# Patient Record
Sex: Male | Born: 1950 | Race: White | Hispanic: No | Marital: Married | Smoking: Never smoker
Health system: Southern US, Community
[De-identification: ages and names within clinical notes are randomized; demographics above are authoritative.]

## PROBLEM LIST (undated history)

## (undated) DIAGNOSIS — R7303 Prediabetes: Secondary | ICD-10-CM

## (undated) DIAGNOSIS — Z7901 Long term (current) use of anticoagulants: Secondary | ICD-10-CM

## (undated) DIAGNOSIS — I059 Rheumatic mitral valve disease, unspecified: Secondary | ICD-10-CM

## (undated) DIAGNOSIS — C801 Malignant (primary) neoplasm, unspecified: Secondary | ICD-10-CM

## (undated) DIAGNOSIS — J309 Allergic rhinitis, unspecified: Secondary | ICD-10-CM

## (undated) DIAGNOSIS — K219 Gastro-esophageal reflux disease without esophagitis: Secondary | ICD-10-CM

## (undated) DIAGNOSIS — M1990 Unspecified osteoarthritis, unspecified site: Secondary | ICD-10-CM

## (undated) DIAGNOSIS — I4949 Other premature depolarization: Secondary | ICD-10-CM

## (undated) DIAGNOSIS — Z952 Presence of prosthetic heart valve: Secondary | ICD-10-CM

## (undated) DIAGNOSIS — E785 Hyperlipidemia, unspecified: Secondary | ICD-10-CM

## (undated) DIAGNOSIS — I10 Essential (primary) hypertension: Secondary | ICD-10-CM

## (undated) DIAGNOSIS — E119 Type 2 diabetes mellitus without complications: Secondary | ICD-10-CM

## (undated) HISTORY — DX: Long term (current) use of anticoagulants: Z79.01

## (undated) HISTORY — DX: Hyperlipidemia, unspecified: E78.5

## (undated) HISTORY — DX: Rheumatic mitral valve disease, unspecified: I05.9

## (undated) HISTORY — PX: APPENDECTOMY: SHX54

## (undated) HISTORY — DX: Allergic rhinitis, unspecified: J30.9

## (undated) HISTORY — PX: MITRAL VALVE REPLACEMENT: SHX147

## (undated) HISTORY — PX: SHOULDER SURGERY: SHX246

## (undated) HISTORY — DX: Other premature depolarization: I49.49

---
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
## 1999-04-07 HISTORY — PX: MITRAL VALVE REPLACEMENT: SHX147

## 2005-11-15 ENCOUNTER — Ambulatory Visit: Payer: Self-pay | Admitting: Cardiovascular Disease

## 2005-11-16 ENCOUNTER — Ambulatory Visit: Payer: Self-pay | Admitting: Cardiology

## 2005-11-26 ENCOUNTER — Ambulatory Visit: Payer: Self-pay | Admitting: Cardiology

## 2005-12-17 ENCOUNTER — Ambulatory Visit: Payer: Self-pay | Admitting: Cardiology

## 2006-01-06 ENCOUNTER — Ambulatory Visit: Payer: Self-pay | Admitting: Cardiology

## 2006-01-27 ENCOUNTER — Ambulatory Visit: Payer: Self-pay | Admitting: Cardiology

## 2006-02-04 ENCOUNTER — Ambulatory Visit: Payer: Self-pay | Admitting: Cardiovascular Disease

## 2006-02-04 LAB — CONVERTED CEMR LAB
Albumin: 4.1 g/dL (ref 3.5–5.2)
Alkaline Phosphatase: 64 units/L (ref 39–117)
Chol/HDL Ratio, serum: 3.7
LDL Cholesterol: 72 mg/dL (ref 0–99)
Total Bilirubin: 0.8 mg/dL (ref 0.3–1.2)
Triglyceride fasting, serum: 148 mg/dL (ref 0–149)
VLDL: 30 mg/dL (ref 0–40)

## 2006-02-28 ENCOUNTER — Ambulatory Visit: Payer: Self-pay | Admitting: Cardiovascular Disease

## 2006-03-21 ENCOUNTER — Encounter (INDEPENDENT_AMBULATORY_CARE_PROVIDER_SITE_OTHER): Payer: Self-pay | Admitting: Family Medicine

## 2006-03-21 ENCOUNTER — Emergency Department (HOSPITAL_COMMUNITY): Admission: EM | Admit: 2006-03-21 | Discharge: 2006-03-21 | Payer: Self-pay | Admitting: Family Medicine

## 2006-03-23 ENCOUNTER — Encounter (INDEPENDENT_AMBULATORY_CARE_PROVIDER_SITE_OTHER): Payer: Self-pay | Admitting: Family Medicine

## 2006-03-24 ENCOUNTER — Ambulatory Visit: Payer: Self-pay | Admitting: Family Medicine

## 2006-03-28 ENCOUNTER — Encounter: Payer: Self-pay | Admitting: Family Medicine

## 2006-03-28 DIAGNOSIS — I10 Essential (primary) hypertension: Secondary | ICD-10-CM | POA: Insufficient documentation

## 2006-03-28 DIAGNOSIS — J309 Allergic rhinitis, unspecified: Secondary | ICD-10-CM | POA: Insufficient documentation

## 2006-03-28 DIAGNOSIS — I059 Rheumatic mitral valve disease, unspecified: Secondary | ICD-10-CM | POA: Insufficient documentation

## 2006-03-29 ENCOUNTER — Ambulatory Visit: Payer: Self-pay | Admitting: Cardiology

## 2006-03-31 ENCOUNTER — Ambulatory Visit: Payer: Self-pay | Admitting: Family Medicine

## 2006-04-08 ENCOUNTER — Ambulatory Visit: Payer: Self-pay | Admitting: Cardiology

## 2006-05-13 ENCOUNTER — Ambulatory Visit: Payer: Self-pay | Admitting: Cardiology

## 2006-06-03 ENCOUNTER — Ambulatory Visit: Payer: Self-pay | Admitting: Internal Medicine

## 2006-07-04 ENCOUNTER — Ambulatory Visit: Payer: Self-pay | Admitting: Cardiology

## 2006-08-05 ENCOUNTER — Ambulatory Visit: Payer: Self-pay | Admitting: Internal Medicine

## 2006-08-16 ENCOUNTER — Encounter (INDEPENDENT_AMBULATORY_CARE_PROVIDER_SITE_OTHER): Payer: Self-pay | Admitting: Family Medicine

## 2006-08-16 ENCOUNTER — Telehealth (INDEPENDENT_AMBULATORY_CARE_PROVIDER_SITE_OTHER): Payer: Self-pay | Admitting: Family Medicine

## 2006-08-19 ENCOUNTER — Ambulatory Visit: Payer: Self-pay | Admitting: Family Medicine

## 2006-08-19 ENCOUNTER — Telehealth (INDEPENDENT_AMBULATORY_CARE_PROVIDER_SITE_OTHER): Payer: Self-pay | Admitting: Family Medicine

## 2006-08-25 ENCOUNTER — Ambulatory Visit: Payer: Self-pay | Admitting: Cardiology

## 2006-09-05 ENCOUNTER — Ambulatory Visit: Payer: Self-pay | Admitting: Family Medicine

## 2006-09-05 DIAGNOSIS — E785 Hyperlipidemia, unspecified: Secondary | ICD-10-CM

## 2006-09-05 LAB — CONVERTED CEMR LAB
Cholesterol, target level: 200 mg/dL
HDL goal, serum: 40 mg/dL
LDL Goal: 130 mg/dL

## 2006-09-07 ENCOUNTER — Encounter (INDEPENDENT_AMBULATORY_CARE_PROVIDER_SITE_OTHER): Payer: Self-pay | Admitting: Family Medicine

## 2006-09-13 LAB — CONVERTED CEMR LAB
ALT: 20 units/L (ref 0–53)
AST: 21 units/L (ref 0–37)
Albumin: 4.3 g/dL (ref 3.5–5.2)
BUN: 22 mg/dL (ref 6–23)
Basophils Relative: 1 % (ref 0–1)
CO2: 27 meq/L (ref 19–32)
Calcium: 9.3 mg/dL (ref 8.4–10.5)
Chloride: 105 meq/L (ref 96–112)
HDL: 49 mg/dL (ref >39)
Lymphocytes Relative: 30 % (ref 12–46)
MCHC: 33 g/dL (ref 30.0–36.0)
Monocytes Relative: 8 % (ref 3–11)
Neutro Abs: 3.6 10*3/uL (ref 1.7–7.7)
Neutrophils Relative %: 57 % (ref 43–77)
PSA: 3.09 ng/mL (ref 0.10–4.00)
Potassium: 4.7 meq/L (ref 3.5–5.3)
RBC: 5.01 M/uL (ref 4.22–5.81)
TSH: 2.07 microintl units/mL (ref 0.350–5.50)
WBC: 6.3 10*3/uL (ref 4.0–10.5)

## 2006-09-26 ENCOUNTER — Encounter (INDEPENDENT_AMBULATORY_CARE_PROVIDER_SITE_OTHER): Payer: Self-pay | Admitting: Family Medicine

## 2006-09-28 ENCOUNTER — Ambulatory Visit: Payer: Self-pay | Admitting: Cardiology

## 2006-09-29 ENCOUNTER — Encounter (INDEPENDENT_AMBULATORY_CARE_PROVIDER_SITE_OTHER): Payer: Self-pay | Admitting: Family Medicine

## 2006-10-20 ENCOUNTER — Encounter (INDEPENDENT_AMBULATORY_CARE_PROVIDER_SITE_OTHER): Payer: Self-pay | Admitting: Family Medicine

## 2006-10-24 ENCOUNTER — Encounter (INDEPENDENT_AMBULATORY_CARE_PROVIDER_SITE_OTHER): Payer: Self-pay | Admitting: Family Medicine

## 2006-10-31 ENCOUNTER — Encounter (INDEPENDENT_AMBULATORY_CARE_PROVIDER_SITE_OTHER): Payer: Self-pay | Admitting: Family Medicine

## 2006-11-04 ENCOUNTER — Ambulatory Visit: Payer: Self-pay | Admitting: Cardiology

## 2006-11-17 ENCOUNTER — Ambulatory Visit: Payer: Self-pay | Admitting: Cardiovascular Disease

## 2006-11-21 ENCOUNTER — Encounter: Payer: Self-pay | Admitting: Cardiovascular Disease

## 2006-11-21 ENCOUNTER — Ambulatory Visit: Payer: Self-pay

## 2007-01-24 ENCOUNTER — Ambulatory Visit: Payer: Self-pay | Admitting: Cardiovascular Disease

## 2007-01-27 ENCOUNTER — Ambulatory Visit: Payer: Self-pay | Admitting: Internal Medicine

## 2007-02-06 ENCOUNTER — Telehealth (INDEPENDENT_AMBULATORY_CARE_PROVIDER_SITE_OTHER): Payer: Self-pay | Admitting: Family Medicine

## 2007-02-07 ENCOUNTER — Ambulatory Visit: Payer: Self-pay | Admitting: Family Medicine

## 2007-02-08 ENCOUNTER — Ambulatory Visit: Payer: Self-pay | Admitting: Cardiology

## 2007-02-22 ENCOUNTER — Ambulatory Visit: Payer: Self-pay | Admitting: Cardiology

## 2007-03-24 ENCOUNTER — Ambulatory Visit: Payer: Self-pay | Admitting: Cardiology

## 2007-04-17 ENCOUNTER — Ambulatory Visit: Payer: Self-pay | Admitting: Cardiology

## 2007-05-24 ENCOUNTER — Ambulatory Visit: Payer: Self-pay | Admitting: Cardiovascular Disease

## 2007-06-13 ENCOUNTER — Ambulatory Visit: Payer: Self-pay | Admitting: Cardiology

## 2007-07-04 ENCOUNTER — Ambulatory Visit: Payer: Self-pay | Admitting: Cardiology

## 2007-07-06 ENCOUNTER — Ambulatory Visit: Payer: Self-pay | Admitting: Family Medicine

## 2007-08-22 ENCOUNTER — Telehealth (INDEPENDENT_AMBULATORY_CARE_PROVIDER_SITE_OTHER): Payer: Self-pay | Admitting: *Deleted

## 2007-08-24 ENCOUNTER — Ambulatory Visit: Payer: Self-pay | Admitting: Cardiology

## 2007-09-22 ENCOUNTER — Encounter (INDEPENDENT_AMBULATORY_CARE_PROVIDER_SITE_OTHER): Payer: Self-pay | Admitting: Family Medicine

## 2007-09-27 LAB — CONVERTED CEMR LAB
BUN: 24 mg/dL — ABNORMAL HIGH (ref 6–23)
CO2: 24 meq/L (ref 19–32)
Calcium: 9.9 mg/dL (ref 8.4–10.5)
Chloride: 107 meq/L (ref 96–112)
Cholesterol: 135 mg/dL (ref 0–200)
Creatinine, Ser: 1.11 mg/dL (ref 0.40–1.50)
Glucose, Bld: 95 mg/dL (ref 70–99)
HDL: 47 mg/dL (ref >39)
Hemoglobin: 15.3 g/dL (ref 13.0–17.0)
Lymphocytes Relative: 17 % (ref 12–46)
Monocytes Absolute: 0.7 10*3/uL (ref 0.1–1.0)
Monocytes Relative: 8 % (ref 3–12)
Neutro Abs: 6.8 10*3/uL (ref 1.7–7.7)
PSA: 2.81 ng/mL (ref 0.10–4.00)
RBC: 5.22 M/uL (ref 4.22–5.81)
Total CHOL/HDL Ratio: 2.9

## 2007-10-10 ENCOUNTER — Ambulatory Visit: Payer: Self-pay | Admitting: Cardiology

## 2007-10-17 ENCOUNTER — Ambulatory Visit: Payer: Self-pay | Admitting: Cardiovascular Disease

## 2007-10-25 ENCOUNTER — Ambulatory Visit: Payer: Self-pay | Admitting: Family Medicine

## 2007-10-25 ENCOUNTER — Ambulatory Visit: Payer: Self-pay | Admitting: Cardiology

## 2007-10-25 LAB — CONVERTED CEMR LAB
Bilirubin Urine: NEGATIVE
Ketones, urine, test strip: NEGATIVE
WBC Urine, dipstick: NEGATIVE

## 2007-11-10 ENCOUNTER — Ambulatory Visit: Payer: Self-pay | Admitting: Cardiology

## 2007-11-27 ENCOUNTER — Emergency Department (HOSPITAL_COMMUNITY): Admission: EM | Admit: 2007-11-27 | Discharge: 2007-11-27 | Payer: Self-pay | Admitting: Emergency Medicine

## 2007-11-28 ENCOUNTER — Telehealth: Payer: Self-pay | Admitting: Family Medicine

## 2008-01-25 ENCOUNTER — Ambulatory Visit: Payer: Self-pay | Admitting: Cardiology

## 2008-02-08 ENCOUNTER — Ambulatory Visit: Payer: Self-pay | Admitting: Cardiovascular Disease

## 2008-03-07 ENCOUNTER — Ambulatory Visit: Payer: Self-pay | Admitting: Cardiology

## 2008-03-13 ENCOUNTER — Ambulatory Visit: Payer: Self-pay | Admitting: Cardiology

## 2008-04-04 ENCOUNTER — Ambulatory Visit: Payer: Self-pay | Admitting: Cardiology

## 2008-04-24 ENCOUNTER — Ambulatory Visit: Payer: Self-pay | Admitting: Family Medicine

## 2008-04-25 ENCOUNTER — Ambulatory Visit: Payer: Self-pay | Admitting: Cardiology

## 2008-05-02 ENCOUNTER — Encounter (INDEPENDENT_AMBULATORY_CARE_PROVIDER_SITE_OTHER): Payer: Self-pay | Admitting: Family Medicine

## 2008-05-16 ENCOUNTER — Ambulatory Visit: Payer: Self-pay | Admitting: Cardiology

## 2008-05-16 ENCOUNTER — Encounter (INDEPENDENT_AMBULATORY_CARE_PROVIDER_SITE_OTHER): Payer: Self-pay | Admitting: Family Medicine

## 2008-05-21 ENCOUNTER — Encounter (INDEPENDENT_AMBULATORY_CARE_PROVIDER_SITE_OTHER): Payer: Self-pay | Admitting: *Deleted

## 2008-05-21 LAB — CONVERTED CEMR LAB
BUN: 25 mg/dL — ABNORMAL HIGH (ref 6–23)
Basophils Relative: 0 % (ref 0–1)
CO2: 25 meq/L (ref 19–32)
Calcium: 9.3 mg/dL (ref 8.4–10.5)
Chloride: 105 meq/L (ref 96–112)
Cholesterol: 140 mg/dL (ref 0–200)
Creatinine, Ser: 0.96 mg/dL (ref 0.40–1.50)
Glucose, Bld: 113 mg/dL — ABNORMAL HIGH (ref 70–99)
HDL: 49 mg/dL (ref >39)
Hemoglobin: 15.7 g/dL (ref 13.0–17.0)
Lymphocytes Relative: 28 % (ref 12–46)
Lymphs Abs: 1.9 10*3/uL (ref 0.7–4.0)
MCHC: 32 g/dL (ref 30.0–36.0)
Monocytes Absolute: 0.5 10*3/uL (ref 0.1–1.0)
Monocytes Relative: 7 % (ref 3–12)
Neutro Abs: 4 10*3/uL (ref 1.7–7.7)
RBC: 5.44 M/uL (ref 4.22–5.81)
Total CHOL/HDL Ratio: 2.9
Triglycerides: 137 mg/dL (ref <150)

## 2008-06-20 ENCOUNTER — Ambulatory Visit: Payer: Self-pay | Admitting: Cardiology

## 2008-07-18 ENCOUNTER — Ambulatory Visit: Payer: Self-pay | Admitting: Cardiology

## 2008-08-07 DIAGNOSIS — I4949 Other premature depolarization: Secondary | ICD-10-CM

## 2008-08-22 ENCOUNTER — Ambulatory Visit: Payer: Self-pay | Admitting: Cardiology

## 2008-09-23 ENCOUNTER — Ambulatory Visit: Payer: Self-pay | Admitting: Cardiovascular Disease

## 2008-09-23 DIAGNOSIS — Z954 Presence of other heart-valve replacement: Secondary | ICD-10-CM | POA: Insufficient documentation

## 2008-11-18 ENCOUNTER — Encounter: Payer: Self-pay | Admitting: Cardiology

## 2008-11-20 ENCOUNTER — Ambulatory Visit: Payer: Self-pay

## 2008-11-20 ENCOUNTER — Encounter: Payer: Self-pay | Admitting: Cardiology

## 2008-12-02 ENCOUNTER — Encounter: Payer: Self-pay | Admitting: *Deleted

## 2008-12-19 ENCOUNTER — Ambulatory Visit: Payer: Self-pay | Admitting: Cardiology

## 2009-01-07 ENCOUNTER — Telehealth (INDEPENDENT_AMBULATORY_CARE_PROVIDER_SITE_OTHER): Payer: Self-pay | Admitting: *Deleted

## 2009-01-08 ENCOUNTER — Encounter (INDEPENDENT_AMBULATORY_CARE_PROVIDER_SITE_OTHER): Payer: Self-pay | Admitting: Family Medicine

## 2009-01-17 ENCOUNTER — Encounter (INDEPENDENT_AMBULATORY_CARE_PROVIDER_SITE_OTHER): Payer: Self-pay | Admitting: Cardiology

## 2009-02-07 ENCOUNTER — Ambulatory Visit: Payer: Self-pay | Admitting: Cardiology

## 2009-03-06 ENCOUNTER — Ambulatory Visit: Payer: Self-pay | Admitting: Cardiology

## 2009-05-07 ENCOUNTER — Encounter (INDEPENDENT_AMBULATORY_CARE_PROVIDER_SITE_OTHER): Payer: Self-pay | Admitting: Cardiology

## 2009-05-21 ENCOUNTER — Encounter (INDEPENDENT_AMBULATORY_CARE_PROVIDER_SITE_OTHER): Payer: Self-pay | Admitting: Cardiology

## 2009-05-21 ENCOUNTER — Telehealth: Payer: Self-pay | Admitting: Cardiology

## 2009-05-29 ENCOUNTER — Ambulatory Visit: Payer: Self-pay | Admitting: Cardiology

## 2009-05-29 LAB — CONVERTED CEMR LAB: POC INR: 2.8

## 2009-07-03 ENCOUNTER — Ambulatory Visit: Payer: Self-pay | Admitting: Cardiology

## 2009-07-03 LAB — CONVERTED CEMR LAB: POC INR: 2.3

## 2009-07-31 ENCOUNTER — Encounter (INDEPENDENT_AMBULATORY_CARE_PROVIDER_SITE_OTHER): Payer: Self-pay | Admitting: *Deleted

## 2009-08-04 ENCOUNTER — Encounter (INDEPENDENT_AMBULATORY_CARE_PROVIDER_SITE_OTHER): Payer: Self-pay | Admitting: *Deleted

## 2009-08-13 ENCOUNTER — Encounter (INDEPENDENT_AMBULATORY_CARE_PROVIDER_SITE_OTHER): Payer: Self-pay | Admitting: Pharmacist

## 2009-09-08 ENCOUNTER — Ambulatory Visit: Payer: Self-pay | Admitting: Cardiology

## 2009-09-08 LAB — CONVERTED CEMR LAB: POC INR: 2.5

## 2009-09-30 ENCOUNTER — Ambulatory Visit: Payer: Self-pay | Admitting: Cardiovascular Disease

## 2009-10-06 ENCOUNTER — Ambulatory Visit: Payer: Self-pay | Admitting: Cardiovascular Disease

## 2009-11-05 ENCOUNTER — Telehealth (INDEPENDENT_AMBULATORY_CARE_PROVIDER_SITE_OTHER): Payer: Self-pay

## 2009-11-05 ENCOUNTER — Ambulatory Visit: Payer: Self-pay | Admitting: Cardiology

## 2009-11-05 LAB — CONVERTED CEMR LAB: POC INR: 3

## 2009-12-10 ENCOUNTER — Ambulatory Visit: Payer: Self-pay | Admitting: Cardiology

## 2009-12-10 LAB — CONVERTED CEMR LAB: POC INR: 2.7

## 2010-01-07 ENCOUNTER — Ambulatory Visit: Payer: Self-pay | Admitting: Cardiology

## 2010-02-04 ENCOUNTER — Ambulatory Visit: Payer: Self-pay | Admitting: Cardiology

## 2010-03-16 ENCOUNTER — Ambulatory Visit: Payer: Self-pay | Admitting: Cardiology

## 2010-04-22 ENCOUNTER — Ambulatory Visit: Admit: 2010-04-22 | Payer: Self-pay

## 2010-04-23 ENCOUNTER — Encounter (INDEPENDENT_AMBULATORY_CARE_PROVIDER_SITE_OTHER): Payer: Self-pay | Admitting: *Deleted

## 2010-05-01 ENCOUNTER — Ambulatory Visit
Admission: RE | Admit: 2010-05-01 | Discharge: 2010-05-01 | Payer: Self-pay | Source: Home / Self Care | Attending: Cardiology | Admitting: Cardiology

## 2010-05-19 NOTE — Letter (Signed)
Summary: Custom - Delinquent Coumadin 1  [facility]  [address] [place] [address]   Phone: [phone]  Fax: [phone]     August 13, 2009 MRN: [id number]   Luis Haynes [address] [place], [place]  [address]   Dear [name],  This letter is being sent to you as a reminder that it is necessary for you to get your INR/PT checked regularly so that we can optimize your care.  Our records indicate that you were scheduled to have a test done recently.  As of today, we have not received the results of this test.  It is very important that you have your INR checked.  Please call our office at the number listed above to schedule an appointment at your earliest convenience.    If you have recently had your protime checked or have discontinued this medication, please contact our office at the above phone number to clarify this issue.  Thank you for this prompt attention to this important health care matter.  Sincerely, Vashti Hey, RN  [facility] Team

## 2010-05-19 NOTE — Medication Information (Signed)
Summary: ccr-lr  Anticoagulant Therapy  Managed by: Vashti Hey, RN PCP: Franchot Heidelberg, MD Supervising MD: Diona Browner MD, Remi Deter Indication 1: Mitral Valve Replacement (ICD-V43.3) Indication 2: Atrial Fibrillation (ICD-427.31) Lab Used: [facility] [place] Site: [place] INR POC 3.1  Dietary changes: no    Health status changes: no    Bleeding/hemorrhagic complications: no    Recent/future hospitalizations: no    Any changes in medication regimen? no    Recent/future dental: no  Any missed doses?: no       Is patient compliant with meds? yes       Allergies: 1)  ! Sulfa  Anticoagulation Management History:      The patient is taking warfarin and comes in today for a routine follow up visit.  Negative risk factors for bleeding include an age less than 56 years old and no history of CVA/TIA.  The bleeding index is 'low risk'.  Positive CHADS2 values include History of HTN.  Negative CHADS2 values include Age > 14 years old, History of Diabetes, and Prior Stroke/CVA/TIA.  The start date was 04/19/1998.  Anticoagulation responsible provider: Diona Browner MD, Remi Deter.  INR POC: 3.1.  Cuvette Lot#: [id number].  Exp: 11/2010.    Anticoagulation Management Assessment/Plan:      The patient's current anticoagulation dose is Coumadin 5 mg tabs: As directed.  The target INR is 2.5 - 3.5.  The next INR is due 03/09/2010.  Anticoagulation instructions were given to patient.  Results were reviewed/authorized by Vashti Hey, RN.  He was notified by Vashti Hey RN.         Prior Anticoagulation Instructions: INR 2.7 Continue coumadin 5mg  once daily except 7.5mg  on Mondays, Wednesdays and Fridays  Current Anticoagulation Instructions: INR 3.1 Continue coumadin 5mg  once daily except 7.5mg  on Mondays, Wednesdays and Fridays

## 2010-05-19 NOTE — Progress Notes (Signed)
Summary: called re. past due coumadin appt  Phone Note Outgoing Call   Call placed by: Vashti Hey RN Call placed to: Patient Summary of Call: Called pt re. past due INR appts.  No answer.  Left message on machine for pt to call the office to let me know if he is still taking coumadin and who is managing it if he is. Initial call taken by: Vashti Hey RN,  May 21, 2009 1:07 PM

## 2010-05-19 NOTE — Letter (Signed)
Summary: Custom - Delinquent Coumadin 1  [facility]  [address] [place] [address]   Phone: [phone]  Fax: [phone]     May 07, 2009 MRN: [id number]   Luis Haynes [address] [place], [place]  [address]   Dear Mr. VOSLER,  This letter is being sent to you as a reminder that it is necessary for you to get your INR/PT checked regularly so that we can optimize your care.  Our records indicate that you were scheduled to have a test done recently.  As of today, we have not received the results of this test.  It is very important that you have your INR checked.  Please call our office at the number listed above to schedule an appointment at your earliest convenience.    If you have recently had your protime checked or have discontinued this medication, please contact our office at the above phone number to clarify this issue.  Thank you for this prompt attention to this important health care matter.  Sincerely, Vashti Hey RN  [facility] Team   Please let our office know if you are no longer taking coumadin or if it is being managed by another physican so we can update our records.

## 2010-05-19 NOTE — Letter (Signed)
Summary: Appointment - Missed  [facility]  [address] [place] [address]   Phone: [phone]  Fax: [phone]     August 04, 2009 MRN: [id number]   Luis Haynes [address] [place], [place]  [address]   Dear [name],  Our records indicate you missed your appointment on  08/04/09    [facility]     It is very important that we reach you to reschedule this appointment. We look forward to participating in your health care needs. Please contact us at the number listed above at your earliest convenience to reschedule this appointment.     Sincerely,    Glass blower/designer

## 2010-05-19 NOTE — Medication Information (Signed)
Summary: ccr-lr  Anticoagulant Therapy  Managed by: Vashti Hey, RN PCP: Franchot Heidelberg, MD Supervising MD: Diona Browner MD, Remi Deter Indication 1: Mitral Valve Replacement (ICD-V43.3) Indication 2: Atrial Fibrillation (ICD-427.31) Lab Used: [facility] [place] Site: [place] INR POC 2.7  Dietary changes: no    Health status changes: no    Bleeding/hemorrhagic complications: no    Recent/future hospitalizations: no    Any changes in medication regimen? no    Recent/future dental: no  Any missed doses?: no       Is patient compliant with meds? yes       Allergies: 1)  ! Sulfa  Anticoagulation Management History:      The patient is taking warfarin and comes in today for a routine follow up visit.  Negative risk factors for bleeding include an age less than 56 years old and no history of CVA/TIA.  The bleeding index is 'low risk'.  Positive CHADS2 values include History of HTN.  Negative CHADS2 values include Age > 1 years old, History of Diabetes, and Prior Stroke/CVA/TIA.  The start date was 04/19/1998.  Anticoagulation responsible Jakobe Blau: Diona Browner MD, Remi Deter.  INR POC: 2.7.  Cuvette Lot#: [id number].  Exp: 11/2010.    Anticoagulation Management Assessment/Plan:      The patient's current anticoagulation dose is Coumadin 5 mg tabs: As directed.  The target INR is 2.5 - 3.5.  The next INR is due 02/04/2010.  Anticoagulation instructions were given to patient.  Results were reviewed/authorized by Vashti Hey, RN.  He was notified by Vashti Hey RN.         Prior Anticoagulation Instructions: INR 2.7 Continue coumadin 5mg  once daily except 7.5mg  on Mondays, Wednesdays and Fridays  Current Anticoagulation Instructions: Same as Prior Instructions. Prescriptions: COUMADIN 5 MG TABS (WARFARIN SODIUM) As directed  #45 x 3   Entered by:   Vashti Hey RN   Authorized by:   Colon Branch, MD, [facility]   Signed by:   Vashti Hey RN on 01/07/2010   Method  used:   Electronically to        [facility] (retail)       [address]       [place], [place]  [address]       Ph: [phone]       Fax: [phone]   RxID:   [id number]

## 2010-05-19 NOTE — Medication Information (Signed)
Summary: CCR  Anticoagulant Therapy  Managed by: Vashti Hey, RN PCP: Franchot Heidelberg, MD Supervising MD: Diona Browner MD, Remi Deter Indication 1: Mitral Valve Replacement (ICD-V43.3) Indication 2: Atrial Fibrillation (ICD-427.31) Lab Used: [facility] [place] Site: [place] INR POC 2.8  Dietary changes: no    Health status changes: no    Bleeding/hemorrhagic complications: no    Recent/future hospitalizations: no    Any changes in medication regimen? no    Recent/future dental: no  Any missed doses?: no       Is patient compliant with meds? yes       Allergies: No Known Drug Allergies  Anticoagulation Management History:      The patient is taking warfarin and comes in today for a routine follow up visit.  Negative risk factors for bleeding include an age less than 81 years old and no history of CVA/TIA.  The bleeding index is 'low risk'.  Positive CHADS2 values include History of HTN.  Negative CHADS2 values include Age > 72 years old, History of Diabetes, and Prior Stroke/CVA/TIA.  The start date was 04/19/1998.  Anticoagulation responsible provider: Diona Browner MD, Remi Deter.  INR POC: 2.8.  Cuvette Lot#: [id number].  Exp: 10/11.    Anticoagulation Management Assessment/Plan:      The patient's current anticoagulation dose is Coumadin 5 mg tabs: As directed by [facility] 7.5  two days a week and 5mg  5 days.  The target INR is 2.5 - 3.5.  The next INR is due 07/03/2009.  Anticoagulation instructions were given to patient.  Results were reviewed/authorized by Vashti Hey, RN.  He was notified by Vashti Hey RN.         Prior Anticoagulation Instructions: INR 2.4 Take coumadin 1 1/2 tablets today then resume 1 tablet once daily except 1 1/2 tablets on Mondays and Fridays  Current Anticoagulation Instructions: INR 2.8 Continue coumadin 5mg  once daily except 7.5mg  on Mondays and Fridays

## 2010-05-19 NOTE — Letter (Signed)
Summary: Custom - Delinquent Coumadin 2  [facility]  [address] [place] [address]   Phone: [phone]  Fax: [phone]     May 21, 2009 MRN: [id number]   Luis Haynes [address] [place], [place]  [address]   Dear [name],  We have attempted to contact you by phone and letter on multiple occasions to contact our office for important blood work associated with the blood thinner, warfarin (Coumadin).  Warfarin is a very important drug that can cause life threatening side effects including, bleeding, and thus requires close laboratory monitoring.  We are unable to accept responsibility for blood thinner-related health problems you may develop because you have not followed our recommendations for appropriate monitoring.  These may include abnormal bleeding occurrences and/or development of blood clots (stroke, heart attack, blood clots in legs or lungs, etc.).  We need for you to contact this office at the number listed above to schedule and complete this very important blood work.  Thank you for your assistance in this urgent matter.  Sincerely, Vashti Hey RN [facility] Team

## 2010-05-19 NOTE — Medication Information (Signed)
Summary: ccr-lr  Anticoagulant Therapy  Managed by: Vashti Hey, RN PCP: Franchot Heidelberg, MD Supervising MD: Dietrich Pates MD, Molly Maduro Indication 1: Mitral Valve Replacement (ICD-V43.3) Indication 2: Atrial Fibrillation (ICD-427.31) Lab Used: [facility] [place] Site: [place] INR POC 2.3  Dietary changes: no    Health status changes: no    Bleeding/hemorrhagic complications: no    Recent/future hospitalizations: no    Any changes in medication regimen? no    Recent/future dental: no  Any missed doses?: no       Is patient compliant with meds? yes       Allergies: No Known Drug Allergies  Anticoagulation Management History:      The patient is taking warfarin and comes in today for a routine follow up visit.  Negative risk factors for bleeding include an age less than 49 years old and no history of CVA/TIA.  The bleeding index is 'low risk'.  Positive CHADS2 values include History of HTN.  Negative CHADS2 values include Age > 70 years old, History of Diabetes, and Prior Stroke/CVA/TIA.  The start date was 04/19/1998.  Anticoagulation responsible provider: Dietrich Pates MD, Molly Maduro.  INR POC: 2.3.  Cuvette Lot#: [id number].  Exp: 10/11.    Anticoagulation Management Assessment/Plan:      The patient's current anticoagulation dose is Coumadin 5 mg tabs: As directed by [facility]in Clinic 7.5  two days a week and 5mg  5 days.  The target INR is 2.5 - 3.5.  The next INR is due 08/04/2009.  Anticoagulation instructions were given to patient.  Results were reviewed/authorized by Vashti Hey, RN.  He was notified by Vashti Hey RN.         Prior Anticoagulation Instructions: INR 2.8 Continue coumadin 5mg  once daily except 7.5mg  on Mondays and Fridays  Current Anticoagulation Instructions: INR 2.3 Take coumadin 2 tablets tonight and tomorrow night then resume 1 tablet once daily except 1 1/2 on Mondays and Fridays

## 2010-05-19 NOTE — Medication Information (Signed)
Summary: rov/sp   Anticoagulant Therapy  Managed by: Vashti Hey, RN PCP: Franchot Heidelberg, MD Supervising MD: Daleen Squibb MD, Maisie Fus Indication 1: Mitral Valve Replacement (ICD-V43.3) Indication 2: Atrial Fibrillation (ICD-427.31) Lab Used: [facility] [place] Site: [place] INR POC 3.0  Dietary changes: no    Health status changes: no    Bleeding/hemorrhagic complications: no    Recent/future hospitalizations: no    Any changes in medication regimen? no    Recent/future dental: no  Any missed doses?: no       Is patient compliant with meds? yes       Allergies: 1)  ! Sulfa  Anticoagulation Management History:      The patient is taking warfarin and comes in today for a routine follow up visit.  Negative risk factors for bleeding include an age less than 66 years old and no history of CVA/TIA.  The bleeding index is 'low risk'.  Positive CHADS2 values include History of HTN.  Negative CHADS2 values include Age > 11 years old, History of Diabetes, and Prior Stroke/CVA/TIA.  The start date was 04/19/1998.  Anticoagulation responsible provider: Daleen Squibb MD, Maisie Fus.  INR POC: 3.0.  Cuvette Lot#: [id number].  Exp: 11/2010.    Anticoagulation Management Assessment/Plan:      The patient's current anticoagulation dose is Coumadin 5 mg tabs: As directed.  The target INR is 2.5 - 3.5.  The next INR is due 12/10/2009.  Anticoagulation instructions were given to patient.  Results were reviewed/authorized by Vashti Hey, RN.  He was notified by Vashti Hey RN.         Prior Anticoagulation Instructions: INR 3.5  Continue same dose of 1 tablet every day except 1 1/2 tablets on Monday, Wednesday and Friday.  Eat an extra serving of greens today.   Current Anticoagulation Instructions: INR 3.0 Continue coumadin 5mg  once daily except 7.5mg  on Mondays, Wednesdays and Fridays

## 2010-05-19 NOTE — Medication Information (Signed)
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 no    Recent/future dental: no  Any missed doses?: no       Is patient compliant with meds? yes       Allergies: 1)  ! Sulfa  Anticoagulation Management History:      The patient is taking warfarin and comes in today for a routine follow up visit.  Negative risk factors for bleeding include an age less than 52 years old and no history of CVA/TIA.  The bleeding index is 'low risk'.  Positive CHADS2 values include History of HTN.  Negative CHADS2 values include Age > 31 years old, History of Diabetes, and Prior Stroke/CVA/TIA.  The start date was 04/19/1998.  Anticoagulation responsible provider: Diona Browner MD, Remi Deter.  INR POC: 2.7.  Cuvette Lot#: [id number].  Exp: 11/2010.    Anticoagulation Management Assessment/Plan:      The patient's current anticoagulation dose is Coumadin 5 mg tabs: As directed.  The target INR is 2.5 - 3.5.  The next INR is due 01/07/2010.  Anticoagulation instructions were given to patient.  Results were reviewed/authorized by Vashti Hey, RN.  He was notified by Vashti Hey RN.         Prior Anticoagulation Instructions: INR 3.0 Continue coumadin 5mg  once daily except 7.5mg  on Mondays, Wednesdays and Fridays  Current Anticoagulation Instructions: INR 2.7 Continue coumadin 5mg  once daily except 7.5mg  on Mondays, Wednesdays and Fridays

## 2010-05-19 NOTE — Medication Information (Signed)
Summary: CCR  Anticoagulant Therapy  Managed by: Vashti Hey, RN PCP: Franchot Heidelberg, MD Supervising MD: Dietrich Pates MD, Molly Maduro Indication 1: Mitral Valve Replacement (ICD-V43.3) Indication 2: Atrial Fibrillation (ICD-427.31) Lab Used: [facility] [place] Site: [place] INR POC 2.5  Dietary changes: no    Health status changes: no    Bleeding/hemorrhagic complications: no    Recent/future hospitalizations: no    Any changes in medication regimen? no    Recent/future dental: no  Any missed doses?: no       Is patient compliant with meds? yes       Allergies: No Known Drug Allergies  Anticoagulation Management History:      Negative risk factors for bleeding include an age less than 55 years old and no history of CVA/TIA.  The bleeding index is 'low risk'.  Positive CHADS2 values include History of HTN.  Negative CHADS2 values include Age > 61 years old, History of Diabetes, and Prior Stroke/CVA/TIA.  The start date was 04/19/1998.  Anticoagulation responsible provider: Dietrich Pates MD, Molly Maduro.  INR POC: 2.5.  Exp: 10/11.    Anticoagulation Management Assessment/Plan:      The patient's current anticoagulation dose is Coumadin 5 mg tabs: As directed by Coumadin Clinic 7.5  two days a week and 5mg  5 days.  The target INR is 2.5 - 3.5.  The next INR is due 10/06/2009.  Anticoagulation instructions were given to patient.  Results were reviewed/authorized by Vashti Hey, RN.  He was notified by Vashti Hey RN.         Prior Anticoagulation Instructions: INR 2.3 Take coumadin 2 tablets tonight and tomorrow night then resume 1 tablet once daily except 1 1/2 on Mondays and Fridays  Current Anticoagulation Instructions: INR 2.5 Increase coumadin to 5mg  once daily except 7.5mg  on Mondays, Wednesdays and Fridays

## 2010-05-19 NOTE — Medication Information (Signed)
Summary: ccr-lr  Anticoagulant Therapy  Managed by: Vashti Hey, RN PCP: Franchot Heidelberg, MD Supervising MD: Diona Browner MD, Remi Deter Indication 1: Mitral Valve Replacement (ICD-V43.3) Indication 2: Atrial Fibrillation (ICD-427.31) Lab Used: [facility] [place] Site: [place] INR POC 3.4  Dietary changes: no    Health status changes: no    Bleeding/hemorrhagic complications: no    Recent/future hospitalizations: no    Any changes in medication regimen? no    Recent/future dental: no  Any missed doses?: no       Is patient compliant with meds? yes       Allergies: 1)  ! Sulfa  Anticoagulation Management History:      The patient is taking warfarin and comes in today for a routine follow up visit.  Negative risk factors for bleeding include an age less than 42 years old and no history of CVA/TIA.  The bleeding index is 'low risk'.  Positive CHADS2 values include History of HTN.  Negative CHADS2 values include Age > 59 years old, History of Diabetes, and Prior Stroke/CVA/TIA.  The start date was 04/19/1998.  Anticoagulation responsible provider: Diona Browner MD, Remi Deter.  INR POC: 3.4.  Cuvette Lot#: [id number].  Exp: 11/2010.    Anticoagulation Management Assessment/Plan:      The patient's current anticoagulation dose is Coumadin 5 mg tabs: As directed.  The target INR is 2.5 - 3.5.  The next INR is due 04/22/2010.  Anticoagulation instructions were given to patient.  Results were reviewed/authorized by Vashti Hey, RN.  He was notified by Vashti Hey RN.         Prior Anticoagulation Instructions: INR 3.1 Continue coumadin 5mg  once daily except 7.5mg  on Mondays, Wednesdays and Fridays   Current Anticoagulation Instructions: INR 3.4 Continue coumadin 5mg  once daily except 7.5mg  on Mondays, Wednesdays and Fridays

## 2010-05-19 NOTE — Medication Information (Signed)
Summary: ccr-lr  Anticoagulant Therapy  Managed by: Weston Brass, PharmD PCP: Franchot Heidelberg, MD Supervising MD: Eden Emms MD, Theron Arista Indication 1: Mitral Valve Replacement (ICD-V43.3) Indication 2: Atrial Fibrillation (ICD-427.31) Lab Used: [facility] [place] Site: [place] INR POC 3.5  Dietary changes: no    Health status changes: no    Bleeding/hemorrhagic complications: no    Recent/future hospitalizations: no    Any changes in medication regimen? no    Recent/future dental: no  Any missed doses?: no       Is patient compliant with meds? yes       Allergies: 1)  ! Sulfa  Anticoagulation Management History:      The patient is taking warfarin and comes in today for a routine follow up visit.  Negative risk factors for bleeding include an age less than 38 years old and no history of CVA/TIA.  The bleeding index is 'low risk'.  Positive CHADS2 values include History of HTN.  Negative CHADS2 values include Age > 68 years old, History of Diabetes, and Prior Stroke/CVA/TIA.  The start date was 04/19/1998.  Anticoagulation responsible provider: Eden Emms MD, Theron Arista.  INR POC: 3.5.  Cuvette Lot#: [id number].  Exp: 11/2010.    Anticoagulation Management Assessment/Plan:      The patient's current anticoagulation dose is Coumadin 5 mg tabs: As directed.  The target INR is 2.5 - 3.5.  The next INR is due 11/05/2009.  Anticoagulation instructions were given to patient.  Results were reviewed/authorized by Weston Brass, PharmD.  He was notified by Weston Brass PharmD.         Prior Anticoagulation Instructions: INR 2.5 Increase coumadin to 5mg  once daily except 7.5mg  on Mondays, Wednesdays and Fridays  Current Anticoagulation Instructions: INR 3.5  Continue same dose of 1 tablet every day except 1 1/2 tablets on Monday, Wednesday and Friday.  Eat an extra serving of greens today.

## 2010-05-19 NOTE — Letter (Signed)
Summary: Appointment - Reminder 2  [facility], Main Office  [address]   [place], [place] [address]   Phone: [phone]  Fax: [phone]     July 31, 2009 MRN: [id number]   SUHAYB ANZALONE [address] [place], [place]  [address]   Dear Mr. ROSTEN,  Our records indicate that it is time to schedule a follow-up appointment with Dr. Eden Emms. It is very important that we reach you to schedule this appointment. We look forward to participating in your health care needs. Please contact us at the number listed above at your earliest convenience to schedule your appointment.  If you are unable to make an appointment at this time, give [place] a call so we can update our records.     Sincerely,   Migdalia Dk [facility] Scheduling Team

## 2010-05-19 NOTE — Progress Notes (Signed)
**Note De-Identified Gala Padovano Obfuscation** Summary: RX REFILL   Phone Note Call from Patient Call back at Home Phone [phone]   Caller: PT Reason for Call: Refill Medication Summary of Call: PT NEED WARFRIN 5 MG CALL IN TO [facility] IN [place] IS ALMOST OUT NEEDS ASASP Initial call taken by: Faythe Ghee,  November 05, 2009 3:49 PM    Prescriptions: COUMADIN 5 MG TABS (WARFARIN SODIUM) As directed  #45 x 2   Entered by:   Larita Fife Adriella Essex LPN   Authorized by:   Colon Branch, MD, [facility]   Signed by:   Larita Fife Demian Maisel LPN on 87/56/4332   Method used:   Electronically to        [facility] (retail)       [address]       [place], [place]  [address]       Ph: [phone]       Fax: [phone]   RxID:   [id number]

## 2010-05-19 NOTE — Assessment & Plan Note (Signed)
Summary: 1 yr f/u  Medications Added COUMADIN 5 MG TABS (WARFARIN SODIUM) As directed      Allergies Added: ! SULFA  Visit Type:  Follow-up Primary Provider:  Franchot Heidelberg, MD   History of Present Illness: Luis Haynes is seen today in followup for his hypertension and aortic valve replacement.  His last echocardiogram in 2008 showed no significant LV cavity dilatation his EF was normal there was no periprostatic leaking he had a mean gradient of only 4 mm mercury across the valve.  He is due to have his Coumadin checked today.  He has been therapeutic in general.  There's been no bleeding diathesis.  He's been following his SBE prophylaxis.  He's been compliant with his blood pressure medication.  He wants to lose about 10 pounds.  When over low carbohydrate diet with him.  However he does like to eat pasta.  Otherwise he's not had any significant chest pain PND or orthopnea.  Has been no TIA or CVA.  Has been no signs of heart failure no lower extremity edema. I discussed missing some coumadin appt and changing to Pradaxa but he indicated that his schedule at [facility] just got too hectic and he doesn't think following his INR will be an issue in the future  Current Problems (verified): 1)  Aortic Valve Replacement, Hx of  (ICD-V43.3) 2)  Premature Ventricular Contractions  (ICD-427.69) 3)  Hypertension  (ICD-401.9) 4)  Hyperlipidemia  (ICD-272.4) 5)  Well Adult  (ICD-V70.0) 6)  Anticoagulation Therapy  (ICD-V58.61) 7)  Mitral Valve Prolapse  (ICD-424.0) 8)  Allergic Rhinitis  (ICD-477.9)  Current Medications (verified): 1)  Coumadin 5 Mg Tabs (Warfarin Sodium) .... As Directed 2)  Vytorin 10-20 Mg Tabs (Ezetimibe-Simvastatin) .... Once Daily 3)  Aspirin 81 Mg Tbec (Aspirin) .... Once Daily 4)  Lisinopril 40 Mg  Tabs (Lisinopril) .... One Daily 5)  Fish Oil   Oil (Fish Oil) .... 1000mg  Two Times A Day  Allergies (verified): 1)  ! Sulfa  Past History:  Past Medical History: Last  updated: 08/07/2008 Current Problems:  PREMATURE VENTRICULAR CONTRACTIONS (ICD-427.69) HYPERTENSION (ICD-401.9) HYPERLIPIDEMIA (ICD-272.4) WELL ADULT (ICD-V70.0) ANTICOAGULATION THERAPY (ICD-V58.61) MITRAL VALVE PROLAPSE (ICD-424.0) ALLERGIC RHINITIS (ICD-477.9)  mitral valve prolapse   Past Surgical History: Last updated: 08/07/2008 Appendectomy mitral valve replacement and be with the St. Jude valve in 2001.  orthopedic Procedure  Left shoulder surgery in 1979.  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   Vital Signs:  Patient profile:   59 year old male Height:      73 inches Weight:      203 pounds BMI:     26.88 Pulse rate:   91 / minute BP sitting:   122 / 92  (left arm)  Vitals Entered By: Laurance Flatten CMA (September 30, 2009 4:13 PM)  Physical Exam  General:  Affect appropriate Healthy:  appears stated age HEENT: normal Neck supple with no adenopathy JVP normal no bruits no thyromegaly Lungs clear with no wheezing and good diaphragmatic motion Heart:  S1/S2clik SEM no rub, gallop or click PMI normal Abdomen: benighn, BS positve,  no tenderness, no AAA no bruit.  No HSM or HJR Distal pulses intact with no bruits No edema Neuro non-focal Skin warm and dry    Impression & Recommendations:  Problem # 1:  AORTIC VALVE REPLACEMENT, HX OF (ICD-V43.3) No symptoms and normal exam with  no AR. Continue SBE prophylaxis  Problem # 2:  HYPERTENSION (ICD-401.9) Well controlled His updated medication list  for this problem includes:    Aspirin 81 Mg Tbec (Aspirin) ..... Once daily    Lisinopril 40 Mg Tabs (Lisinopril) ..... One daily  Problem # 3:  HYPERLIPIDEMIA (ICD-272.4) At goal with no side effects His updated medication list for this problem includes:    Vytorin 10-20 Mg Tabs (Ezetimibe-simvastatin) ..... Once daily  CHOL: 140 (05/16/2008)   LDL: 64 (05/16/2008)   HDL: 49 (05/16/2008)   TG: 137 (05/16/2008) CHOL (goal): 200 (09/05/2006)   LDL (goal): 130 (09/05/2006)   HDL (goal): 40 (09/05/2006)   TG (goal): 150 (09/05/2006)  Problem # 4:  ANTICOAGULATION THERAPY (ICD-V58.61) Continue F/U clinic in [place].  Consdier changng to Pradaxa when reversing agent clinically available  Other Orders: EKG w/ Interpretation (93000)  Patient Instructions: 1)  Your physician recommends that you schedule a follow-up appointment in: 6 MONTHS WITH DR Eden Emms 2)  Your physician recommends that you continue on your current medications as directed. Please refer to the Current Medication list given to you today.   EKG Report  Procedure date:  09/30/2009  Findings:      NSR 91 LAD Abnormal ECG

## 2010-05-21 NOTE — Letter (Signed)
Summary: Appointment - Missed  [facility] [place]  [address], [place] [address]   Phone: [phone]  Fax: [phone]     April 23, 2010 MRN: [id number]   Luis Haynes [address] [place], [place]  [address]   Dear [name],  Our records indicate you missed your appointment on      04/23/10 COUMADIN CLINIC            It is very important that we reach you to reschedule this appointment. We look forward to participating in your health care needs. Please contact us at the number listed above at your earliest convenience to reschedule this appointment.     Sincerely,    Glass blower/designer

## 2010-05-21 NOTE — Medication Information (Addendum)
Summary: ccr  Anticoagulant Therapy  Managed by: Vashti Hey, RN PCP: Franchot Heidelberg, MD Supervising MD: Diona Browner MD, Remi Deter Indication 1: Mitral Valve Replacement (ICD-V43.3) Indication 2: Atrial Fibrillation (ICD-427.31) Lab Used: [facility]  Site: [place] INR POC 3.5  Dietary changes: no    Health status changes: no    Bleeding/hemorrhagic complications: no    Recent/future hospitalizations: no    Any changes in medication regimen? no    Recent/future dental: no  Any missed doses?: no       Is patient compliant with meds? yes       Allergies: 1)  ! Sulfa  Anticoagulation Management History:      The patient is taking warfarin and comes in today for a routine follow up visit.  Negative risk factors for bleeding include an age less than 62 years old and no history of CVA/TIA.  The bleeding index is 'low risk'.  Positive CHADS2 values include History of HTN.  Negative CHADS2 values include Age > 41 years old, History of Diabetes, and Prior Stroke/CVA/TIA.  The start date was 04/19/1998.  Anticoagulation responsible provider: Diona Browner MD, Remi Deter.  INR POC: 3.5.  Exp: 11/2010.    Anticoagulation Management Assessment/Plan:      The patient's current anticoagulation dose is Coumadin 5 mg tabs: As directed.  The target INR is 2.5 - 3.5.  The next INR is due 05/27/2010.  Anticoagulation instructions were given to patient.  Results were reviewed/authorized by Vashti Hey, RN.  He was notified by Vashti Hey RN.         Prior Anticoagulation Instructions: INR 3.4 Continue coumadin 5mg  once daily except 7.5mg  on Mondays, Wednesdays and Fridays  Current Anticoagulation Instructions: INR 3.5 Continue coumadin 5mg  once daily except 7.5mg  on Mondays, Wednesday and Fridays

## 2010-05-27 ENCOUNTER — Encounter: Payer: Self-pay | Admitting: Cardiology

## 2010-05-27 ENCOUNTER — Encounter (INDEPENDENT_AMBULATORY_CARE_PROVIDER_SITE_OTHER): Payer: BC Managed Care – PPO

## 2010-05-27 DIAGNOSIS — Z7901 Long term (current) use of anticoagulants: Secondary | ICD-10-CM

## 2010-05-27 DIAGNOSIS — I4891 Unspecified atrial fibrillation: Secondary | ICD-10-CM

## 2010-05-27 LAB — CONVERTED CEMR LAB: POC INR: 2.9

## 2010-06-04 NOTE — Medication Information (Signed)
Summary: ccr-lr [place]  Lab Visit  Orders Today:  Anticoagulant Therapy  Managed by: Vashti Hey, RN PCP: Franchot Heidelberg, MD Supervising MD: Dietrich Pates MD, Molly Maduro Indication 1: Mitral Valve Replacement (ICD-V43.3) Indication 2: Atrial Fibrillation (ICD-427.31) Lab Used: [facility] [place] Site: [place] INR POC 2.9  Dietary changes: no    Health status changes: no    Bleeding/hemorrhagic complications: no    Recent/future hospitalizations: no    Any changes in medication regimen? no    Recent/future dental: no  Any missed doses?: no       Is patient compliant with meds? yes         Anticoagulation Management History:      The patient is taking warfarin and comes in today for a routine follow up visit.  Negative risk factors for bleeding include an age less than 66 years old and no history of CVA/TIA.  The bleeding index is 'low risk'.  Positive CHADS2 values include History of HTN.  Negative CHADS2 values include Age > 13 years old, History of Diabetes, and Prior Stroke/CVA/TIA.  The start date was 04/19/1998.  Anticoagulation responsible provider: Dietrich Pates MD, Molly Maduro.  INR POC: 2.9.  Cuvette Lot#: [id number].  Exp: 11/2010.    Anticoagulation Management Assessment/Plan:      The patient's current anticoagulation dose is Coumadin 5 mg tabs: As directed.  The target INR is 2.5 - 3.5.  The next INR is due 06/24/2010.  Anticoagulation instructions were given to patient.  Results were reviewed/authorized by Vashti Hey, RN.  He was notified by Vashti Hey RN.         Prior Anticoagulation Instructions: INR 3.5 Continue coumadin 5mg  once daily except 7.5mg  on Mondays, Wednesday and Fridays  Current Anticoagulation Instructions: INR 2.9 Continue coumadin 5mg  once daily except 7.5mg  on Mondays, Wednesdays and Fridays

## 2010-06-11 ENCOUNTER — Encounter: Payer: Self-pay | Admitting: Cardiovascular Disease

## 2010-06-11 ENCOUNTER — Ambulatory Visit (INDEPENDENT_AMBULATORY_CARE_PROVIDER_SITE_OTHER): Payer: BC Managed Care – PPO | Admitting: Cardiovascular Disease

## 2010-06-11 DIAGNOSIS — I359 Nonrheumatic aortic valve disorder, unspecified: Secondary | ICD-10-CM

## 2010-06-11 DIAGNOSIS — Z7901 Long term (current) use of anticoagulants: Secondary | ICD-10-CM

## 2010-06-16 NOTE — Assessment & Plan Note (Signed)
Summary: F6M PER PT CALL-MB/JT unable to confirm appt lmom=mj  Medications Added WARFARIN SODIUM 5 MG TABS (WARFARIN SODIUM) as directed      Allergies Added:   Primary Provider:  Franchot Heidelberg, MD   History of Present Illness: Luis Haynes is seen today in followup for his hypertension and aortic valve replacement.  His last echocardiogram in 2008 showed no significant LV cavity dilatation his EF was normal there was no periprostatic leaking he had a mean gradient of only 4 mm mercury across the valve.  He is due to have his Coumadin checked today.  He has been therapeutic in general.  There's been no bleeding diathesis.  He's been following his SBE prophylaxis.  He's been compliant with his blood pressure medication.   Otherwise he's not had any significant chest pain PND or orthopnea.  Has been no TIA or CVA.  Has been no signs of heart failure  Has ? venous insuficiency with pain in LE;s that has been markedly improved with support hose  INR;s have been Rx and following in coumadin clinic regularlry  Gets good primary care F/U with [facility] in [place]  Current Problems (verified): 1)  Aortic Valve Replacement, Hx of  (ICD-V43.3) 2)  Premature Ventricular Contractions  (ICD-427.69) 3)  Hypertension  (ICD-401.9) 4)  Hyperlipidemia  (ICD-272.4) 5)  Well Adult  (ICD-V70.0) 6)  Anticoagulation Therapy  (ICD-V58.61) 7)  Mitral Valve Prolapse  (ICD-424.0) 8)  Allergic Rhinitis  (ICD-477.9)  Current Medications (verified): 1)  Warfarin Sodium 5 Mg Tabs (Warfarin Sodium) .... As Directed 2)  Vytorin 10-20 Mg Tabs (Ezetimibe-Simvastatin) .... Once Daily 3)  Aspirin 81 Mg Tbec (Aspirin) .... Once Daily 4)  Lisinopril 40 Mg  Tabs (Lisinopril) .... One Daily 5)  Fish Oil   Oil (Fish Oil) .... 1000mg  Two Times A Day  Allergies (verified): 1)  ! Sulfa  Past History:  Past Medical History: Last updated: 08/07/2008 Current Problems:  PREMATURE VENTRICULAR CONTRACTIONS  (ICD-427.69) HYPERTENSION (ICD-401.9) HYPERLIPIDEMIA (ICD-272.4) WELL ADULT (ICD-V70.0) ANTICOAGULATION THERAPY (ICD-V58.61) MITRAL VALVE PROLAPSE (ICD-424.0) ALLERGIC RHINITIS (ICD-477.9)  mitral valve prolapse   Past Surgical History: Last updated: 08/07/2008 Appendectomy mitral valve replacement and be with the St. Jude valve in 2001.  orthopedic Procedure  Left shoulder surgery in 1979.  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   Vital Signs:  Patient profile:   60 year old male Height:      73 inches Weight:      202 pounds BMI:     26.75 Pulse rate:   80 / minute Resp:     14 per minute BP sitting:   130 / 80  (left arm)  Vitals Entered By: Kem Parkinson (June 11, 2010 4:01 PM)  Physical Exam  General:  Affect appropriate Healthy:  appears stated age HEENT: normal Neck supple with no adenopathy JVP normal no bruits no thyromegaly Lungs clear with no wheezing and good diaphragmatic motion Heart:  S1/S2 click  sytoloci  murmur,rub, gallop or click PMI normal Abdomen: benighn, BS positve, no tenderness, no AAA no bruit.  No HSM or HJR Distal  pulses intact with no bruits No edema Neuro non-focal Skin warm and dry    Impression & Recommendations:  Problem # 1:  AORTIC VALVE REPLACEMENT, HX OF (ICD-V43.3) Normal exam  Sees dentist twice a year with SBE  No need for echo.  Compliant with Rx coumadin  Problem # 2:  HYPERTENSION (ICD-401.9) Well controlled His updated medication list for  this problem includes:    Aspirin 81 Mg Tbec (Aspirin) ..... Once daily    Lisinopril 40 Mg Tabs (Lisinopril) ..... One daily  Problem # 3:  HYPERLIPIDEMIA (ICD-272.4) At goal continue statin His updated medication list for this problem includes:    Vytorin 10-20 Mg Tabs (Ezetimibe-simvastatin) ..... Once daily  CHOL: 140 (05/16/2008)   LDL: 64 (05/16/2008)   HDL: 49 (05/16/2008)   TG: 137 (05/16/2008) CHOL (goal): 200 (09/05/2006)   LDL (goal): 130 (09/05/2006)   HDL (goal): 40 (09/05/2006)   TG (goal): 150 (09/05/2006) Prescriptions: WARFARIN SODIUM 5 MG TABS (WARFARIN SODIUM) as directed  #60 x 12   Entered by:   Kem Parkinson   Authorized by:   Colon Branch, MD, [facility]   Signed by:   Kem Parkinson on 06/11/2010   Method used:   Electronically to        [facility] (retail)       [address]       [place], [place]  [address]       Ph: [phone]       Fax: [phone]   RxID:   [id number]

## 2010-06-24 ENCOUNTER — Encounter: Payer: Self-pay | Admitting: Cardiovascular Disease

## 2010-06-24 ENCOUNTER — Encounter (INDEPENDENT_AMBULATORY_CARE_PROVIDER_SITE_OTHER): Payer: BC Managed Care – PPO

## 2010-06-24 DIAGNOSIS — I4891 Unspecified atrial fibrillation: Secondary | ICD-10-CM

## 2010-06-24 DIAGNOSIS — Z7901 Long term (current) use of anticoagulants: Secondary | ICD-10-CM

## 2010-06-24 LAB — CONVERTED CEMR LAB: POC INR: 3.5

## 2010-06-30 NOTE — Medication Information (Signed)
Summary: ccr-lr  Anticoagulant Therapy  Managed by: Vashti Hey, RN PCP: Franchot Heidelberg, MD Supervising MD: Eden Emms MD, Theron Arista Indication 1: Mitral Valve Replacement (ICD-V43.3) Indication 2: Atrial Fibrillation (ICD-427.31) Lab Used: [facility] [place] Site: [place] INR POC 3.5  Dietary changes: no    Health status changes: no    Bleeding/hemorrhagic complications: no    Recent/future hospitalizations: no    Any changes in medication regimen? no    Recent/future dental: no  Any missed doses?: no       Is patient compliant with meds? yes       Allergies: 1)  ! Sulfa  Anticoagulation Management History:      The patient is taking warfarin and comes in today for a routine follow up visit.  Negative risk factors for bleeding include an age less than 28 years old and no history of CVA/TIA.  The bleeding index is 'low risk'.  Positive CHADS2 values include History of HTN.  Negative CHADS2 values include Age > 2 years old, History of Diabetes, and Prior Stroke/CVA/TIA.  The start date was 04/19/1998.  Anticoagulation responsible provider: Eden Emms MD, Theron Arista.  INR POC: 3.5.  Cuvette Lot#: [id number].  Exp: 11/2010.    Anticoagulation Management Assessment/Plan:      The patient's current anticoagulation dose is Warfarin sodium 5 mg tabs: as directed.  The target INR is 2.5 - 3.5.  The next INR is due 07/22/2010.  Anticoagulation instructions were given to patient.  Results were reviewed/authorized by Vashti Hey, RN.  He was notified by Vashti Hey RN.         Prior Anticoagulation Instructions: INR 2.9 Continue coumadin 5mg  once daily except 7.5mg  on Mondays, Wednesdays and Fridays  Current Anticoagulation Instructions: INR 3.5 Continue coumadin 5mg  once daily except 7.5mg  on Mondays, Wednesdays and Fridays

## 2010-07-17 ENCOUNTER — Encounter: Payer: Self-pay | Admitting: Cardiovascular Disease

## 2010-07-17 DIAGNOSIS — Z7901 Long term (current) use of anticoagulants: Secondary | ICD-10-CM | POA: Insufficient documentation

## 2010-07-17 DIAGNOSIS — Z952 Presence of prosthetic heart valve: Secondary | ICD-10-CM

## 2010-07-17 DIAGNOSIS — I059 Rheumatic mitral valve disease, unspecified: Secondary | ICD-10-CM

## 2010-07-22 ENCOUNTER — Ambulatory Visit (INDEPENDENT_AMBULATORY_CARE_PROVIDER_SITE_OTHER): Payer: BC Managed Care – PPO | Admitting: *Deleted

## 2010-07-22 DIAGNOSIS — Z7901 Long term (current) use of anticoagulants: Secondary | ICD-10-CM

## 2010-07-22 DIAGNOSIS — I059 Rheumatic mitral valve disease, unspecified: Secondary | ICD-10-CM

## 2010-07-22 DIAGNOSIS — Z952 Presence of prosthetic heart valve: Secondary | ICD-10-CM

## 2010-08-19 ENCOUNTER — Encounter: Payer: BC Managed Care – PPO | Admitting: *Deleted

## 2010-09-01 NOTE — Assessment & Plan Note (Signed)
[facility]                            CARDIOLOGY OFFICE NOTE   Luis Haynes, Luis Haynes                    MRN:          [id number]  DATE:11/17/2006                            DOB:          01-Jun-1950    Luis Haynes is seen today in followup.  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  He has a mechanical prosthetic aortic valve.  He  has been doing well.  He also has hypertension, hyperlipidemia.   The patient has been doing well.  He seems to be working a lot.  He has  just started to take more time for himself, including doing some  golfing.  His wife is a news anchor at [employer].  They have no  children, so they tend to do most of their activities with each other.   In talking to the patient, his Coumadin levels have been okay.  There  has been an occasional level above 4, but in general, there have been no  significant problems with his Coumadin and he has not had any  significant TIA, CVAs or bleeding diathesis.  There has been no blood in  his stool.   His blood pressure has been under good control.  He does take it  occasionally at home.  He tries to adhere to a low-salt diet.  I do not  have recent lipids on the patient, but he has been taking his Vytorin.   There is no previously documented coronary disease, and he is taking a  baby aspirin a day.  I did note that he had a PVC on his EKG today.  In  talking to him, he has not noted any palpitations, syncope, or rapid  heart rhythms.   His review of systems, otherwise, negative.   His medications include:  1. Coumadin as directed.  2. Lisinopril 10 a day.  3. Vytorin 10/20.  4. An aspirin a day.   PHYSICAL EXAMINATION:  Remarkable for a healthy-appearing, middle-aged  white male in no distress.  Affect is appropriate.  Weight is 198.  Blood pressure is 130/80, pulse is 83 with an occasional  PVC.  He is afebrile.  Respiratory rate is 14.  HEENT:  Normal.  Carotids normal  without bruit.  There is no  lymphadenopathy, no thyromegaly, no JVP elevation.  LUNGS:  Clear with good diaphragmatic motion.  No wheezing.  There is an S1 with an S2 click.  There is no diastolic murmur of aortic  insufficiency.  PMI is normal.  ABDOMEN:  Benign.  Bowel sounds positive, no tenderness, no  hepatosplenomegaly, no hepatojugular reflux, no triple A, no tenderness,  no bruits.  Distal pulses are intact with no edema.  Femorals are +3, PTs are +3.  There is no lymphadenopathy.  NEURO:  Nonfocal.  There is no muscular weakness.   His EKG shows sinus rhythm with a left axis deviation, occasional PVC.  There are no acute changes.   IMPRESSION:  1. Aortic valve replacement, functionally sounds well, however, will      do a 2D echocardiogram this year.  He has not had  1 in over 2      years.  He will continue his Coumadin anticoagulation.  2. Hypertension, currently well controlled, continue low-salt diet and      Lisinopril.  3. Hyperlipidemia.  Follow primary care MD.  Continue Vytorin 10/20.      I told him that he needed to make sure that he had followup LFTs.      In October of 2007 his LDL was 72 and his LFTs were normal, but      these are the last labs I have.  4. Premature ventricular contractions on EKG, currently asymptomatic.      History of normal left ventricular function so long as his echo      does not show any change in his left ventricular function, we will      continue to treat his hypertension and not institute beta blockers.      I think this is a benign finding.  5. I will see the patient back in a year so long as his echo is fine.     Noralyn Pick. Eden Emms, MD, [facility]  Electronically Signed    PCN/MedQ  DD: 11/17/2006  DT: 11/17/2006  Job #: [id number]

## 2010-09-01 NOTE — Assessment & Plan Note (Signed)
[facility]                            CARDIOLOGY OFFICE NOTE   [name], [name]                    MRN:          [id number]  DATE:02/08/2008                            DOB:          1950/07/06    Luis Haynes returns today for followup.  He is an Production designer, theatre/television/film at [employer].  He  is doing well.  He had a mitral valve replacement and be with the St.  Jude valve in 2001.  He had his Coumadin levels checked in [place].  Dr. Erby Pian is his medical doctor who is very fond of.  He denies any  significant problems, had been no TIAs.  His INRs had been therapeutic.  He is having significant palpitations, despite having a mitral valve in  place for this length of time.  He has maintained in sinus rhythm.   His coronary risk factors are well modified in regards to his  hypertension, hypercholesterolemia.  His triglycerides were little high,  which were dye related.   His review of systems otherwise negative.  He did have his teeth  cleaning and a tooth pulled recently.  He takes amoxicillin prior to any  dental procedures.   ALLERGIES:  He is allergic to SULFA.   MEDICATIONS:  1. He is on Coumadin as directed.  2. Lisinopril 40 a day.  3. Baby aspirin a day.  4. Vytorin 10/20.   PHYSICAL EXAMINATION:  GENERAL:  Remarkable for healthy-appearing middle-  aged male in no distress.  VITAL SIGNS:  Blood pressure is 115/80, pulse 81 and regular,  respiratory rate 14, afebrile, weight 201.  HEENT:  Unremarkable.  Carotids are normal without bruit.  No  lymphadenopathy, thyromegaly, JVP elevation.  CARDIAC:  There is an S1 click with an S2.  There is no diastolic  rumble.  PMI normal.  ABDOMEN:  Benign.  Bowel sounds positive.  No AAA, no tenderness, no  bruit, no hepatosplenomegaly, hepatojugular reflux, or tenderness.  EXTREMITIES:  Distal pulses intact.  No edema.  NEURO:  Nonfocal.  SKIN:  Warm and dry.  No muscle weakness.   EKG shows sinus rhythm with  incomplete right bundle branch block and  slight left axis deviation.   IMPRESSION:  1. Mitral valve replacement, valve sounds normal.  He did have an      echocardiogram done in August 2008, without significant prosthetic      problems in the normal EF.  No need for followup echo at this time.  2. Anticoagulation.  I continue to follow up in the [facility].      Currently, no issues regarding his anticoagulation.  3. Hypertension currently well controlled.  Continue higher dose      lisinopril, low-salt diet.  4. Hyperlipidemia.  Continue Vytorin.  Tried to adjust his diet with      decreased fat intake.  Followup primary care needs Dr. Erby Pian.     Noralyn Pick. Eden Emms, MD, [facility]  Electronically Signed    PCN/MedQ  DD: 02/08/2008  DT: 02/09/2008  Job #: [id number]

## 2010-09-04 NOTE — Assessment & Plan Note (Signed)
[facility]                              CARDIOLOGY OFFICE NOTE   Luis Haynes, Luis Haynes                    MRN:          [id number]  DATE:11/15/2005                            DOB:          1950/10/29    REASON FOR VISIT:  Luis Haynes is a delightful 60 year old patient who is  here to be established.  He has been seen by Dr. Malen Gauze in [place] over  the last few years.  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   He has a history of mitral valve prolapse with a flail leaflet.  He had a  St. Jude mitral valve prosthesis done in 2000.  He has been on Coumadin  since.  There has been on atrial fibrillation.  There has been on evidence  of coronary disease.  His Coumadin levels in general have been good.  Apparently, he was recommended to bridge with Lovenox when having a  colonoscopy, and this has been about the only time his INRs have not been  regulated.  He has an appointment to see our Coumadin Clinic tomorrow, and I  will try to let him see them today while he is here.   He is not established with a primary care physician yet.  I suggested Dr.  Carylon Perches in [place] if this is where he is going to be living.   PAST MEDICAL HISTORY:  1.  Mitral valve surgery.  2.  Left shoulder surgery in 1979.  3.  Appendectomy in 2000.   SOCIAL HISTORY:  He does not smoke.  He is active.  He walks on a regular  basis.  He was remarried two years ago.  He has a cat and a dog but no  children.  His family lives in the area.   PROPHYLAXIS:  He has been using SP prophylaxis with amoxicillin.   ALLERGIES:  HE HAS AN ALLERGY TO SULFA DRUGS.  HE DOES NOT HAVE A SHELLFISH  ALLERGY.   MEDICATIONS:  1.  Coumadin 5 to 7.5 mg.  2.  Lisinopril 10 mg daily.  3.  Vytorin 10/20.  4.  Aspirin 81 mg.   PHYSICAL EXAMINATION:  GENERAL:  He looks well.  VITAL SIGNS:  Blood pressure is 120/70, pulse 88 and regular.  LUNGS:   Clear.  NECK:  Carotids are normal.  HEART:  There is an S1 click with an S2.  There is no MR.  ABDOMEN:  Benign.  EXTREMITIES:  Lower extremities pulses intact.  No edema.   LABORATORY DATA:  EKG shows sinus rhythm with incomplete right bundle branch  block.   IMPRESSION:  Stable 11-year-old St. Jude Curator aortic valve which sounds  normal.  He can have a followup echocardiogram in a year.  He will be  established with our Coumadin Clinic.  He will continue his current blood  pressure pills.  Overall, I think his heart is stable.  I will see him in a  year.  Luis Haynes. Eden Emms, MD, [facility]    PCN/MedQ  DD:  11/15/2005  DT:  11/15/2005  Job #:  [id number]   cc:   Bethann Berkshire, M.D.  Kingsley Callander. Ouida Sills, MD

## 2010-10-08 ENCOUNTER — Ambulatory Visit (INDEPENDENT_AMBULATORY_CARE_PROVIDER_SITE_OTHER): Payer: BC Managed Care – PPO | Admitting: *Deleted

## 2010-10-08 DIAGNOSIS — I059 Rheumatic mitral valve disease, unspecified: Secondary | ICD-10-CM

## 2010-10-08 DIAGNOSIS — Z7901 Long term (current) use of anticoagulants: Secondary | ICD-10-CM

## 2010-10-08 DIAGNOSIS — Z952 Presence of prosthetic heart valve: Secondary | ICD-10-CM

## 2010-11-05 ENCOUNTER — Ambulatory Visit (INDEPENDENT_AMBULATORY_CARE_PROVIDER_SITE_OTHER): Payer: BC Managed Care – PPO | Admitting: *Deleted

## 2010-11-05 DIAGNOSIS — Z952 Presence of prosthetic heart valve: Secondary | ICD-10-CM

## 2010-11-05 DIAGNOSIS — I059 Rheumatic mitral valve disease, unspecified: Secondary | ICD-10-CM

## 2010-11-05 DIAGNOSIS — Z7901 Long term (current) use of anticoagulants: Secondary | ICD-10-CM

## 2010-11-05 LAB — POCT INR: INR: 3.1

## 2010-12-03 ENCOUNTER — Encounter: Payer: BC Managed Care – PPO | Admitting: *Deleted

## 2010-12-16 ENCOUNTER — Ambulatory Visit (INDEPENDENT_AMBULATORY_CARE_PROVIDER_SITE_OTHER): Payer: BC Managed Care – PPO | Admitting: *Deleted

## 2010-12-16 DIAGNOSIS — Z952 Presence of prosthetic heart valve: Secondary | ICD-10-CM

## 2010-12-16 DIAGNOSIS — I059 Rheumatic mitral valve disease, unspecified: Secondary | ICD-10-CM

## 2010-12-16 DIAGNOSIS — Z954 Presence of other heart-valve replacement: Secondary | ICD-10-CM

## 2010-12-16 DIAGNOSIS — Z7901 Long term (current) use of anticoagulants: Secondary | ICD-10-CM

## 2011-01-14 ENCOUNTER — Encounter: Payer: BC Managed Care – PPO | Admitting: *Deleted

## 2011-01-15 ENCOUNTER — Encounter: Payer: Self-pay | Admitting: *Deleted

## 2011-02-25 ENCOUNTER — Ambulatory Visit (INDEPENDENT_AMBULATORY_CARE_PROVIDER_SITE_OTHER): Payer: BC Managed Care – PPO | Admitting: *Deleted

## 2011-02-25 DIAGNOSIS — Z7901 Long term (current) use of anticoagulants: Secondary | ICD-10-CM

## 2011-02-25 DIAGNOSIS — I059 Rheumatic mitral valve disease, unspecified: Secondary | ICD-10-CM

## 2011-02-25 DIAGNOSIS — Z952 Presence of prosthetic heart valve: Secondary | ICD-10-CM

## 2011-04-08 ENCOUNTER — Ambulatory Visit (INDEPENDENT_AMBULATORY_CARE_PROVIDER_SITE_OTHER): Payer: BC Managed Care – PPO | Admitting: *Deleted

## 2011-04-08 DIAGNOSIS — Z952 Presence of prosthetic heart valve: Secondary | ICD-10-CM

## 2011-04-08 DIAGNOSIS — Z7901 Long term (current) use of anticoagulants: Secondary | ICD-10-CM

## 2011-04-08 DIAGNOSIS — I059 Rheumatic mitral valve disease, unspecified: Secondary | ICD-10-CM

## 2011-04-08 LAB — POCT INR: INR: 3.3

## 2011-05-20 ENCOUNTER — Encounter: Payer: BC Managed Care – PPO | Admitting: *Deleted

## 2011-06-17 ENCOUNTER — Ambulatory Visit (INDEPENDENT_AMBULATORY_CARE_PROVIDER_SITE_OTHER): Payer: BC Managed Care – PPO | Admitting: *Deleted

## 2011-06-17 DIAGNOSIS — Z7901 Long term (current) use of anticoagulants: Secondary | ICD-10-CM

## 2011-06-17 DIAGNOSIS — I059 Rheumatic mitral valve disease, unspecified: Secondary | ICD-10-CM

## 2011-06-17 DIAGNOSIS — Z952 Presence of prosthetic heart valve: Secondary | ICD-10-CM

## 2011-06-23 ENCOUNTER — Other Ambulatory Visit: Payer: Self-pay | Admitting: Cardiovascular Disease

## 2011-07-15 ENCOUNTER — Encounter: Payer: BC Managed Care – PPO | Admitting: *Deleted

## 2011-08-09 ENCOUNTER — Telehealth: Payer: Self-pay | Admitting: Cardiovascular Disease

## 2011-08-09 NOTE — Telephone Encounter (Signed)
New Problem:     I called the patient and was unable to reach them. I left a message on their voicemail with my name, the reason I called, the name of his physician, and a number to call back to schedule his appointment. 

## 2011-08-18 ENCOUNTER — Ambulatory Visit (INDEPENDENT_AMBULATORY_CARE_PROVIDER_SITE_OTHER): Payer: BC Managed Care – PPO | Admitting: *Deleted

## 2011-08-18 DIAGNOSIS — I059 Rheumatic mitral valve disease, unspecified: Secondary | ICD-10-CM

## 2011-08-18 DIAGNOSIS — Z952 Presence of prosthetic heart valve: Secondary | ICD-10-CM

## 2011-08-18 DIAGNOSIS — Z7901 Long term (current) use of anticoagulants: Secondary | ICD-10-CM

## 2011-09-08 ENCOUNTER — Ambulatory Visit (INDEPENDENT_AMBULATORY_CARE_PROVIDER_SITE_OTHER): Payer: BC Managed Care – PPO | Admitting: *Deleted

## 2011-09-08 DIAGNOSIS — I059 Rheumatic mitral valve disease, unspecified: Secondary | ICD-10-CM

## 2011-09-08 DIAGNOSIS — Z7901 Long term (current) use of anticoagulants: Secondary | ICD-10-CM

## 2011-09-08 DIAGNOSIS — Z952 Presence of prosthetic heart valve: Secondary | ICD-10-CM

## 2011-09-08 LAB — POCT INR: INR: 3

## 2011-10-20 ENCOUNTER — Ambulatory Visit (INDEPENDENT_AMBULATORY_CARE_PROVIDER_SITE_OTHER): Payer: BC Managed Care – PPO | Admitting: *Deleted

## 2011-10-20 DIAGNOSIS — Z952 Presence of prosthetic heart valve: Secondary | ICD-10-CM

## 2011-10-20 DIAGNOSIS — Z7901 Long term (current) use of anticoagulants: Secondary | ICD-10-CM

## 2011-10-20 DIAGNOSIS — I059 Rheumatic mitral valve disease, unspecified: Secondary | ICD-10-CM

## 2011-10-20 DIAGNOSIS — Z954 Presence of other heart-valve replacement: Secondary | ICD-10-CM

## 2011-10-20 LAB — POCT INR: INR: 3.8

## 2011-12-01 ENCOUNTER — Ambulatory Visit (INDEPENDENT_AMBULATORY_CARE_PROVIDER_SITE_OTHER): Payer: BC Managed Care – PPO | Admitting: *Deleted

## 2011-12-01 DIAGNOSIS — Z952 Presence of prosthetic heart valve: Secondary | ICD-10-CM

## 2011-12-01 DIAGNOSIS — I059 Rheumatic mitral valve disease, unspecified: Secondary | ICD-10-CM

## 2011-12-01 DIAGNOSIS — Z7901 Long term (current) use of anticoagulants: Secondary | ICD-10-CM

## 2011-12-01 DIAGNOSIS — Z954 Presence of other heart-valve replacement: Secondary | ICD-10-CM

## 2011-12-01 LAB — POCT INR: INR: 4.9

## 2011-12-22 ENCOUNTER — Ambulatory Visit (INDEPENDENT_AMBULATORY_CARE_PROVIDER_SITE_OTHER): Payer: BC Managed Care – PPO | Admitting: *Deleted

## 2011-12-22 DIAGNOSIS — I059 Rheumatic mitral valve disease, unspecified: Secondary | ICD-10-CM

## 2011-12-22 DIAGNOSIS — Z7901 Long term (current) use of anticoagulants: Secondary | ICD-10-CM

## 2011-12-22 DIAGNOSIS — Z954 Presence of other heart-valve replacement: Secondary | ICD-10-CM

## 2011-12-22 DIAGNOSIS — Z952 Presence of prosthetic heart valve: Secondary | ICD-10-CM

## 2012-01-17 ENCOUNTER — Ambulatory Visit (INDEPENDENT_AMBULATORY_CARE_PROVIDER_SITE_OTHER): Payer: BC Managed Care – PPO | Admitting: *Deleted

## 2012-01-17 DIAGNOSIS — Z952 Presence of prosthetic heart valve: Secondary | ICD-10-CM

## 2012-01-17 DIAGNOSIS — I059 Rheumatic mitral valve disease, unspecified: Secondary | ICD-10-CM

## 2012-01-17 DIAGNOSIS — Z954 Presence of other heart-valve replacement: Secondary | ICD-10-CM

## 2012-01-17 DIAGNOSIS — Z7901 Long term (current) use of anticoagulants: Secondary | ICD-10-CM

## 2012-01-17 LAB — POCT INR: INR: 4.1

## 2012-02-10 ENCOUNTER — Ambulatory Visit (INDEPENDENT_AMBULATORY_CARE_PROVIDER_SITE_OTHER): Payer: BC Managed Care – PPO | Admitting: *Deleted

## 2012-02-10 DIAGNOSIS — Z7901 Long term (current) use of anticoagulants: Secondary | ICD-10-CM

## 2012-02-10 DIAGNOSIS — I059 Rheumatic mitral valve disease, unspecified: Secondary | ICD-10-CM

## 2012-02-10 DIAGNOSIS — Z952 Presence of prosthetic heart valve: Secondary | ICD-10-CM

## 2012-02-10 DIAGNOSIS — Z954 Presence of other heart-valve replacement: Secondary | ICD-10-CM

## 2012-02-18 ENCOUNTER — Emergency Department (HOSPITAL_COMMUNITY)
Admission: EM | Admit: 2012-02-18 | Discharge: 2012-02-18 | Disposition: A | Discharge status: Home / Self Care | Payer: BC Managed Care – PPO | Attending: Emergency Medicine | Admitting: Emergency Medicine

## 2012-02-18 ENCOUNTER — Emergency Department (HOSPITAL_COMMUNITY): Payer: BC Managed Care – PPO

## 2012-02-18 ENCOUNTER — Encounter (HOSPITAL_COMMUNITY): Payer: Self-pay

## 2012-02-18 DIAGNOSIS — Z79899 Other long term (current) drug therapy: Secondary | ICD-10-CM | POA: Insufficient documentation

## 2012-02-18 DIAGNOSIS — I69998 Other sequelae following unspecified cerebrovascular disease: Secondary | ICD-10-CM | POA: Insufficient documentation

## 2012-02-18 DIAGNOSIS — R110 Nausea: Secondary | ICD-10-CM | POA: Insufficient documentation

## 2012-02-18 DIAGNOSIS — Z881 Allergy status to other antibiotic agents status: Secondary | ICD-10-CM | POA: Insufficient documentation

## 2012-02-18 DIAGNOSIS — R42 Dizziness and giddiness: Secondary | ICD-10-CM

## 2012-02-18 DIAGNOSIS — I10 Essential (primary) hypertension: Secondary | ICD-10-CM | POA: Insufficient documentation

## 2012-02-18 DIAGNOSIS — Z9889 Other specified postprocedural states: Secondary | ICD-10-CM | POA: Insufficient documentation

## 2012-02-18 DIAGNOSIS — Z882 Allergy status to sulfonamides status: Secondary | ICD-10-CM | POA: Insufficient documentation

## 2012-02-18 HISTORY — DX: Essential (primary) hypertension: I10

## 2012-02-18 HISTORY — DX: Presence of prosthetic heart valve: Z95.2

## 2012-02-18 LAB — CBC WITH DIFFERENTIAL/PLATELET
Basophils Absolute: 0 10*3/uL (ref 0.0–0.1)
Eosinophils Relative: 2 % (ref 0–5)
HCT: 44.5 % (ref 39.0–52.0)
Lymphocytes Relative: 24 % (ref 12–46)
Lymphs Abs: 2.1 10*3/uL (ref 0.7–4.0)
MCV: 87.4 fL (ref 78.0–100.0)
Monocytes Absolute: 0.5 10*3/uL (ref 0.1–1.0)
Neutro Abs: 6 10*3/uL (ref 1.7–7.7)
Platelets: 249 10*3/uL (ref 150–400)
RBC: 5.09 MIL/uL (ref 4.22–5.81)
RDW: 13.7 % (ref 11.5–15.5)
WBC: 8.8 10*3/uL (ref 4.0–10.5)

## 2012-02-18 LAB — BASIC METABOLIC PANEL
CO2: 25 mEq/L (ref 19–32)
Chloride: 99 mEq/L (ref 96–112)
Glucose, Bld: 120 mg/dL — ABNORMAL HIGH (ref 70–99)
Sodium: 135 mEq/L (ref 135–145)

## 2012-02-18 LAB — TROPONIN I
Troponin I: <0.3 ng/mL (ref <0.30)
Troponin I: <0.3 ng/mL (ref <0.30)

## 2012-02-18 LAB — PROTIME-INR: Prothrombin Time: 25.6 seconds — ABNORMAL HIGH (ref 11.6–15.2)

## 2012-02-18 MED ORDER — MECLIZINE HCL 50 MG PO TABS: ORAL_TABLET | ORAL | Status: DC

## 2012-02-18 MED ORDER — ONDANSETRON 8 MG PO TBDP: 8.0000 mg | ORAL_TABLET | Freq: Three times a day (TID) | ORAL | Status: DC | PRN

## 2012-02-18 NOTE — ED Provider Notes (Cosign Needed)
History   This chart was scribed for [name], MD by Gerlean Ren. This patient was seen in room APA18/APA18 and the patient's care was started at 14:00.   CSN: [id number]  Arrival date & time 02/18/12  1329   First MD Initiated Contact with Patient 02/18/12 1352      Chief Complaint  Patient presents with  . Dizziness    (Consider location/radiation/quality/duration/timing/severity/associated sxs/prior treatment) The history is provided by the patient. No language interpreter was used.   Luis Haynes is a 61 y.o. male who presents to the Emergency Department complaining of a near syncopal episode involving room-spinning dizziness and loss of balance lasting 3-4 minutes 1.5 hours ago while standing at the [employer] annual scholarship luncheon. Witness reports pt was diaphoretic and had ashen skin during episode.  Pt reports nausea at the time that has since resolved.  Pt immediately sat down and was able to ambulate normally after 3-4 minutes.  Pt denies chest pain,palpitations, HA, abdominal pain, emesis, or dyspnea during the episode and currently.  Pt states he feels "pretty good and stable" currently.  No h/o similar episodes.    Pt had mitral valve replacement (St Judes)  in 2000 with no complications. Reports his INR last month was 3.0  Pt has h/o HTN.  Pt denies tobacco use and reports only occasional alcohol use.   PCP Dr Catalina Pizza Cardiologist is Dr. Eden Emms.  Past Medical History  Diagnosis Date  . H/O mitral valve replacement   . Hypertension     Past Surgical History  Procedure Date  . Shoulder surgery   . Mitral valve replacement     No family history on file.  History  Substance Use Topics  . Smoking status: Never Smoker   . Smokeless tobacco: Not on file  . Alcohol Use: Yes     occ  employed Lives at home    Review of Systems  Neurological: Positive for dizziness and syncope.  All other systems reviewed and are negative.    Allergies  Erythromycin  and Sulfonamide derivatives  Home Medications   Current Outpatient Rx  Name Route Sig Dispense Refill  . WARFARIN SODIUM 5 MG PO TABS  TAKE AS DIRECTED 60 tablet 12    BP 149/99  Pulse 81  Temp 98 F (36.7 C) (Oral)  Resp 20  Ht 6' (1.829 m)  Wt 200 lb (90.719 kg)  BMI 27.12 kg/m2  SpO2 99%  Vital signs normal    Physical Exam  Nursing note and vitals reviewed. Constitutional: He is oriented to person, place, and time. He appears well-developed and well-nourished.  Non-toxic appearance. He does not appear ill. No distress.  HENT:  Head: Normocephalic and atraumatic.  Right Ear: External ear normal.  Left Ear: External ear normal.  Nose: Nose normal. No mucosal edema or rhinorrhea.  Mouth/Throat: Oropharynx is clear and moist and mucous membranes are normal. No dental abscesses or uvula swelling.  Eyes: Conjunctivae normal and EOM are normal. Pupils are equal, round, and reactive to light.  Neck: Normal range of motion and full passive range of motion without pain. Neck supple.  Cardiovascular: Normal rate, regular rhythm and normal heart sounds.  Exam reveals no gallop and no friction rub.   No murmur heard.      Artificial "click" from mitral valve.  Pulmonary/Chest: Effort normal and breath sounds normal. No respiratory distress. He has no wheezes. He has no rhonchi. He has no rales. He exhibits no tenderness and no  crepitus.  Abdominal: Soft. Normal appearance and bowel sounds are normal. He exhibits no distension. There is no tenderness. There is no rebound and no guarding.  Musculoskeletal: Normal range of motion. He exhibits no edema and no tenderness.       Moves all extremities well.   Neurological: He is alert and oriented to person, place, and time. He has normal strength. No cranial nerve deficit.       No nystagymus  Skin: Skin is warm, dry and intact. No rash noted. No erythema. No pallor.  Psychiatric: He has a normal mood and affect. His speech is normal and  behavior is normal. His mood appears not anxious.    ED Course  Procedures (including critical care time) DIAGNOSTIC STUDIES: Oxygen Saturation is 99% on Quentin, normal by my interpretation.    COORDINATION OF CARE: 14:06- Patient informed of clinical course, understands medical decision-making process, and agrees with plan.  Ordered protime-INR, CBC, b-met, troponin, and chest XR.  Orthostatic vital signs were normal. Patient was able to ambulate and felt fine without any recurrence of symptoms. He's had no more dizziness while in the ED. If his second set of enzymes are negative he is going to be discharged.   Results for orders placed during the hospital encounter of 02/18/12  CBC WITH DIFFERENTIAL      Component Value Range   WBC 8.8  4.0 - 10.5 K/uL   RBC 5.09  4.22 - 5.81 MIL/uL   Hemoglobin 15.3  13.0 - 17.0 g/dL   HCT 40.9  81.1 - 91.4 %   MCV 87.4  78.0 - 100.0 fL   MCH 30.1  26.0 - 34.0 pg   MCHC 34.4  30.0 - 36.0 g/dL   RDW 78.2  95.6 - 21.3 %   Platelets 249  150 - 400 K/uL   Neutrophils Relative 68  43 - 77 %   Neutro Abs 6.0  1.7 - 7.7 K/uL   Lymphocytes Relative 24  12 - 46 %   Lymphs Abs 2.1  0.7 - 4.0 K/uL   Monocytes Relative 5  3 - 12 %   Monocytes Absolute 0.5  0.1 - 1.0 K/uL   Eosinophils Relative 2  0 - 5 %   Eosinophils Absolute 0.2  0.0 - 0.7 K/uL   Basophils Relative 1  0 - 1 %   Basophils Absolute 0.0  0.0 - 0.1 K/uL  BASIC METABOLIC PANEL      Component Value Range   Sodium 135  135 - 145 mEq/L   Potassium 3.8  3.5 - 5.1 mEq/L   Chloride 99  96 - 112 mEq/L   CO2 25  19 - 32 mEq/L   Glucose, Bld 120 (*) 70 - 99 mg/dL   BUN 18  6 - 23 mg/dL   Creatinine, Ser 0.86  0.50 - 1.35 mg/dL   Calcium 9.2  8.4 - 57.8 mg/dL   GFR calc non Af Amer 86 (*) >90 mL/min   GFR calc Af Amer >90  >90 mL/min  TROPONIN I      Component Value Range   Troponin I <0.30  <0.30 ng/mL  PROTIME-INR      Component Value Range   Prothrombin Time 25.6 (*) 11.6 - 15.2 seconds    INR 2.47 (*) 0.00 - 1.49  TROPONIN I      Component Value Range   Troponin I <0.30  <0.30 ng/mL   Laboratory interpretation all normal except therapeutic INR  Dg Chest Portable 1 View  02/18/2012  *RADIOLOGY REPORT*  Clinical Data: 61 year old male dizziness.  PORTABLE CHEST - 1 VIEW  Comparison: None.  Findings: Portable AP upright view 1417 hours.  Mild elevation of the right hemidiaphragm.  Overall the low normal lung volumes. Previous median sternotomy.  Cardiac size and mediastinal contours are within normal limits.  Visualized tracheal air column is within normal limits.  No pneumothorax, pulmonary edema, pleural effusion or confluent pulmonary opacity.  IMPRESSION: No acute cardiopulmonary abnormality.   Original Report Authenticated By: Erskine Speed, M.D.     Date: 02/18/2012  Rate: 77  Rhythm: normal sinus rhythm  QRS Axis: left  Intervals: normal  ST/T Wave abnormalities: normal  Conduction Disutrbances:nonspecific intraventricular conduction delay  Narrative Interpretation:   Old EKG Reviewed: unchanged from 09/30/2009    1. Vertigo     New Prescriptions   MECLIZINE (ANTIVERT) 50 MG TABLET    Take 1 or 2 po Q 6hrs for pain   ONDANSETRON (ZOFRAN ODT) 8 MG DISINTEGRATING TABLET    Take 1 tablet (8 mg total) by mouth every 8 (eight) hours as needed for nausea (or dizziness).    Plan discharge  Devoria Albe, MD, FACEP   MDM  I personally performed the services described in this documentation, which was scribed in my presence. The recorded information has been reviewed and considered.  Devoria Albe, MD, Armando Gang         [name], MD 02/18/12 [phone]

## 2012-02-18 NOTE — ED Notes (Signed)
Pt ambulated without difficulty, denies any dizziness, cp or SOB.

## 2012-02-18 NOTE — ED Notes (Signed)
Patient alert and oriented. Denies pain or dizziness at this time. Family at bedside.

## 2012-02-18 NOTE — ED Notes (Signed)
Pt requesting something to eat and drink.  NOtified Dr. Lynelle Doctor and was told she would review pt's tests and will notify staff when pt can eat

## 2012-02-18 NOTE — ED Notes (Signed)
Pt has had frequent PVC's on monitor, Dr. Lynelle Doctor aware, strips shown to EDP.

## 2012-02-18 NOTE — ED Notes (Signed)
Dr. Margo Aye assessed pt.

## 2012-02-18 NOTE — ED Notes (Signed)
Patient ambulated to bathroom with no assistance. Gait steady. Denies dizziness. No obvious distress noted.

## 2012-02-18 NOTE — ED Notes (Signed)
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  Says he knew he had to sit down or he might pass out.  Pt then became very clammy.  Pt says episode lasted approx 5-6 min.  Denies any pain.

## 2012-03-01 ENCOUNTER — Encounter: Payer: Self-pay | Admitting: *Deleted

## 2012-03-02 ENCOUNTER — Encounter: Payer: Self-pay | Admitting: Cardiovascular Disease

## 2012-03-02 ENCOUNTER — Ambulatory Visit (INDEPENDENT_AMBULATORY_CARE_PROVIDER_SITE_OTHER): Payer: BC Managed Care – PPO | Admitting: Cardiovascular Disease

## 2012-03-02 VITALS — BP 142/94 | HR 69 | Ht 73.0 in | Wt 201.4 lb

## 2012-03-02 DIAGNOSIS — R42 Dizziness and giddiness: Secondary | ICD-10-CM

## 2012-03-02 DIAGNOSIS — I10 Essential (primary) hypertension: Secondary | ICD-10-CM

## 2012-03-02 DIAGNOSIS — Z7901 Long term (current) use of anticoagulants: Secondary | ICD-10-CM

## 2012-03-02 DIAGNOSIS — Z954 Presence of other heart-valve replacement: Secondary | ICD-10-CM

## 2012-03-02 NOTE — Patient Instructions (Addendum)

## 2012-03-02 NOTE — Progress Notes (Signed)
Patient ID: Luis Haynes, male   DOB: December 12, 1950, 61 y.o.   MRN: [id number] 61 yo with AVR  2 weeks ago had acute onset of dizzynes and ? Vertigo at luncheion Reviewed ER notes. No arrhythmia, INR RX ECG ok CT not done.  Since then has seen Dr Reece Leader blocker added for BP.  No recurrence.  Room was spinning with diaphoresis  Had just had some punch with pinapple juice and cheerwine  No nausea or vohmiting no focal neuro signs.  Since then has felt fine.  No fevers chills Still has ringing in ears  ROS: Denies fever, malais, weight loss, blurry vision, decreased visual acuity, cough, sputum, SOB, hemoptysis, pleuritic pain, palpitaitons, heartburn, abdominal pain, melena, lower extremity edema, claudication, or rash.  All other systems reviewed and negative  General: Affect appropriate Healthy:  appears stated age HEENT: normal Neck supple with no adenopathy JVP normal no bruits no thyromegaly Lungs clear with no wheezing and good diaphragmatic motion Heart:  S1/S2 no murmur, no rub, gallop or click PMI normal Abdomen: benighn, BS positve, no tenderness, no AAA no bruit.  No HSM or HJR Distal pulses intact with no bruits No edema Neuro non-focal Skin warm and dry No muscular weakness   Current Outpatient Prescriptions  Medication Sig Dispense Refill  . aspirin EC 81 MG tablet Take 81 mg by mouth daily.      Marland Kitchen ezetimibe-simvastatin (VYTORIN) 10-40 MG per tablet Take 1 tablet by mouth at bedtime.      Marland Kitchen lisinopril (PRINIVIL,ZESTRIL) 40 MG tablet Take 40 mg by mouth daily.      . NON FORMULARY Beta blocker      . warfarin (COUMADIN) 5 MG tablet Take 5-7.5 mg by mouth daily. Patient takes 7.5mg  on Monday and Thursday and 5mg  all other days        Allergies  Erythromycin and Sulfonamide derivatives  Electrocardiogram:  02/21/12  SR rate 84  Normal ECG  Assessment and Plan

## 2012-03-02 NOTE — Assessment & Plan Note (Signed)
Recent vestibular incident appears resolved.  F/U echo and Rx BP more agressively

## 2012-03-02 NOTE — Addendum Note (Signed)
Addended by: Dossie Arbour on: 03/02/2012 12:09 PM   Modules accepted: Orders

## 2012-03-02 NOTE — Assessment & Plan Note (Signed)
Followed her INR;s Rx and was Rx at time of incident

## 2012-03-02 NOTE — Assessment & Plan Note (Signed)
Normal exam given vertigo check echo for funciton and ascending aortic size

## 2012-03-02 NOTE — Assessment & Plan Note (Signed)
F/U Dr Margo Aye continue beta blocker and titrate based on home readings

## 2012-03-10 ENCOUNTER — Ambulatory Visit (HOSPITAL_COMMUNITY): Payer: BC Managed Care – PPO | Attending: Cardiology

## 2012-03-10 DIAGNOSIS — I10 Essential (primary) hypertension: Secondary | ICD-10-CM | POA: Insufficient documentation

## 2012-03-10 DIAGNOSIS — I517 Cardiomegaly: Secondary | ICD-10-CM | POA: Insufficient documentation

## 2012-03-10 DIAGNOSIS — I059 Rheumatic mitral valve disease, unspecified: Secondary | ICD-10-CM

## 2012-03-10 DIAGNOSIS — R42 Dizziness and giddiness: Secondary | ICD-10-CM | POA: Insufficient documentation

## 2012-03-10 DIAGNOSIS — Z954 Presence of other heart-valve replacement: Secondary | ICD-10-CM

## 2012-03-10 NOTE — Progress Notes (Signed)
Echocardiogram performed.  

## 2012-03-14 ENCOUNTER — Telehealth: Payer: Self-pay | Admitting: Cardiovascular Disease

## 2012-03-14 NOTE — Telephone Encounter (Signed)
New Problem: ° ° ° °Patient returned your call.  Please call back. °

## 2012-03-14 NOTE — Telephone Encounter (Signed)
PT AWARE OF ECHO RESULTS./CY 

## 2012-05-18 ENCOUNTER — Ambulatory Visit (INDEPENDENT_AMBULATORY_CARE_PROVIDER_SITE_OTHER): Payer: BC Managed Care – PPO | Admitting: *Deleted

## 2012-05-18 ENCOUNTER — Encounter: Payer: Self-pay | Admitting: *Deleted

## 2012-05-18 DIAGNOSIS — I059 Rheumatic mitral valve disease, unspecified: Secondary | ICD-10-CM

## 2012-05-18 DIAGNOSIS — Z7901 Long term (current) use of anticoagulants: Secondary | ICD-10-CM

## 2012-05-18 DIAGNOSIS — Z952 Presence of prosthetic heart valve: Secondary | ICD-10-CM

## 2012-05-18 DIAGNOSIS — Z954 Presence of other heart-valve replacement: Secondary | ICD-10-CM

## 2012-05-18 LAB — POCT INR: INR: 2.6

## 2012-05-18 NOTE — Progress Notes (Signed)
This encounter was created in error - please disregard.

## 2012-08-10 ENCOUNTER — Ambulatory Visit (INDEPENDENT_AMBULATORY_CARE_PROVIDER_SITE_OTHER): Payer: BC Managed Care – PPO | Admitting: *Deleted

## 2012-08-10 DIAGNOSIS — Z952 Presence of prosthetic heart valve: Secondary | ICD-10-CM

## 2012-08-10 DIAGNOSIS — Z7901 Long term (current) use of anticoagulants: Secondary | ICD-10-CM

## 2012-08-10 DIAGNOSIS — I059 Rheumatic mitral valve disease, unspecified: Secondary | ICD-10-CM

## 2012-08-10 DIAGNOSIS — Z954 Presence of other heart-valve replacement: Secondary | ICD-10-CM

## 2012-08-10 LAB — POCT INR: INR: 3.5

## 2012-09-21 ENCOUNTER — Ambulatory Visit (INDEPENDENT_AMBULATORY_CARE_PROVIDER_SITE_OTHER): Payer: BC Managed Care – PPO | Admitting: *Deleted

## 2012-09-21 DIAGNOSIS — Z952 Presence of prosthetic heart valve: Secondary | ICD-10-CM

## 2012-09-21 DIAGNOSIS — I059 Rheumatic mitral valve disease, unspecified: Secondary | ICD-10-CM

## 2012-09-21 DIAGNOSIS — Z954 Presence of other heart-valve replacement: Secondary | ICD-10-CM

## 2012-09-21 DIAGNOSIS — Z7901 Long term (current) use of anticoagulants: Secondary | ICD-10-CM

## 2012-11-02 ENCOUNTER — Ambulatory Visit (INDEPENDENT_AMBULATORY_CARE_PROVIDER_SITE_OTHER): Payer: BC Managed Care – PPO | Admitting: *Deleted

## 2012-11-02 DIAGNOSIS — Z7901 Long term (current) use of anticoagulants: Secondary | ICD-10-CM

## 2012-11-02 DIAGNOSIS — Z954 Presence of other heart-valve replacement: Secondary | ICD-10-CM

## 2012-11-02 DIAGNOSIS — I059 Rheumatic mitral valve disease, unspecified: Secondary | ICD-10-CM

## 2012-11-02 DIAGNOSIS — Z952 Presence of prosthetic heart valve: Secondary | ICD-10-CM

## 2012-11-03 ENCOUNTER — Other Ambulatory Visit: Payer: Self-pay | Admitting: *Deleted

## 2012-11-03 MED ORDER — WARFARIN SODIUM 5 MG PO TABS: 5.0000 mg | ORAL_TABLET | Freq: Every day | ORAL | Status: DC

## 2012-12-21 ENCOUNTER — Ambulatory Visit (INDEPENDENT_AMBULATORY_CARE_PROVIDER_SITE_OTHER): Payer: BC Managed Care – PPO | Admitting: *Deleted

## 2012-12-21 DIAGNOSIS — Z954 Presence of other heart-valve replacement: Secondary | ICD-10-CM

## 2012-12-21 DIAGNOSIS — Z952 Presence of prosthetic heart valve: Secondary | ICD-10-CM

## 2012-12-21 DIAGNOSIS — I059 Rheumatic mitral valve disease, unspecified: Secondary | ICD-10-CM

## 2012-12-21 DIAGNOSIS — Z7901 Long term (current) use of anticoagulants: Secondary | ICD-10-CM

## 2012-12-21 LAB — POCT INR: INR: 3.5

## 2013-02-01 ENCOUNTER — Ambulatory Visit (INDEPENDENT_AMBULATORY_CARE_PROVIDER_SITE_OTHER): Payer: BC Managed Care – PPO | Admitting: *Deleted

## 2013-02-01 DIAGNOSIS — I059 Rheumatic mitral valve disease, unspecified: Secondary | ICD-10-CM

## 2013-02-01 DIAGNOSIS — Z954 Presence of other heart-valve replacement: Secondary | ICD-10-CM

## 2013-02-01 DIAGNOSIS — Z7901 Long term (current) use of anticoagulants: Secondary | ICD-10-CM

## 2013-02-01 DIAGNOSIS — Z952 Presence of prosthetic heart valve: Secondary | ICD-10-CM

## 2013-03-19 ENCOUNTER — Ambulatory Visit (INDEPENDENT_AMBULATORY_CARE_PROVIDER_SITE_OTHER): Payer: BC Managed Care – PPO | Admitting: *Deleted

## 2013-03-19 DIAGNOSIS — Z952 Presence of prosthetic heart valve: Secondary | ICD-10-CM

## 2013-03-19 DIAGNOSIS — I059 Rheumatic mitral valve disease, unspecified: Secondary | ICD-10-CM

## 2013-03-19 DIAGNOSIS — Z7901 Long term (current) use of anticoagulants: Secondary | ICD-10-CM

## 2013-03-19 DIAGNOSIS — Z954 Presence of other heart-valve replacement: Secondary | ICD-10-CM

## 2013-04-30 ENCOUNTER — Ambulatory Visit (INDEPENDENT_AMBULATORY_CARE_PROVIDER_SITE_OTHER): Payer: BC Managed Care – PPO | Admitting: *Deleted

## 2013-04-30 DIAGNOSIS — I059 Rheumatic mitral valve disease, unspecified: Secondary | ICD-10-CM

## 2013-04-30 DIAGNOSIS — Z952 Presence of prosthetic heart valve: Secondary | ICD-10-CM

## 2013-04-30 DIAGNOSIS — Z7901 Long term (current) use of anticoagulants: Secondary | ICD-10-CM

## 2013-04-30 DIAGNOSIS — Z954 Presence of other heart-valve replacement: Secondary | ICD-10-CM

## 2013-04-30 LAB — POCT INR: INR: 3.6

## 2013-06-11 ENCOUNTER — Ambulatory Visit (INDEPENDENT_AMBULATORY_CARE_PROVIDER_SITE_OTHER): Payer: BC Managed Care – PPO | Admitting: *Deleted

## 2013-06-11 DIAGNOSIS — Z952 Presence of prosthetic heart valve: Secondary | ICD-10-CM

## 2013-06-11 DIAGNOSIS — Z7901 Long term (current) use of anticoagulants: Secondary | ICD-10-CM

## 2013-06-11 DIAGNOSIS — I059 Rheumatic mitral valve disease, unspecified: Secondary | ICD-10-CM

## 2013-06-11 DIAGNOSIS — Z954 Presence of other heart-valve replacement: Secondary | ICD-10-CM

## 2013-06-11 LAB — POCT INR: INR: 3.8

## 2013-07-02 ENCOUNTER — Ambulatory Visit (INDEPENDENT_AMBULATORY_CARE_PROVIDER_SITE_OTHER): Payer: BC Managed Care – PPO | Admitting: *Deleted

## 2013-07-02 DIAGNOSIS — Z954 Presence of other heart-valve replacement: Secondary | ICD-10-CM

## 2013-07-02 DIAGNOSIS — Z952 Presence of prosthetic heart valve: Secondary | ICD-10-CM

## 2013-07-02 DIAGNOSIS — I059 Rheumatic mitral valve disease, unspecified: Secondary | ICD-10-CM

## 2013-07-02 DIAGNOSIS — Z7901 Long term (current) use of anticoagulants: Secondary | ICD-10-CM

## 2013-07-02 LAB — POCT INR: INR: 3.4

## 2013-07-16 ENCOUNTER — Encounter: Payer: Self-pay | Admitting: Cardiovascular Disease

## 2013-07-16 ENCOUNTER — Ambulatory Visit (INDEPENDENT_AMBULATORY_CARE_PROVIDER_SITE_OTHER): Payer: BC Managed Care – PPO | Admitting: Cardiovascular Disease

## 2013-07-16 VITALS — BP 122/82 | HR 73 | Wt 203.0 lb

## 2013-07-16 DIAGNOSIS — I10 Essential (primary) hypertension: Secondary | ICD-10-CM

## 2013-07-16 DIAGNOSIS — Z7901 Long term (current) use of anticoagulants: Secondary | ICD-10-CM

## 2013-07-16 DIAGNOSIS — E785 Hyperlipidemia, unspecified: Secondary | ICD-10-CM

## 2013-07-16 DIAGNOSIS — I4949 Other premature depolarization: Secondary | ICD-10-CM

## 2013-07-16 DIAGNOSIS — Z954 Presence of other heart-valve replacement: Secondary | ICD-10-CM

## 2013-07-16 DIAGNOSIS — Z952 Presence of prosthetic heart valve: Secondary | ICD-10-CM

## 2013-07-16 NOTE — Patient Instructions (Signed)
Your physician wants you to follow-up in: YEAR WITH DR NISHAN  You will receive a reminder letter in the mail two months in advance. If you don't receive a letter, please call our office to schedule the follow-up appointment.  Your physician recommends that you continue on your current medications as directed. Please refer to the Current Medication list given to you today. 

## 2013-07-16 NOTE — Assessment & Plan Note (Signed)
Rx INR with no bleeding diathesis  F/U coumadin clinic 

## 2013-07-16 NOTE — Assessment & Plan Note (Signed)
Cholesterol is at goal.  Continue current dose of statin and diet Rx.  No myalgias or side effects.  F/U  LFT's in 6 months. Lab Results  Component Value Date   LDLCALC 64 05/16/2008  Labs with Catalina PizzaZach Hall reviewed and LFTls normal LDL under 100

## 2013-07-16 NOTE — Assessment & Plan Note (Signed)
Crisp S1 valve click  Mild MR murmur Maint NSR  Asymptomatic and active Consider f/u echo in a year

## 2013-07-16 NOTE — Assessment & Plan Note (Signed)
Well controlled.  Continue current medications and low sodium Dash type diet.    

## 2013-07-16 NOTE — Assessment & Plan Note (Addendum)
No palpitations None on ECG or in office today EF normal  Continue bystolic

## 2013-07-16 NOTE — Progress Notes (Signed)
Patient ID: Luis Haynes, male   DOB: 09/06/1950, 63 y.o.   MRN: [id number] Luis MaduroRobert is seen today in followup for his hypertension and mitral  valve replacement. Last echo 2013 normal MVR function  He is due to have his Coumadin checked today. He has been therapeutic in general. There's been no bleeding diathesis. He's been following his SBE prophylaxis. He's been compliant with his blood pressure medication. Otherwise he's not had any significant chest pain PND or orthopnea. Has been no TIA or CVA. Has been no signs of heart failure Has ? venous insuficiency with pain in LE;s that has been markedly improved with support hose INR;s have been Rx and following in coumadin clinic regularlry  Echo 2013   Mitral valve: A mechanical prosthesis was present. Doppler: Transvalvular velocity was within the normal range. There was no evidence for stenosis. No regurgitation. Valve area by pressure half-time: 2.06cm^2. Indexed valve area by pressure half-time: 0.96cm^2/m^2. Valve area by continuity equation (using LVOT flow): 2.55cm^2. Indexed valve area by continuity equation (using LVOT flow): 1.19cm^2/m^2. Mean gradient: 4mm Hg (D). Peak gradient: 7mm Hg (D).  No complaints  Sees dentist twice a year and no issues INR;s Rx with checks every 4-6 weeks with Luis Haynes in [place]   ROS: Denies fever, malais, weight loss, blurry vision, decreased visual acuity, cough, sputum, SOB, hemoptysis, pleuritic pain, palpitaitons, heartburn, abdominal pain, melena, lower extremity edema, claudication, or rash.  All other systems reviewed and negative  General: Affect appropriate Healthy:  appears stated age HEENT: normal Neck supple with no adenopathy JVP normal no bruits no thyromegaly Lungs clear with no wheezing and good diaphragmatic motion Heart:  S1 click /S2 midl MR  murmur, no rub, gallop or click PMI normal Abdomen: benighn, BS positve, no tenderness, no AAA no bruit.  No HSM or HJR Distal pulses intact  with no bruits No edema Neuro non-focal Skin warm and dry No muscular weakness   Current Outpatient Prescriptions  Medication Sig Dispense Refill  . aspirin EC 81 MG tablet Take 81 mg by mouth daily.      Marland Kitchen. ezetimibe-simvastatin (VYTORIN) 10-40 MG per tablet Take 1 tablet by mouth at bedtime.      Marland Kitchen. lisinopril (PRINIVIL,ZESTRIL) 40 MG tablet Take 40 mg by mouth daily.      . nebivolol (BYSTOLIC) 5 MG tablet Take 5 mg by mouth daily.      Marland Kitchen. warfarin (COUMADIN) 5 MG tablet Take 1-1.5 tablets (5-7.5 mg total) by mouth daily. Patient takes 7.5mg  on Monday and Thursday and 5mg  all other days  45 tablet  6   No current facility-administered medications for this visit.    Allergies  Erythromycin and Sulfonamide derivatives  Electrocardiogram:  NSR rate 73 RBBB LAD LAE possible old inf/ant MI's   Assessment and Plan

## 2013-08-08 ENCOUNTER — Telehealth: Payer: Self-pay | Admitting: *Deleted

## 2013-08-08 MED ORDER — WARFARIN SODIUM 5 MG PO TABS: ORAL_TABLET | ORAL | Status: DC

## 2013-08-08 NOTE — Telephone Encounter (Signed)
WARFARIN 5MG #45 

## 2013-08-23 ENCOUNTER — Ambulatory Visit (INDEPENDENT_AMBULATORY_CARE_PROVIDER_SITE_OTHER): Payer: BC Managed Care – PPO | Admitting: *Deleted

## 2013-08-23 DIAGNOSIS — Z952 Presence of prosthetic heart valve: Secondary | ICD-10-CM

## 2013-08-23 DIAGNOSIS — Z7901 Long term (current) use of anticoagulants: Secondary | ICD-10-CM

## 2013-08-23 DIAGNOSIS — I059 Rheumatic mitral valve disease, unspecified: Secondary | ICD-10-CM

## 2013-08-23 DIAGNOSIS — Z954 Presence of other heart-valve replacement: Secondary | ICD-10-CM

## 2013-08-23 LAB — POCT INR: INR: 4

## 2013-09-24 ENCOUNTER — Telehealth: Payer: Self-pay | Admitting: Cardiovascular Disease

## 2013-09-24 NOTE — Telephone Encounter (Signed)
New message     Having a wisdom tooth extracted this thurs.  Pt can't remember what to do with his coumadin.  He has his presc for antibiotics but should he stop his coumadin?  OK to leave a vm message.

## 2013-09-24 NOTE — Telephone Encounter (Signed)
LEFT MESSAGE FOR P T  TO CALL BACK ./CY 

## 2013-09-24 NOTE — Telephone Encounter (Signed)
That's up to dentist  If he wants blood thicker should hold coumadin 3 days before wisdom teeth pulled

## 2013-09-24 NOTE — Telephone Encounter (Signed)
FORWARDED TO DR NISHAN  FOR REVIEW ./CY 

## 2013-09-25 NOTE — Telephone Encounter (Signed)
Received call from patient Dr.Nishan advised up to dentist if he wants blood thicker should hold coumadin 3 days before wisdom teeth extraction.

## 2013-10-29 ENCOUNTER — Ambulatory Visit (INDEPENDENT_AMBULATORY_CARE_PROVIDER_SITE_OTHER): Payer: BC Managed Care – PPO | Admitting: *Deleted

## 2013-10-29 DIAGNOSIS — Z954 Presence of other heart-valve replacement: Secondary | ICD-10-CM

## 2013-10-29 DIAGNOSIS — Z7901 Long term (current) use of anticoagulants: Secondary | ICD-10-CM

## 2013-10-29 DIAGNOSIS — I059 Rheumatic mitral valve disease, unspecified: Secondary | ICD-10-CM

## 2013-10-29 DIAGNOSIS — Z952 Presence of prosthetic heart valve: Secondary | ICD-10-CM

## 2013-10-29 LAB — POCT INR: INR: 3.7

## 2013-12-17 ENCOUNTER — Ambulatory Visit (INDEPENDENT_AMBULATORY_CARE_PROVIDER_SITE_OTHER): Payer: BC Managed Care – PPO | Admitting: *Deleted

## 2013-12-17 DIAGNOSIS — Z7901 Long term (current) use of anticoagulants: Secondary | ICD-10-CM

## 2013-12-17 DIAGNOSIS — Z954 Presence of other heart-valve replacement: Secondary | ICD-10-CM

## 2013-12-17 DIAGNOSIS — Z952 Presence of prosthetic heart valve: Secondary | ICD-10-CM

## 2013-12-17 DIAGNOSIS — I059 Rheumatic mitral valve disease, unspecified: Secondary | ICD-10-CM

## 2013-12-17 LAB — POCT INR: INR: 3.5

## 2014-01-07 ENCOUNTER — Ambulatory Visit (INDEPENDENT_AMBULATORY_CARE_PROVIDER_SITE_OTHER): Payer: BC Managed Care – PPO | Admitting: *Deleted

## 2014-01-07 DIAGNOSIS — Z954 Presence of other heart-valve replacement: Secondary | ICD-10-CM

## 2014-01-07 DIAGNOSIS — Z7901 Long term (current) use of anticoagulants: Secondary | ICD-10-CM

## 2014-01-07 DIAGNOSIS — Z952 Presence of prosthetic heart valve: Secondary | ICD-10-CM

## 2014-01-07 DIAGNOSIS — I059 Rheumatic mitral valve disease, unspecified: Secondary | ICD-10-CM

## 2014-01-07 LAB — POCT INR: INR: 5

## 2014-01-28 ENCOUNTER — Ambulatory Visit (INDEPENDENT_AMBULATORY_CARE_PROVIDER_SITE_OTHER): Payer: BC Managed Care – PPO | Admitting: *Deleted

## 2014-01-28 DIAGNOSIS — Z954 Presence of other heart-valve replacement: Secondary | ICD-10-CM

## 2014-01-28 DIAGNOSIS — Z7901 Long term (current) use of anticoagulants: Secondary | ICD-10-CM

## 2014-01-28 DIAGNOSIS — I059 Rheumatic mitral valve disease, unspecified: Secondary | ICD-10-CM

## 2014-01-28 DIAGNOSIS — Z952 Presence of prosthetic heart valve: Secondary | ICD-10-CM

## 2014-01-28 LAB — POCT INR: INR: 2.5

## 2014-02-25 ENCOUNTER — Ambulatory Visit (INDEPENDENT_AMBULATORY_CARE_PROVIDER_SITE_OTHER): Payer: BC Managed Care – PPO | Admitting: *Deleted

## 2014-02-25 DIAGNOSIS — I059 Rheumatic mitral valve disease, unspecified: Secondary | ICD-10-CM

## 2014-02-25 DIAGNOSIS — Z7901 Long term (current) use of anticoagulants: Secondary | ICD-10-CM

## 2014-02-25 DIAGNOSIS — Z952 Presence of prosthetic heart valve: Secondary | ICD-10-CM

## 2014-02-25 DIAGNOSIS — Z954 Presence of other heart-valve replacement: Secondary | ICD-10-CM

## 2014-02-25 LAB — POCT INR: INR: 3.5

## 2014-03-06 ENCOUNTER — Ambulatory Visit (INDEPENDENT_AMBULATORY_CARE_PROVIDER_SITE_OTHER): Payer: BC Managed Care – PPO | Admitting: Internal Medicine

## 2014-03-07 ENCOUNTER — Encounter (INDEPENDENT_AMBULATORY_CARE_PROVIDER_SITE_OTHER): Payer: Self-pay | Admitting: Internal Medicine

## 2014-03-07 ENCOUNTER — Telehealth (INDEPENDENT_AMBULATORY_CARE_PROVIDER_SITE_OTHER): Payer: Self-pay | Admitting: *Deleted

## 2014-03-07 ENCOUNTER — Ambulatory Visit (INDEPENDENT_AMBULATORY_CARE_PROVIDER_SITE_OTHER): Payer: BC Managed Care – PPO | Admitting: Internal Medicine

## 2014-03-07 ENCOUNTER — Other Ambulatory Visit (INDEPENDENT_AMBULATORY_CARE_PROVIDER_SITE_OTHER): Payer: Self-pay | Admitting: *Deleted

## 2014-03-07 ENCOUNTER — Telehealth: Payer: Self-pay | Admitting: Cardiovascular Disease

## 2014-03-07 VITALS — BP 112/70 | HR 72 | Temp 98.8°F | Ht 73.0 in | Wt 200.0 lb

## 2014-03-07 DIAGNOSIS — Z1211 Encounter for screening for malignant neoplasm of colon: Secondary | ICD-10-CM

## 2014-03-07 DIAGNOSIS — K625 Hemorrhage of anus and rectum: Secondary | ICD-10-CM | POA: Insufficient documentation

## 2014-03-07 DIAGNOSIS — K921 Melena: Secondary | ICD-10-CM

## 2014-03-07 NOTE — Telephone Encounter (Signed)
Spoke with Thurston HoleAnne in Dr. Patty Sermonsehman's office . Pt is scheduled for a colonoscopy on 03/29/14 The MD would like for pt to stop warfarin 5 days prior the procedure. Thurston Holenne states that Dr. Karilyn Cotaehman is  using [facility] so we can route clearance to him.

## 2014-03-07 NOTE — Telephone Encounter (Signed)
Follow up     addendum to message  Adding office phone number

## 2014-03-07 NOTE — Telephone Encounter (Signed)
Left Anne a message to call back 

## 2014-03-07 NOTE — Telephone Encounter (Signed)
New Msg   Anne from Dr. Patty Sermonsehman's office calling to see if its ok for patient to stop Warfarin 5 days before scheduled endoscopy on 03/29/2014

## 2014-03-07 NOTE — Telephone Encounter (Signed)
Patient needs movi prep 

## 2014-03-07 NOTE — Telephone Encounter (Signed)
Patient has a mechanical mitral valve.  Should stop coumadin 4 days before procedure but then needs lovenox bridging post procedure until INR back over 2.5  Pleas arrange through [place] office

## 2014-03-07 NOTE — Progress Notes (Addendum)
   Subjective:    Patient ID: Luis NatalRobert Ekstein, male    DOB: 01/15/1951, 63 y.o.   MRN: [id number] HPI Referred by Dr. Dwana MelenaZack Hall for GI bleed. Apparently had some lower abdominal discomfort and a loose stool. Noticed blood in toilet and on tissue.  Stool was normal in color.  Noticed the rectal bleeding last Tuesday in the afternoon.  He describes the bleeding as a lot. Occurred over a period of 2 days.  Stools were smaller.  Stools were darker in color. He has some bloating.   On Friday his stool was black. By Saturday and Sunday they were normal color and no blood. There was no  Fever associated with his symptoms.  Appetite is good. No weight loss. No acid reflux.  Has been taken Advil 3-4 days before the rectal bleeding. Taking 2-3 a day. Does not take on a regular basis. Now he is having a BM 1-2 a day. Stools are normal size. Last colonoscopy 8-9 yrs ago in [place] and normal perpatient.    No family hx of colon cancer. Hxof Mitral valve replacement and on chronic coumadin therapy.  02/27/2014 WBC 12.9, H and H 15.3 and 45.3, Platelet ct 297. Glucose 136, BUN 20, Creatinine 1.13, total bili 0.9, ALP 78, AST 24, ALT 17. Albumin 3.9. 03/11/2012 EF 55-60% Review of Systems Past Medical History  Diagnosis Date  . H/O mitral valve replacement     10  yrs ago  . Hypertension   . HYPERLIPIDEMIA   . MITRAL VALVE PROLAPSE   . PREMATURE VENTRICULAR CONTRACTIONS   . ALLERGIC RHINITIS   . Long term current use of anticoagulant     Past Surgical History  Procedure Laterality Date  . Shoulder surgery    . Mitral valve replacement      Allergies  Allergen Reactions  . Erythromycin   . Sulfonamide Derivatives     Current Outpatient Prescriptions on File Prior to Visit  Medication Sig Dispense Refill  . ezetimibe-simvastatin (VYTORIN) 10-40 MG per tablet Take 1 tablet by mouth at bedtime.    Marland Kitchen. lisinopril (PRINIVIL,ZESTRIL) 40 MG tablet Take 40 mg by mouth daily.    . nebivolol  (BYSTOLIC) 5 MG tablet Take 5 mg by mouth daily.    Marland Kitchen. warfarin (COUMADIN) 5 MG tablet Patient takes 7.5mg  on Thursdays and 5mg  all other days (Patient taking differently: Take 5-7.5 mg by mouth See admin instructions. Patient takes 7.5mg  on Thursdays and Mondays. and 5mg  all other days) 45 tablet 6   No current facility-administered medications on file prior to visit.        Objective:   Physical Exam  Filed Vitals:   03/07/14 0931  Height: 6\' 1"  (1.854 m)  Weight: 200 lb (90.719 kg)   Alert and oriented. Skin warm and dry. Oral mucosa is moist.   . Sclera anicteric, conjunctivae is pink. Thyroid not enlarged. No cervical lymphadenopathy. Lungs clear. Heart regular rate and rhythm.  Abdomen is soft. Bowel sounds are positive. No hepatomegaly. No abdominal masses felt. No tenderness.  No edema to lower extremities. Stool light brown and guaiac negative.         Assessment & Plan:  Rectal bleeding/ melena Colonic neoplasm, AVM, diverticular bleed needs to be ruled out. PUD also needs to be ruled out. Colonoscopy/EGD. The risks and benefits such as perforation, bleeding, and infection were reviewed with the patient and is agreeable.

## 2014-03-07 NOTE — Telephone Encounter (Signed)
Anne at Dr Patty Sermonsehman's office is aware of  Dr. Eden EmmsNishan recommendations. Saff message send to Vashti HeyLisa [facility] clinic nurse in charge of the pt's coumadin checks: Pt is to be off coumadin for 4 days prior the colonoscopy scheduled on 03/29/14, last coumadin dose will be 03/24/14  and  To arrange pt  Lovenox bridging post Procedure until INR is back over 2.5.

## 2014-03-07 NOTE — Patient Instructions (Signed)
EGD/Colonoscopy. The risks and benefits such as perforation, bleeding, and infection were reviewed with the patient and is agreeable. 

## 2014-03-08 MED ORDER — PEG-KCL-NACL-NASULF-NA ASC-C 100 G PO SOLR: 1.0000 | Freq: Once | ORAL | Status: DC

## 2014-03-09 NOTE — Telephone Encounter (Signed)
Dr. Ricki MillerNishans recommendations noted

## 2014-03-19 ENCOUNTER — Encounter (INDEPENDENT_AMBULATORY_CARE_PROVIDER_SITE_OTHER): Payer: Self-pay

## 2014-03-21 MED ORDER — ENOXAPARIN SODIUM 100 MG/ML ~~LOC~~ SOLN: SUBCUTANEOUS | Status: DC

## 2014-03-21 NOTE — Telephone Encounter (Signed)
12/6  Take last dose of coumadin 12/7  No coumadin 12/8  No coumadin 12/9  No coumadin  12/10  No coumadin 12/11  Colonoscopy .Marland Kitchen...coumadin 7.5mg  pm if OK with Dr Karilyn Cotaehman 12/12  Lovenox 90mg  am & pm and coumadin 7.5mg  pm 12/13  Lovenox 90mg  am & pm and coumadin 7.5mg  pm 12/14   Lovenox 90mg  am & pm and coumadin 7.5mg  pm 12/15   Lovenox 90mg  am & pm and coumadin 5mg  pm 12/16  Lovenox 90mg  am and INR appt @ 4:00pm  12/3  Rx for Lovenox 100mg  [facility] 02/27/14  SrCr 1.13   Hgb 15.3/Hct 45.3   Wt 90kg

## 2014-03-29 ENCOUNTER — Encounter (HOSPITAL_COMMUNITY)
Admission: RE | Disposition: A | Discharge status: Home / Self Care | Payer: Self-pay | Source: Ambulatory Visit | Attending: Internal Medicine

## 2014-03-29 ENCOUNTER — Encounter (HOSPITAL_COMMUNITY): Payer: Self-pay | Admitting: *Deleted

## 2014-03-29 ENCOUNTER — Ambulatory Visit (HOSPITAL_COMMUNITY)
Admission: RE | Admit: 2014-03-29 | Discharge: 2014-03-29 | Disposition: A | Discharge status: Home / Self Care | Payer: BC Managed Care – PPO | Source: Ambulatory Visit | Attending: Internal Medicine | Admitting: Internal Medicine

## 2014-03-29 DIAGNOSIS — K2900 Acute gastritis without bleeding: Secondary | ICD-10-CM

## 2014-03-29 DIAGNOSIS — I10 Essential (primary) hypertension: Secondary | ICD-10-CM | POA: Insufficient documentation

## 2014-03-29 DIAGNOSIS — K2960 Other gastritis without bleeding: Secondary | ICD-10-CM | POA: Insufficient documentation

## 2014-03-29 DIAGNOSIS — Z882 Allergy status to sulfonamides status: Secondary | ICD-10-CM | POA: Diagnosis not present

## 2014-03-29 DIAGNOSIS — Z952 Presence of prosthetic heart valve: Secondary | ICD-10-CM | POA: Diagnosis not present

## 2014-03-29 DIAGNOSIS — K449 Diaphragmatic hernia without obstruction or gangrene: Secondary | ICD-10-CM | POA: Diagnosis not present

## 2014-03-29 DIAGNOSIS — K208 Other esophagitis: Secondary | ICD-10-CM

## 2014-03-29 DIAGNOSIS — K648 Other hemorrhoids: Secondary | ICD-10-CM | POA: Diagnosis not present

## 2014-03-29 DIAGNOSIS — K5730 Diverticulosis of large intestine without perforation or abscess without bleeding: Secondary | ICD-10-CM | POA: Diagnosis not present

## 2014-03-29 DIAGNOSIS — E785 Hyperlipidemia, unspecified: Secondary | ICD-10-CM | POA: Insufficient documentation

## 2014-03-29 DIAGNOSIS — K625 Hemorrhage of anus and rectum: Secondary | ICD-10-CM

## 2014-03-29 DIAGNOSIS — Z7901 Long term (current) use of anticoagulants: Secondary | ICD-10-CM | POA: Diagnosis not present

## 2014-03-29 DIAGNOSIS — I341 Nonrheumatic mitral (valve) prolapse: Secondary | ICD-10-CM | POA: Insufficient documentation

## 2014-03-29 DIAGNOSIS — K921 Melena: Secondary | ICD-10-CM

## 2014-03-29 DIAGNOSIS — K210 Gastro-esophageal reflux disease with esophagitis: Secondary | ICD-10-CM | POA: Diagnosis not present

## 2014-03-29 DIAGNOSIS — Z881 Allergy status to other antibiotic agents status: Secondary | ICD-10-CM | POA: Diagnosis not present

## 2014-03-29 HISTORY — PX: COLONOSCOPY: SHX5424

## 2014-03-29 HISTORY — PX: ESOPHAGOGASTRODUODENOSCOPY: SHX5428

## 2014-03-29 SURGERY — COLONOSCOPY
Anesthesia: Moderate Sedation

## 2014-03-29 MED ORDER — CEFAZOLIN SODIUM-DEXTROSE 2-3 GM-% IV SOLR
2.0000 g | Freq: Once | INTRAVENOUS | Status: AC
  Administered 2014-03-29: 2 g via INTRAVENOUS

## 2014-03-29 MED ORDER — BUTAMBEN-TETRACAINE-BENZOCAINE 2-2-14 % EX AERO
INHALATION_SPRAY | CUTANEOUS | Status: DC | PRN
  Administered 2014-03-29: 2 via TOPICAL

## 2014-03-29 MED ORDER — SIMETHICONE 40 MG/0.6ML PO SUSP
ORAL | Status: AC
  Filled 2014-03-29: qty 1.2

## 2014-03-29 MED ORDER — STERILE WATER FOR IRRIGATION IR SOLN
Status: DC | PRN
  Administered 2014-03-29: 08:00:00

## 2014-03-29 MED ORDER — MEPERIDINE HCL 50 MG/ML IJ SOLN
INTRAMUSCULAR | Status: DC
Start: 2014-03-29 — End: 2014-03-29
  Filled 2014-03-29: qty 1

## 2014-03-29 MED ORDER — CEFAZOLIN SODIUM-DEXTROSE 2-3 GM-% IV SOLR
INTRAVENOUS | Status: AC
  Filled 2014-03-29: qty 50

## 2014-03-29 MED ORDER — PANTOPRAZOLE SODIUM 40 MG PO TBEC: 40.0000 mg | DELAYED_RELEASE_TABLET | Freq: Every day | ORAL | Status: DC

## 2014-03-29 MED ORDER — MIDAZOLAM HCL 5 MG/5ML IJ SOLN
INTRAMUSCULAR | Status: AC
  Filled 2014-03-29: qty 10

## 2014-03-29 MED ORDER — MEPERIDINE HCL 50 MG/ML IJ SOLN
INTRAMUSCULAR | Status: DC | PRN
  Administered 2014-03-29 (×2): 25 mg via INTRAVENOUS

## 2014-03-29 MED ORDER — SODIUM CHLORIDE 0.9 % IV SOLN
INTRAVENOUS | Status: DC
  Administered 2014-03-29: 07:00:00 via INTRAVENOUS

## 2014-03-29 MED ORDER — MIDAZOLAM HCL 5 MG/5ML IJ SOLN
INTRAMUSCULAR | Status: DC | PRN
  Administered 2014-03-29: 2 mg via INTRAVENOUS
  Administered 2014-03-29: 1 mg via INTRAVENOUS
  Administered 2014-03-29: 2 mg via INTRAVENOUS
  Administered 2014-03-29 (×2): 1 mg via INTRAVENOUS

## 2014-03-29 NOTE — Discharge Instructions (Signed)
Resume usual medications including warfarin. Start Lovenox or Enoxaparin as recommended. Pantoprazole 40 mg by mouth 30 minutes before breakfast daily High fiber diet. No driving for 24 hours. Physician will call with results of blood test(H pylori).  High-Fiber Diet Fiber is found in fruits, vegetables, and grains. A high-fiber diet encourages the addition of more whole grains, legumes, fruits, and vegetables in your diet. The recommended amount of fiber for adult males is 38 g per day. For adult females, it is 25 g per day. Pregnant and lactating women should get 28 g of fiber per day. If you have a digestive or bowel problem, ask your caregiver for advice before adding high-fiber foods to your diet. Eat a variety of high-fiber foods instead of only a select few type of foods.  PURPOSE  To increase stool bulk.  To make bowel movements more regular to prevent constipation.  To lower cholesterol.  To prevent overeating. WHEN IS THIS DIET USED?  It may be used if you have constipation and hemorrhoids.  It may be used if you have uncomplicated diverticulosis (intestine condition) and irritable bowel syndrome.  It may be used if you need help with weight management.  It may be used if you want to add it to your diet as a protective measure against atherosclerosis, diabetes, and cancer. SOURCES OF FIBER  Whole-grain breads and cereals.  Fruits, such as apples, oranges, bananas, berries, prunes, and pears.  Vegetables, such as green peas, carrots, sweet potatoes, beets, broccoli, cabbage, spinach, and artichokes.  Legumes, such split peas, soy, lentils.  Almonds. FIBER CONTENT IN FOODS Starches and Grains / Dietary Fiber (g)  Cheerios, 1 cup / 3 g  Corn Flakes cereal, 1 cup / 0.7 g  Rice crispy treat cereal, 1 cup / 0.3 g  Instant oatmeal (cooked),  cup / 2 g  Frosted wheat cereal, 1 cup / 5.1 g  Brown, long-grain rice (cooked), 1 cup / 3.5 g  Ulrick Methot, long-grain rice  (cooked), 1 cup / 0.6 g  Enriched macaroni (cooked), 1 cup / 2.5 g Legumes / Dietary Fiber (g)  Baked beans (canned, plain, or vegetarian),  cup / 5.2 g  Kidney beans (canned),  cup / 6.8 g  Pinto beans (cooked),  cup / 5.5 g Breads and Crackers / Dietary Fiber (g)  Plain or honey graham crackers, 2 squares / 0.7 g  Saltine crackers, 3 squares / 0.3 g  Plain, salted pretzels, 10 pieces / 1.8 g  Whole-wheat bread, 1 slice / 1.9 g  Ammiel Guiney bread, 1 slice / 0.7 g  Raisin bread, 1 slice / 1.2 g  Plain bagel, 3 oz / 2 g  Flour tortilla, 1 oz / 0.9 g  Corn tortilla, 1 small / 1.5 g  Hamburger or hotdog bun, 1 small / 0.9 g Fruits / Dietary Fiber (g)  Apple with skin, 1 medium / 4.4 g  Sweetened applesauce,  cup / 1.5 g  Banana,  medium / 1.5 g  Grapes, 10 grapes / 0.4 g  Orange, 1 small / 2.3 g  Raisin, 1.5 oz / 1.6 g  Melon, 1 cup / 1.4 g Vegetables / Dietary Fiber (g)  Green beans (canned),  cup / 1.3 g  Carrots (cooked),  cup / 2.3 g  Broccoli (cooked),  cup / 2.8 g  Peas (cooked),  cup / 4.4 g  Mashed potatoes,  cup / 1.6 g  Lettuce, 1 cup / 0.5 g  Corn (canned),  cup / 1.6  g  Tomato,  cup / 1.1 g Document Released: 04/05/2005 Document Revised: 10/05/2011 Document Reviewed: 07/08/2011 [facility] Patient Information 2015 [place], [place]. This information is not intended to replace advice given to you by your health care provider. Make sure you discuss any questions you have with your health care provider.  Helicobacter Pylori Antibodies Test This is a blood test which looks for a germ called Helicobacter pylori. This can also be diagnosed by a breath test or a microscopic examination of a portion (biopsy) of the small bowel. H. pylori is a germ that is found in the cells that line the stomach. It is a risk factor for stomach and small bowel ulcers, long-standing inflammation of the lining of the stomach, or even ulcers that may occur in the  esophagus (the canal that runs from the mouth to the stomach). This bacterium is also a factor in stomach cancer. The amount of the bacteria is found in about 10% of healthy persons younger than 64 years of age and the amount of the bacteria increases with age. Most persons with these bacteria have no symptoms; however, it is thought that when these bacteria cause ulcers, antibiotic medications can be used to help eliminate or reduce the problem.  PREPARATION FOR TEST No preparation or fasting is necessary. NORMAL FINDINGS Negative (H. pylori bacteria not present). Ranges for normal findings may vary among different laboratories and hospitals. You should always check with your doctor after having lab work or other tests done to discuss the meaning of your test results and whether or not your values are considered within normal limits. MEANING OF TEST  Your caregiver will go over the test results with you and discuss the importance and meaning of your results, as well as treatment options and the need for additional tests if necessary. OBTAINING THE TEST RESULTS It is your responsibility to obtain your test results. Ask the lab or department performing the test when and how you will get your results. Document Released: 04/29/2004 Document Revised: 08/20/2013 Document Reviewed: 03/16/2008 [facility] Patient Information 2015 [place], [place]. This information is not intended to replace advice given to you by your health care provider. Make sure you discuss any questions you have with your health care provider.   Colonoscopy, Care After Refer to this sheet in the next few weeks. These instructions provide you with information on caring for yourself after your procedure. Your health care provider may also give you more specific instructions. Your treatment has been planned according to current medical practices, but problems sometimes occur. Call your health care provider if you have any problems or questions  after your procedure. WHAT TO EXPECT AFTER THE PROCEDURE  After your procedure, it is typical to have the following:  A small amount of blood in your stool.  Moderate amounts of gas and mild abdominal cramping or bloating. HOME CARE INSTRUCTIONS  Do not drive, operate machinery, or sign important documents for 24 hours.  You may shower and resume your regular physical activities, but move at a slower pace for the first 24 hours.  Take frequent rest periods for the first 24 hours.  Walk around or put a warm pack on your abdomen to help reduce abdominal cramping and bloating.  Drink enough fluids to keep your urine clear or pale yellow.  You may resume your normal diet as instructed by your health care provider. Avoid heavy or fried foods that are hard to digest.  Avoid drinking alcohol for 24 hours or as instructed  by your health care provider.  Only take over-the-counter or prescription medicines as directed by your health care provider.  If a tissue sample (biopsy) was taken during your procedure:  Do not take aspirin or blood thinners for 7 days, or as instructed by your health care provider.  Do not drink alcohol for 7 days, or as instructed by your health care provider.  Eat soft foods for the first 24 hours. SEEK MEDICAL CARE IF: You have persistent spotting of blood in your stool 2-3 days after the procedure. SEEK IMMEDIATE MEDICAL CARE IF:  You have more than a small spotting of blood in your stool.  You pass large blood clots in your stool.  Your abdomen is swollen (distended).  You have nausea or vomiting.  You have a fever.  You have increasing abdominal pain that is not relieved with medicine. Document Released: 11/18/2003 Document Revised: 01/24/2013 Document Reviewed: 12/11/2012 [facility] Patient Information 2015 [place], [place]. This information is not intended to replace advice given to you by your health care provider. Make sure you discuss any questions  you have with your health care provider.

## 2014-03-29 NOTE — H&P (Signed)
Luis Haynes is an 63 y.o. male.   Chief Complaint: Patient is here for EGD and colonoscopy. HPI: Patient is 63 year old Caucasian male who had an episode of rectal bleeding followed by melena for 4 weeks ago. Rectal bleeding lasted for 2-3 days when he would notice blood with formed stools. He also noted achiness across lower abdomen. For screening started BX taken to 3 Advil twice daily for couple of days. Melena lasted for 2-3 days and then cleared. He has good appetite. He denies nausea vomiting heartburn or abdominal pain. Last EGD was 8-9 years ago and reveal small sliding hiatal hernia.  Last colonoscopy was 8-9 years ago and was normal. Family history is negative for CRC. Patient's warfarin is on hold. H&H on 02/27/2014 was 15.3 and 45.3.  Past Medical History  Diagnosis Date  . H/O mitral valve replacement     10 yrs ago  . Hypertension   . HYPERLIPIDEMIA   . MITRAL VALVE PROLAPSE   . PREMATURE VENTRICULAR CONTRACTIONS   . ALLERGIC RHINITIS   . Long term current use of anticoagulant     Past Surgical History  Procedure Laterality Date  . Shoulder surgery    . Mitral valve replacement      History reviewed. No pertinent family history. Social History:  reports that he has never smoked. He does not have any smokeless tobacco history on file. He reports that he drinks alcohol. He reports that he does not use illicit drugs.  Allergies:  Allergies  Allergen Reactions  . Amoxicillin     GI upset  . Erythromycin   . Sulfonamide Derivatives     Medications Prior to Admission  Medication Sig Dispense Refill  . ezetimibe-simvastatin (VYTORIN) 10-40 MG per tablet Take 1 tablet by mouth at bedtime.    . Fish Oil-Cholecalciferol (FISH OIL + D3 PO) Take by mouth.    Marland Kitchen lisinopril (PRINIVIL,ZESTRIL) 40 MG tablet Take 40 mg by mouth daily.    . nebivolol (BYSTOLIC) 5 MG tablet Take 5 mg by mouth daily.    . peg 3350 powder (MOVIPREP) 100 G SOLR Take 1 kit (200 g total) by  mouth once. 1 kit 0  . Probiotic Product (RESTORA PO) Take by mouth.    . warfarin (COUMADIN) 5 MG tablet Patient takes 7.$RemoveBeforeD'5mg'JbOABByjYzVCLn$  on Thursdays and $RemoveBefor'5mg'iihrWqvHCwEP$  all other days (Patient taking differently: Take 5-7.5 mg by mouth See admin instructions. Patient takes 7.$RemoveBeforeD'5mg'JqENuLXvzECdul$  on Thursdays and Mondays. and $RemoveBefo'5mg'tQqDWdBKfPB$  all other days) 45 tablet 6  . [START ON 03/30/2014] enoxaparin (LOVENOX) 100 MG/ML injection Inject $RemoveBeforeDE'90mg'ffEUNCxhkojEfbI$  sq 7am & 7pm 10 Syringe 1    No results found for this or any previous visit (from the past 48 hour(s)). No results found.  ROS  Blood pressure 127/85, pulse 73, temperature 98.2 F (36.8 C), temperature source Oral, resp. rate 18, height $RemoveBe'6\' 1"'JmRWBrQwR$  (1.854 m), weight 200 lb (90.719 kg), SpO2 100 %. Physical Exam  Constitutional: He appears well-developed and well-nourished.  HENT:  Mouth/Throat: Oropharynx is clear and moist.  Eyes: Conjunctivae are normal. No scleral icterus.  Neck: No thyromegaly present.  Cardiovascular:  No murmur heard. Regular rhythm prominent S1 normal S2 and no murmur.  Respiratory: Effort normal and breath sounds normal.  GI: He exhibits no distension and no mass. There is no tenderness.  Musculoskeletal: He exhibits no edema.  Lymphadenopathy:    He has no cervical adenopathy.  Neurological: He is alert.  Skin: Skin is warm and dry.     Assessment/Plan History of  rectal bleeding and melena. Diagnostic EGD and colonoscopy.  REHMAN,NAJEEB U 03/29/2014, 7:35 AM

## 2014-03-29 NOTE — Op Note (Signed)
EGD PROCEDURE REPORT  PATIENT:  Luis Haynes  MR#:  [id number] Birthdate:  02/07/1951, 63 y.o., male Endoscopist:  Dr. Malissa HippoNajeeb U. Rehman, MD Referred By:  Dr. Catalina PizzaZach Hall, MD  Procedure Date: 03/29/2014  Procedure:   EGD & Colonoscopy  Indications:  Patient is 63 year old Caucasian male who is chronically anticoagulated because he has a mechanical mitral valve. He experienced rectal bleeding lasting 3 days but one month ago followed by black stools. When the symptoms occurred he had taken a few doses of Advil. His H&H was normal. He denies nausea vomiting abdominal pain. Last EGD and colonoscopy were 8 or 9 years ago [place].            Informed Consent:  The risks, benefits, alternatives & imponderables which include, but are not limited to, bleeding, infection, perforation, drug reaction and potential missed lesion have been reviewed.  The potential for biopsy, lesion removal, esophageal dilation, etc. have also been discussed.  Questions have been answered.  All parties agreeable.  Please see history & physical in medical record for more information.  Medications:  Demerol 50 mg IV Versed 7 mg IV Cetacaine spray topically for oropharyngeal anesthesia  EGD  Description of procedure:  The endoscope was introduced through the mouth and advanced to the second portion of the duodenum without difficulty or limitations. The mucosal surfaces were surveyed very carefully during advancement of the scope and upon withdrawal.  Findings:  Esophagus:  Mucosa of the esophagus was normal. Erosion noted at GE junction.  GEJ:   37 cm Hiatus:  41 cm. focal erythema noted to mucosa of herniated part of the stomach along the posterior wall. Stomach:  Stomach was empty and distended very well with insufflation. Folds in the proximal stomach were normal. Long linear erosion noted at gastric body along the posterior wall. Erythema also noted to mucosa at pyloric channel. Pyloric  channel was patent. Angularis fundus and cardia were unremarkable. Duodenum:  Normal bulbar and post bulbar mucosa.  Therapeutic/Diagnostic Maneuvers Performed:  None  COLONOSCOPY Description of procedure:  After a digital rectal exam was performed, that colonoscope was advanced from the anus through the rectum and colon to the area of the cecum, ileocecal valve and appendiceal orifice. The cecum was deeply intubated. These structures were well-seen and photographed for the record. From the level of the cecum and ileocecal valve, the scope was slowly and cautiously withdrawn. The mucosal surfaces were carefully surveyed utilizing scope tip to flexion to facilitate fold flattening as needed. The scope was pulled down into the rectum where a thorough exam including retroflexion was performed.  Findings:   Prep satisfactory. Multiple diverticula noted at sigmoid colon and a few more scattered in rest of the colon. Normal rectal mucosa. Hemorrhoids above the dentate line.  Therapeutic/Diagnostic Maneuvers Performed:  None  Complications:  None  Cecal Withdrawal Time:  9 minutes  Impression:  EGD findings; Erosive reflux esophagitis. Moderate-sized sliding hiatal hernia. Erosive gastritis and pyloric channel inflammation.  Colonoscopy findings; Examination performed to cecum. Pancolonic diverticulosis. Most of the diverticula are located at sigmoid colon. Internal hemorrhoids.  Comment; He may have bled from right-sided diverticula.  Recommendations:  H. pylori serology.  Pantoprazole 40 mg by mouth every morning. Patient will resume warfarin at usual dose and begin Lovenox starting this morning and continue per cardiology recommendations. High fiber diet. Patient advised not to take OTC NSAIDs. I will be contacting patient with H. pylori results.  REHMAN,NAJEEB U  03/29/2014  8:33 AM  CC: Dr. Margo AyeHALL, [facility], MD & Dr. Bonnetta BarryNo ref. provider found

## 2014-04-01 ENCOUNTER — Encounter (HOSPITAL_COMMUNITY): Payer: Self-pay | Admitting: Internal Medicine

## 2014-04-01 LAB — H. PYLORI ANTIBODY, IGG: H PYLORI IGG: 0.58 {ISR}

## 2014-04-03 ENCOUNTER — Ambulatory Visit (INDEPENDENT_AMBULATORY_CARE_PROVIDER_SITE_OTHER): Payer: BC Managed Care – PPO | Admitting: *Deleted

## 2014-04-03 DIAGNOSIS — Z954 Presence of other heart-valve replacement: Secondary | ICD-10-CM

## 2014-04-03 DIAGNOSIS — Z952 Presence of prosthetic heart valve: Secondary | ICD-10-CM

## 2014-04-03 DIAGNOSIS — I059 Rheumatic mitral valve disease, unspecified: Secondary | ICD-10-CM

## 2014-04-03 DIAGNOSIS — Z7901 Long term (current) use of anticoagulants: Secondary | ICD-10-CM

## 2014-04-03 LAB — POCT INR: INR: 2.7

## 2014-05-06 ENCOUNTER — Other Ambulatory Visit: Payer: Self-pay | Admitting: *Deleted

## 2014-05-06 MED ORDER — WARFARIN SODIUM 5 MG PO TABS: 5.0000 mg | ORAL_TABLET | ORAL | Status: DC

## 2014-05-15 ENCOUNTER — Ambulatory Visit (INDEPENDENT_AMBULATORY_CARE_PROVIDER_SITE_OTHER): Payer: BC Managed Care – PPO | Admitting: *Deleted

## 2014-05-15 DIAGNOSIS — Z952 Presence of prosthetic heart valve: Secondary | ICD-10-CM

## 2014-05-15 DIAGNOSIS — Z954 Presence of other heart-valve replacement: Secondary | ICD-10-CM

## 2014-05-15 DIAGNOSIS — I059 Rheumatic mitral valve disease, unspecified: Secondary | ICD-10-CM

## 2014-05-15 DIAGNOSIS — Z7901 Long term (current) use of anticoagulants: Secondary | ICD-10-CM

## 2014-05-15 LAB — POCT INR: INR: 4.5

## 2014-06-12 ENCOUNTER — Ambulatory Visit (INDEPENDENT_AMBULATORY_CARE_PROVIDER_SITE_OTHER): Payer: BC Managed Care – PPO | Admitting: *Deleted

## 2014-06-12 DIAGNOSIS — Z954 Presence of other heart-valve replacement: Secondary | ICD-10-CM

## 2014-06-12 DIAGNOSIS — I059 Rheumatic mitral valve disease, unspecified: Secondary | ICD-10-CM

## 2014-06-12 DIAGNOSIS — Z952 Presence of prosthetic heart valve: Secondary | ICD-10-CM

## 2014-06-12 DIAGNOSIS — Z7901 Long term (current) use of anticoagulants: Secondary | ICD-10-CM

## 2014-06-12 LAB — POCT INR: INR: 4.4

## 2014-07-03 ENCOUNTER — Ambulatory Visit (INDEPENDENT_AMBULATORY_CARE_PROVIDER_SITE_OTHER): Payer: BC Managed Care – PPO | Admitting: *Deleted

## 2014-07-03 DIAGNOSIS — Z954 Presence of other heart-valve replacement: Secondary | ICD-10-CM

## 2014-07-03 DIAGNOSIS — Z952 Presence of prosthetic heart valve: Secondary | ICD-10-CM

## 2014-07-03 DIAGNOSIS — I059 Rheumatic mitral valve disease, unspecified: Secondary | ICD-10-CM

## 2014-07-03 DIAGNOSIS — Z7901 Long term (current) use of anticoagulants: Secondary | ICD-10-CM

## 2014-07-03 LAB — POCT INR: INR: 4.4

## 2014-07-09 ENCOUNTER — Telehealth (INDEPENDENT_AMBULATORY_CARE_PROVIDER_SITE_OTHER): Payer: Self-pay | Admitting: *Deleted

## 2014-07-09 NOTE — Telephone Encounter (Signed)
Patient's Insurance was called and a PA was done for the Pantoprazole 40 mg Tablets. This was completed with [employer]. Approved dates 06/09/14-07/09/15. Both our office and the patient will rec'v approval letter. [facility].

## 2014-07-17 ENCOUNTER — Ambulatory Visit (INDEPENDENT_AMBULATORY_CARE_PROVIDER_SITE_OTHER): Payer: BC Managed Care – PPO | Admitting: *Deleted

## 2014-07-17 DIAGNOSIS — I059 Rheumatic mitral valve disease, unspecified: Secondary | ICD-10-CM

## 2014-07-17 DIAGNOSIS — Z7901 Long term (current) use of anticoagulants: Secondary | ICD-10-CM | POA: Diagnosis not present

## 2014-07-17 DIAGNOSIS — Z954 Presence of other heart-valve replacement: Secondary | ICD-10-CM

## 2014-07-17 DIAGNOSIS — Z952 Presence of prosthetic heart valve: Secondary | ICD-10-CM

## 2014-07-17 LAB — POCT INR: INR: 4

## 2014-08-07 ENCOUNTER — Ambulatory Visit (INDEPENDENT_AMBULATORY_CARE_PROVIDER_SITE_OTHER): Payer: BC Managed Care – PPO | Admitting: *Deleted

## 2014-08-07 DIAGNOSIS — Z7901 Long term (current) use of anticoagulants: Secondary | ICD-10-CM | POA: Diagnosis not present

## 2014-08-07 DIAGNOSIS — I059 Rheumatic mitral valve disease, unspecified: Secondary | ICD-10-CM | POA: Diagnosis not present

## 2014-08-07 DIAGNOSIS — Z954 Presence of other heart-valve replacement: Secondary | ICD-10-CM

## 2014-08-07 DIAGNOSIS — Z952 Presence of prosthetic heart valve: Secondary | ICD-10-CM

## 2014-08-07 LAB — POCT INR: INR: 2.6

## 2014-09-09 ENCOUNTER — Ambulatory Visit (INDEPENDENT_AMBULATORY_CARE_PROVIDER_SITE_OTHER): Payer: BC Managed Care – PPO | Admitting: *Deleted

## 2014-09-09 DIAGNOSIS — Z7901 Long term (current) use of anticoagulants: Secondary | ICD-10-CM | POA: Diagnosis not present

## 2014-09-09 DIAGNOSIS — Z952 Presence of prosthetic heart valve: Secondary | ICD-10-CM

## 2014-09-09 DIAGNOSIS — Z954 Presence of other heart-valve replacement: Secondary | ICD-10-CM | POA: Diagnosis not present

## 2014-09-09 DIAGNOSIS — I059 Rheumatic mitral valve disease, unspecified: Secondary | ICD-10-CM

## 2014-09-09 LAB — POCT INR: INR: 2.8

## 2014-10-07 ENCOUNTER — Ambulatory Visit (INDEPENDENT_AMBULATORY_CARE_PROVIDER_SITE_OTHER): Payer: BC Managed Care – PPO | Admitting: *Deleted

## 2014-10-07 DIAGNOSIS — Z952 Presence of prosthetic heart valve: Secondary | ICD-10-CM

## 2014-10-07 DIAGNOSIS — I059 Rheumatic mitral valve disease, unspecified: Secondary | ICD-10-CM | POA: Diagnosis not present

## 2014-10-07 DIAGNOSIS — Z7901 Long term (current) use of anticoagulants: Secondary | ICD-10-CM | POA: Diagnosis not present

## 2014-10-07 DIAGNOSIS — Z954 Presence of other heart-valve replacement: Secondary | ICD-10-CM

## 2014-10-07 LAB — POCT INR: INR: 4.4

## 2014-10-09 ENCOUNTER — Other Ambulatory Visit (INDEPENDENT_AMBULATORY_CARE_PROVIDER_SITE_OTHER): Payer: Self-pay | Admitting: Internal Medicine

## 2014-10-28 ENCOUNTER — Ambulatory Visit (INDEPENDENT_AMBULATORY_CARE_PROVIDER_SITE_OTHER): Payer: BC Managed Care – PPO | Admitting: *Deleted

## 2014-10-28 DIAGNOSIS — I059 Rheumatic mitral valve disease, unspecified: Secondary | ICD-10-CM | POA: Diagnosis not present

## 2014-10-28 DIAGNOSIS — Z954 Presence of other heart-valve replacement: Secondary | ICD-10-CM | POA: Diagnosis not present

## 2014-10-28 DIAGNOSIS — Z7901 Long term (current) use of anticoagulants: Secondary | ICD-10-CM

## 2014-10-28 DIAGNOSIS — Z952 Presence of prosthetic heart valve: Secondary | ICD-10-CM

## 2014-10-28 LAB — POCT INR: INR: 2.7

## 2014-10-31 IMAGING — CR DG CHEST 1V PORT
1 series · 1 of 1 positions shown · non-contrast
Comparison: None.

CLINICAL DATA: 51-year-old male dizziness.

PORTABLE CHEST - 1 VIEW

[view not recorded]
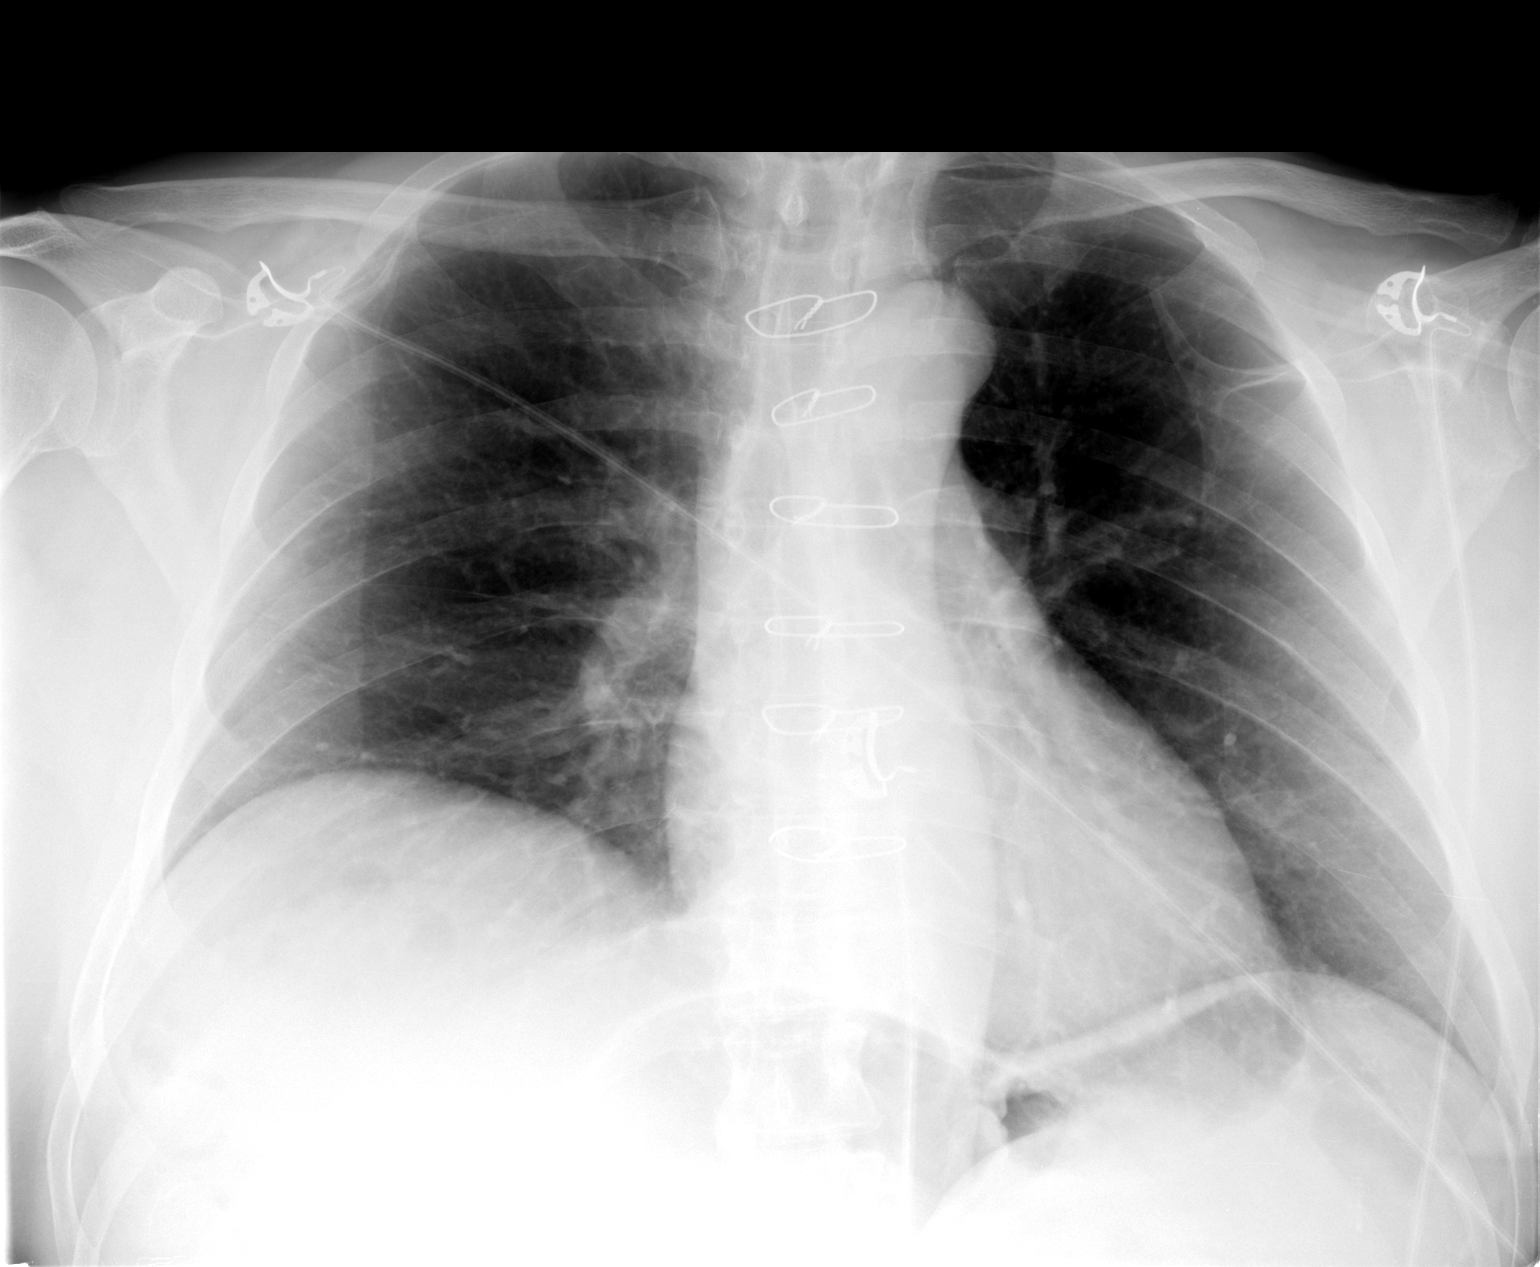

[1 of 1 positions shown; findings below may reference images not displayed]

FINDINGS: Portable AP upright view 1215 hours.  Mild elevation of
the right hemidiaphragm.  Overall the low normal lung volumes.
Previous median sternotomy.  Cardiac size and mediastinal contours
are within normal limits.  Visualized tracheal air column is within
normal limits.  No pneumothorax, pulmonary edema, pleural effusion
or confluent pulmonary opacity.
IMPRESSION: No acute cardiopulmonary abnormality.

## 2014-11-27 ENCOUNTER — Ambulatory Visit (INDEPENDENT_AMBULATORY_CARE_PROVIDER_SITE_OTHER): Payer: BC Managed Care – PPO | Admitting: *Deleted

## 2014-11-27 DIAGNOSIS — Z954 Presence of other heart-valve replacement: Secondary | ICD-10-CM | POA: Diagnosis not present

## 2014-11-27 DIAGNOSIS — I059 Rheumatic mitral valve disease, unspecified: Secondary | ICD-10-CM | POA: Diagnosis not present

## 2014-11-27 DIAGNOSIS — Z7901 Long term (current) use of anticoagulants: Secondary | ICD-10-CM

## 2014-11-27 DIAGNOSIS — Z952 Presence of prosthetic heart valve: Secondary | ICD-10-CM

## 2014-11-27 LAB — POCT INR: INR: 2.5

## 2014-12-30 ENCOUNTER — Ambulatory Visit (INDEPENDENT_AMBULATORY_CARE_PROVIDER_SITE_OTHER): Payer: BC Managed Care – PPO | Admitting: *Deleted

## 2014-12-30 DIAGNOSIS — Z954 Presence of other heart-valve replacement: Secondary | ICD-10-CM | POA: Diagnosis not present

## 2014-12-30 DIAGNOSIS — Z952 Presence of prosthetic heart valve: Secondary | ICD-10-CM

## 2014-12-30 DIAGNOSIS — Z7901 Long term (current) use of anticoagulants: Secondary | ICD-10-CM | POA: Diagnosis not present

## 2014-12-30 DIAGNOSIS — I059 Rheumatic mitral valve disease, unspecified: Secondary | ICD-10-CM

## 2014-12-30 LAB — POCT INR: INR: 2

## 2015-01-06 ENCOUNTER — Other Ambulatory Visit: Payer: Self-pay

## 2015-01-13 ENCOUNTER — Ambulatory Visit (INDEPENDENT_AMBULATORY_CARE_PROVIDER_SITE_OTHER): Payer: BC Managed Care – PPO | Admitting: *Deleted

## 2015-01-13 DIAGNOSIS — I059 Rheumatic mitral valve disease, unspecified: Secondary | ICD-10-CM

## 2015-01-13 DIAGNOSIS — Z954 Presence of other heart-valve replacement: Secondary | ICD-10-CM | POA: Diagnosis not present

## 2015-01-13 DIAGNOSIS — Z952 Presence of prosthetic heart valve: Secondary | ICD-10-CM

## 2015-01-13 DIAGNOSIS — Z7901 Long term (current) use of anticoagulants: Secondary | ICD-10-CM | POA: Diagnosis not present

## 2015-01-13 LAB — POCT INR: INR: 2.9

## 2015-02-17 ENCOUNTER — Ambulatory Visit (INDEPENDENT_AMBULATORY_CARE_PROVIDER_SITE_OTHER): Payer: BC Managed Care – PPO | Admitting: *Deleted

## 2015-02-17 DIAGNOSIS — Z7901 Long term (current) use of anticoagulants: Secondary | ICD-10-CM | POA: Diagnosis not present

## 2015-02-17 DIAGNOSIS — Z952 Presence of prosthetic heart valve: Secondary | ICD-10-CM

## 2015-02-17 DIAGNOSIS — I059 Rheumatic mitral valve disease, unspecified: Secondary | ICD-10-CM | POA: Diagnosis not present

## 2015-02-17 DIAGNOSIS — Z954 Presence of other heart-valve replacement: Secondary | ICD-10-CM

## 2015-02-17 LAB — POCT INR: INR: 3.8

## 2015-02-25 ENCOUNTER — Other Ambulatory Visit: Payer: Self-pay | Admitting: *Deleted

## 2015-02-25 MED ORDER — WARFARIN SODIUM 5 MG PO TABS: 5.0000 mg | ORAL_TABLET | ORAL | Status: DC

## 2015-02-26 ENCOUNTER — Other Ambulatory Visit: Payer: Self-pay | Admitting: *Deleted

## 2015-02-26 MED ORDER — WARFARIN SODIUM 5 MG PO TABS: ORAL_TABLET | ORAL | Status: DC

## 2015-03-04 ENCOUNTER — Telehealth: Payer: Self-pay

## 2015-03-04 NOTE — Telephone Encounter (Signed)
He was calling to confirm his appt. With Dr. Eden EmmsNishan.

## 2015-03-17 ENCOUNTER — Ambulatory Visit (INDEPENDENT_AMBULATORY_CARE_PROVIDER_SITE_OTHER): Payer: BC Managed Care – PPO | Admitting: *Deleted

## 2015-03-17 DIAGNOSIS — I059 Rheumatic mitral valve disease, unspecified: Secondary | ICD-10-CM

## 2015-03-17 DIAGNOSIS — Z7901 Long term (current) use of anticoagulants: Secondary | ICD-10-CM | POA: Diagnosis not present

## 2015-03-17 DIAGNOSIS — Z954 Presence of other heart-valve replacement: Secondary | ICD-10-CM

## 2015-03-17 DIAGNOSIS — Z952 Presence of prosthetic heart valve: Secondary | ICD-10-CM

## 2015-03-17 LAB — POCT INR: INR: 2.4

## 2015-04-09 ENCOUNTER — Ambulatory Visit (INDEPENDENT_AMBULATORY_CARE_PROVIDER_SITE_OTHER): Payer: BC Managed Care – PPO | Admitting: Pharmacist

## 2015-04-09 DIAGNOSIS — I059 Rheumatic mitral valve disease, unspecified: Secondary | ICD-10-CM | POA: Diagnosis not present

## 2015-04-09 DIAGNOSIS — Z7901 Long term (current) use of anticoagulants: Secondary | ICD-10-CM

## 2015-04-09 DIAGNOSIS — Z952 Presence of prosthetic heart valve: Secondary | ICD-10-CM

## 2015-04-09 DIAGNOSIS — Z954 Presence of other heart-valve replacement: Secondary | ICD-10-CM | POA: Diagnosis not present

## 2015-04-09 LAB — POCT INR: INR: 2.4

## 2015-04-13 ENCOUNTER — Other Ambulatory Visit (INDEPENDENT_AMBULATORY_CARE_PROVIDER_SITE_OTHER): Payer: Self-pay | Admitting: Internal Medicine

## 2015-04-23 NOTE — Progress Notes (Signed)
Patient ID: Luis Haynes Study, PhD, male   DOB: 06/15/1950, 65 y.o.   MRN: [id number]   Luis Haynes is seen today in followup for his hypertension and mitral  valve replacement. Last echo 2013 normal MVR function  He is due to have his Coumadin checked today. He has been therapeutic in general. There's been no bleeding diathesis. He's been following his SBE prophylaxis. He's been compliant with his blood pressure medication. Otherwise he's not had any significant chest pain PND or orthopnea. Has been no TIA or CVA. Has been no signs of heart failure Has ? venous insuficiency with pain in LE;s that has been markedly improved with support hose INR;s have been Rx and following in coumadin clinic regularlry  Echo 2013   Mitral valve: A mechanical prosthesis was present. Doppler: Transvalvular velocity was within the normal range. There was no evidence for stenosis. No regurgitation. Valve area by pressure half-time: 2.06cm^2. Indexed valve area by pressure half-time: 0.96cm^2/m^2. Valve area by continuity equation (using LVOT flow): 2.55cm^2. Indexed valve area by continuity equation (using LVOT flow): 1.19cm^2/m^2. Mean gradient: 4mm Hg (D). Peak gradient: 7mm Hg (D).  No complaints  Sees dentist twice a year and no issues INR;s Rx with checks every 4-6 weeks [place]  Had colonoscopy with lovenox bridge with Dr Karilyn Cotaehman Erosive esophagitiis and pan colonic diverticulosis  ROS: Denies fever, malais, weight loss, blurry vision, decreased visual acuity, cough, sputum, SOB, hemoptysis, pleuritic pain, palpitaitons, heartburn, abdominal pain, melena, lower extremity edema, claudication, or rash.  All other systems reviewed and negative  General: Affect appropriate Healthy:  appears stated age HEENT: normal Neck supple with no adenopathy JVP normal no bruits no thyromegaly Lungs clear with no wheezing and good diaphragmatic motion Heart:  S1 click /S2 midl MR  murmur, no rub, gallop or  click PMI normal Abdomen: benighn, BS positve, no tenderness, no AAA no bruit.  No HSM or HJR Distal pulses intact with no bruits No edema Neuro non-focal Skin warm and dry No muscular weakness   Current Outpatient Prescriptions  Medication Sig Dispense Refill  . Fish Oil-Cholecalciferol (FISH OIL + D3 PO) Take by mouth.    Marland Kitchen. lisinopril (PRINIVIL,ZESTRIL) 40 MG tablet Take 40 mg by mouth daily.    . nebivolol (BYSTOLIC) 5 MG tablet Take 5 mg by mouth daily.    . pantoprazole (PROTONIX) 40 MG tablet take 1 tablet by mouth every morning 30 tablet 5  . Probiotic Product (RESTORA PO) Take by mouth.    . warfarin (COUMADIN) 5 MG tablet Take 5mg  daily except 2.5mg  on Wednesdays 45 tablet 3   No current facility-administered medications for this visit.    Allergies  Amoxicillin; Erythromycin; and Sulfonamide derivatives  Electrocardiogram:  NSR rate 73 RBBB LAD LAE possible old inf/ant MI's   Assessment and Plan  RBBB: Chronic no high grade heart block yearly ECG GERD low carb diet protonix f/u GI previous EGD with some inflamation Anticoagulation Rx has some greens weekly but stable number  No bleeding issues MVR  F/u echo been over 3 years normal valve sounds SBE Sees dentist q 6 months  F/u with me in a year  [employer]Peter Essie Haynes

## 2015-04-24 ENCOUNTER — Ambulatory Visit (INDEPENDENT_AMBULATORY_CARE_PROVIDER_SITE_OTHER): Payer: BC Managed Care – PPO | Admitting: *Deleted

## 2015-04-24 ENCOUNTER — Encounter: Payer: Self-pay | Admitting: Cardiovascular Disease

## 2015-04-24 ENCOUNTER — Ambulatory Visit (INDEPENDENT_AMBULATORY_CARE_PROVIDER_SITE_OTHER): Payer: BC Managed Care – PPO | Admitting: Cardiovascular Disease

## 2015-04-24 VITALS — BP 142/88 | HR 70 | Ht 73.0 in | Wt 203.0 lb

## 2015-04-24 DIAGNOSIS — Z952 Presence of prosthetic heart valve: Secondary | ICD-10-CM

## 2015-04-24 DIAGNOSIS — Z7901 Long term (current) use of anticoagulants: Secondary | ICD-10-CM | POA: Diagnosis not present

## 2015-04-24 DIAGNOSIS — Z954 Presence of other heart-valve replacement: Secondary | ICD-10-CM

## 2015-04-24 DIAGNOSIS — I059 Rheumatic mitral valve disease, unspecified: Secondary | ICD-10-CM

## 2015-04-24 LAB — POCT INR: INR: 3.8

## 2015-04-24 NOTE — Patient Instructions (Signed)
Your physician wants you to follow-up in: 1 Year with Dr. Eden EmmsNishan,  You will receive a reminder letter in the mail two months in advance. If you don't receive a letter, please call our office to schedule the follow-up appointment.  Your physician has requested that you have an echocardiogram. Echocardiography is a painless test that uses sound waves to create images of your heart. It provides your doctor with information about the size and shape of your heart and how well your heart's chambers and valves are working. This procedure takes approximately one hour. There are no restrictions for this procedure.   Your physician recommends that you continue on your current medications as directed. Please refer to the Current Medication list given to you today.  If you need a refill on your cardiac medications before your next appointment, please call your pharmacy.  Thank you for choosing [facility]!

## 2015-05-02 ENCOUNTER — Ambulatory Visit (HOSPITAL_COMMUNITY)
Admission: RE | Admit: 2015-05-02 | Discharge: 2015-05-02 | Disposition: A | Discharge status: Home / Self Care | Payer: BC Managed Care – PPO | Source: Ambulatory Visit | Attending: Cardiovascular Disease | Admitting: Cardiovascular Disease

## 2015-05-02 DIAGNOSIS — I10 Essential (primary) hypertension: Secondary | ICD-10-CM | POA: Diagnosis not present

## 2015-05-02 DIAGNOSIS — E785 Hyperlipidemia, unspecified: Secondary | ICD-10-CM | POA: Diagnosis not present

## 2015-05-02 DIAGNOSIS — Z954 Presence of other heart-valve replacement: Secondary | ICD-10-CM

## 2015-05-02 DIAGNOSIS — I059 Rheumatic mitral valve disease, unspecified: Secondary | ICD-10-CM | POA: Insufficient documentation

## 2015-05-02 DIAGNOSIS — Z952 Presence of prosthetic heart valve: Secondary | ICD-10-CM

## 2015-05-05 ENCOUNTER — Encounter (INDEPENDENT_AMBULATORY_CARE_PROVIDER_SITE_OTHER): Payer: Self-pay | Admitting: Internal Medicine

## 2015-06-02 ENCOUNTER — Ambulatory Visit (INDEPENDENT_AMBULATORY_CARE_PROVIDER_SITE_OTHER): Payer: BC Managed Care – PPO | Admitting: *Deleted

## 2015-06-02 DIAGNOSIS — Z954 Presence of other heart-valve replacement: Secondary | ICD-10-CM

## 2015-06-02 DIAGNOSIS — I059 Rheumatic mitral valve disease, unspecified: Secondary | ICD-10-CM | POA: Diagnosis not present

## 2015-06-02 DIAGNOSIS — Z7901 Long term (current) use of anticoagulants: Secondary | ICD-10-CM

## 2015-06-02 DIAGNOSIS — Z952 Presence of prosthetic heart valve: Secondary | ICD-10-CM

## 2015-06-02 LAB — POCT INR: INR: 4.3

## 2015-06-04 ENCOUNTER — Ambulatory Visit (INDEPENDENT_AMBULATORY_CARE_PROVIDER_SITE_OTHER): Payer: BC Managed Care – PPO | Admitting: Internal Medicine

## 2015-06-14 ENCOUNTER — Other Ambulatory Visit (HOSPITAL_COMMUNITY): Payer: Self-pay | Admitting: Internal Medicine

## 2015-06-14 ENCOUNTER — Ambulatory Visit (HOSPITAL_COMMUNITY)
Admission: RE | Admit: 2015-06-14 | Discharge: 2015-06-14 | Disposition: A | Discharge status: Home / Self Care | Payer: BC Managed Care – PPO | Source: Ambulatory Visit | Attending: Internal Medicine | Admitting: Internal Medicine

## 2015-06-14 DIAGNOSIS — R0602 Shortness of breath: Secondary | ICD-10-CM | POA: Diagnosis not present

## 2015-06-14 DIAGNOSIS — I517 Cardiomegaly: Secondary | ICD-10-CM | POA: Diagnosis not present

## 2015-06-30 ENCOUNTER — Ambulatory Visit (INDEPENDENT_AMBULATORY_CARE_PROVIDER_SITE_OTHER): Payer: BC Managed Care – PPO | Admitting: *Deleted

## 2015-06-30 ENCOUNTER — Other Ambulatory Visit: Payer: Self-pay | Admitting: Cardiovascular Disease

## 2015-06-30 DIAGNOSIS — I059 Rheumatic mitral valve disease, unspecified: Secondary | ICD-10-CM | POA: Diagnosis not present

## 2015-06-30 DIAGNOSIS — Z7901 Long term (current) use of anticoagulants: Secondary | ICD-10-CM

## 2015-06-30 DIAGNOSIS — Z954 Presence of other heart-valve replacement: Secondary | ICD-10-CM

## 2015-06-30 DIAGNOSIS — Z952 Presence of prosthetic heart valve: Secondary | ICD-10-CM

## 2015-06-30 LAB — POCT INR: INR: 4.9

## 2015-07-04 ENCOUNTER — Ambulatory Visit (INDEPENDENT_AMBULATORY_CARE_PROVIDER_SITE_OTHER): Payer: BC Managed Care – PPO | Admitting: Internal Medicine

## 2015-07-07 ENCOUNTER — Ambulatory Visit (INDEPENDENT_AMBULATORY_CARE_PROVIDER_SITE_OTHER): Payer: BC Managed Care – PPO | Admitting: Internal Medicine

## 2015-07-09 ENCOUNTER — Ambulatory Visit (INDEPENDENT_AMBULATORY_CARE_PROVIDER_SITE_OTHER): Payer: BC Managed Care – PPO | Admitting: *Deleted

## 2015-07-09 DIAGNOSIS — Z7901 Long term (current) use of anticoagulants: Secondary | ICD-10-CM | POA: Diagnosis not present

## 2015-07-09 DIAGNOSIS — Z954 Presence of other heart-valve replacement: Secondary | ICD-10-CM

## 2015-07-09 DIAGNOSIS — I059 Rheumatic mitral valve disease, unspecified: Secondary | ICD-10-CM | POA: Diagnosis not present

## 2015-07-09 DIAGNOSIS — Z952 Presence of prosthetic heart valve: Secondary | ICD-10-CM

## 2015-07-09 LAB — POCT INR: INR: 2.1

## 2015-07-14 ENCOUNTER — Encounter (INDEPENDENT_AMBULATORY_CARE_PROVIDER_SITE_OTHER): Payer: Self-pay | Admitting: Internal Medicine

## 2015-07-14 ENCOUNTER — Ambulatory Visit (INDEPENDENT_AMBULATORY_CARE_PROVIDER_SITE_OTHER): Payer: BC Managed Care – PPO | Admitting: Internal Medicine

## 2015-07-14 VITALS — BP 122/84 | HR 64 | Temp 98.0°F | Ht 73.0 in | Wt 194.3 lb

## 2015-07-14 DIAGNOSIS — K219 Gastro-esophageal reflux disease without esophagitis: Secondary | ICD-10-CM

## 2015-07-14 NOTE — Patient Instructions (Signed)
OV in 1 year.  

## 2015-07-14 NOTE — Progress Notes (Addendum)
Subjective:    Patient ID: Luis Natalobert Tarbell, PhD, male    DOB: 07/07/1950, 65 y.o.   MRN: [id number]  HPI Here today for f/u of his GERD. He was last seen in November,  2015. Weight then 200.  He tells me his GERD is controlled with Protonix. He says it has been a good year. He has lost about 5 pounds which was intentional.  Eats a lot of vegetables.  He does eat spicy foods which has not been a problem with him. Appetite is good.  Usually has a BM at least once a day. No melena or BRRB. No NSAIDs.  05/02/2015 Echo: EF 55-60 No family hx of colon cancer. Hx of Mitral valve replacement and on chronic Coumadin therapy.   03/29/2014   EGD & Colonoscopy  Indications:  Patient is 65 year old Caucasian male who is chronically anticoagulated because he has a mechanical mitral valve. He experienced rectal bleeding lasting 3 days but one month ago followed by black stools. When the symptoms occurred he had taken a few doses of Advil. His H&H was normal. He denies nausea vomiting abdominal pain. Last EGD and colonoscopy were 8 or 9 years ago while he was living in [place].           Impression:   EGD findings; Erosive reflux esophagitis. Moderate-sized sliding hiatal hernia. Erosive gastritis and pyloric channel inflammation.  Colonoscopy findings; Examination performed to cecum. Pancolonic diverticulosis. Most of the diverticula are located at sigmoid colon. Internal hemorrhoids.  Comment; He may have bled from right-sided diverticula.                                                                                                                   Review of Systems   Past Medical History  Diagnosis Date  . H/O mitral valve replacement     10 yrs ago  . Hypertension   . HYPERLIPIDEMIA   . MITRAL VALVE PROLAPSE   . PREMATURE VENTRICULAR CONTRACTIONS   . ALLERGIC RHINITIS   . Long term current use of anticoagulant     Past Surgical History  Procedure Laterality Date  .  Shoulder surgery    . Mitral valve replacement    . Colonoscopy N/A 03/29/2014    Procedure: COLONOSCOPY;  Surgeon: Malissa HippoNajeeb U Rehman, MD;  Location: [facility];  Service: Endoscopy;  Laterality: N/A;  855  . Esophagogastroduodenoscopy N/A 03/29/2014    Procedure: ESOPHAGOGASTRODUODENOSCOPY (EGD);  Surgeon: Malissa HippoNajeeb U Rehman, MD;  Location: [facility];  Service: Endoscopy;  Laterality: N/A;    Allergies  Allergen Reactions  . Amoxicillin     GI upset  . Erythromycin   . Sulfonamide Derivatives     Current Outpatient Prescriptions on File Prior to Visit  Medication Sig Dispense Refill  . Fish Oil-Cholecalciferol (FISH OIL + D3 PO) Take 1,000 mg by mouth.     Marland Kitchen. lisinopril (PRINIVIL,ZESTRIL) 40 MG tablet Take 40 mg by mouth daily. Reported on 07/14/2015    . nebivolol (BYSTOLIC) 5  MG tablet Take 5 mg by mouth daily.    . pantoprazole (PROTONIX) 40 MG tablet take 1 tablet by mouth every morning 30 tablet 5  . warfarin (COUMADIN) 5 MG tablet Take 1 tablet (5 mg total) by mouth daily. 30 tablet 3   No current facility-administered medications on file prior to visit.        Objective:   Physical Exam Blood pressure 122/84, pulse 64, temperature 98 F (36.7 C), height  (1.854 m), weight 194 lb 4.8 oz (88.134 kg). Alert and oriented. Skin warm and dry. Oral mucosa is moist.   . Sclera anicteric, conjunctivae is pink. Thyroid not enlarged. No cervical lymphadenopathy. Lungs clear. Heart regular rate and rhythm.  Abdomen is soft. Bowel sounds are positive. No hepatomegaly. No abdominal masses felt. No tenderness.  No edema to lower extremities.         Assessment & Plan:  GERD. Controlled  At this time.  OV in 1 year.

## 2015-07-30 ENCOUNTER — Ambulatory Visit (INDEPENDENT_AMBULATORY_CARE_PROVIDER_SITE_OTHER): Payer: BC Managed Care – PPO | Admitting: *Deleted

## 2015-07-30 DIAGNOSIS — I059 Rheumatic mitral valve disease, unspecified: Secondary | ICD-10-CM | POA: Diagnosis not present

## 2015-07-30 DIAGNOSIS — Z954 Presence of other heart-valve replacement: Secondary | ICD-10-CM | POA: Diagnosis not present

## 2015-07-30 DIAGNOSIS — Z952 Presence of prosthetic heart valve: Secondary | ICD-10-CM

## 2015-07-30 DIAGNOSIS — Z7901 Long term (current) use of anticoagulants: Secondary | ICD-10-CM

## 2015-08-18 ENCOUNTER — Ambulatory Visit (INDEPENDENT_AMBULATORY_CARE_PROVIDER_SITE_OTHER): Payer: BC Managed Care – PPO | Admitting: *Deleted

## 2015-08-18 DIAGNOSIS — Z7901 Long term (current) use of anticoagulants: Secondary | ICD-10-CM | POA: Diagnosis not present

## 2015-08-18 DIAGNOSIS — Z954 Presence of other heart-valve replacement: Secondary | ICD-10-CM | POA: Diagnosis not present

## 2015-08-18 DIAGNOSIS — I059 Rheumatic mitral valve disease, unspecified: Secondary | ICD-10-CM

## 2015-08-18 DIAGNOSIS — Z952 Presence of prosthetic heart valve: Secondary | ICD-10-CM

## 2015-08-18 LAB — POCT INR: INR: 2.8

## 2015-09-22 ENCOUNTER — Ambulatory Visit (INDEPENDENT_AMBULATORY_CARE_PROVIDER_SITE_OTHER): Payer: BC Managed Care – PPO | Admitting: *Deleted

## 2015-09-22 DIAGNOSIS — Z952 Presence of prosthetic heart valve: Secondary | ICD-10-CM

## 2015-09-22 DIAGNOSIS — Z7901 Long term (current) use of anticoagulants: Secondary | ICD-10-CM

## 2015-09-22 DIAGNOSIS — I059 Rheumatic mitral valve disease, unspecified: Secondary | ICD-10-CM | POA: Diagnosis not present

## 2015-09-22 DIAGNOSIS — Z954 Presence of other heart-valve replacement: Secondary | ICD-10-CM

## 2015-09-22 LAB — POCT INR: INR: 3.1

## 2015-10-09 ENCOUNTER — Other Ambulatory Visit (INDEPENDENT_AMBULATORY_CARE_PROVIDER_SITE_OTHER): Payer: Self-pay | Admitting: Internal Medicine

## 2015-10-20 ENCOUNTER — Ambulatory Visit (INDEPENDENT_AMBULATORY_CARE_PROVIDER_SITE_OTHER): Payer: BC Managed Care – PPO | Admitting: *Deleted

## 2015-10-20 DIAGNOSIS — Z954 Presence of other heart-valve replacement: Secondary | ICD-10-CM | POA: Diagnosis not present

## 2015-10-20 DIAGNOSIS — Z7901 Long term (current) use of anticoagulants: Secondary | ICD-10-CM | POA: Diagnosis not present

## 2015-10-20 DIAGNOSIS — I059 Rheumatic mitral valve disease, unspecified: Secondary | ICD-10-CM | POA: Diagnosis not present

## 2015-10-20 DIAGNOSIS — Z952 Presence of prosthetic heart valve: Secondary | ICD-10-CM

## 2015-10-20 LAB — POCT INR: INR: 2.9

## 2015-12-01 ENCOUNTER — Telehealth: Payer: Self-pay | Admitting: *Deleted

## 2015-12-01 NOTE — Telephone Encounter (Signed)
[facility] requesting to speak with Vashti HeyLisa Reid in regards to hs coumdin appointment for today. States that he was called into work early and that he would call back to reschedule.

## 2015-12-01 NOTE — Telephone Encounter (Signed)
Appt rescheduled for 12/08/15

## 2015-12-08 ENCOUNTER — Ambulatory Visit (INDEPENDENT_AMBULATORY_CARE_PROVIDER_SITE_OTHER): Payer: BC Managed Care – PPO | Admitting: *Deleted

## 2015-12-08 DIAGNOSIS — I059 Rheumatic mitral valve disease, unspecified: Secondary | ICD-10-CM | POA: Diagnosis not present

## 2015-12-08 DIAGNOSIS — Z7901 Long term (current) use of anticoagulants: Secondary | ICD-10-CM

## 2015-12-08 DIAGNOSIS — Z954 Presence of other heart-valve replacement: Secondary | ICD-10-CM

## 2015-12-08 DIAGNOSIS — Z952 Presence of prosthetic heart valve: Secondary | ICD-10-CM

## 2015-12-08 LAB — POCT INR: INR: 2.4

## 2015-12-24 ENCOUNTER — Other Ambulatory Visit: Payer: Self-pay | Admitting: Cardiovascular Disease

## 2015-12-24 ENCOUNTER — Encounter (INDEPENDENT_AMBULATORY_CARE_PROVIDER_SITE_OTHER): Payer: Self-pay | Admitting: Internal Medicine

## 2015-12-24 ENCOUNTER — Ambulatory Visit (INDEPENDENT_AMBULATORY_CARE_PROVIDER_SITE_OTHER): Payer: BC Managed Care – PPO | Admitting: Internal Medicine

## 2015-12-24 VITALS — BP 122/80 | HR 72 | Temp 98.3°F | Ht 73.0 in | Wt 204.3 lb

## 2015-12-24 DIAGNOSIS — K625 Hemorrhage of anus and rectum: Secondary | ICD-10-CM | POA: Diagnosis not present

## 2015-12-24 LAB — CBC WITH DIFFERENTIAL/PLATELET
BASOS PCT: 1 %
Basophils Absolute: 75 cells/uL (ref 0–200)
EOS ABS: 225 {cells}/uL (ref 15–500)
Eosinophils Relative: 3 %
HEMATOCRIT: 45.7 % (ref 38.5–50.0)
Hemoglobin: 15.1 g/dL (ref 13.2–17.1)
LYMPHS PCT: 33 %
Lymphs Abs: 2475 cells/uL (ref 850–3900)
MCH: 29.2 pg (ref 27.0–33.0)
MCHC: 33 g/dL (ref 32.0–36.0)
MCV: 88.2 fL (ref 80.0–100.0)
MONO ABS: 825 {cells}/uL (ref 200–950)
MPV: 10.3 fL (ref 7.5–12.5)
Monocytes Relative: 11 %
NEUTROS PCT: 52 %
Neutro Abs: 3900 cells/uL (ref 1500–7800)
Platelets: 267 10*3/uL (ref 140–400)
RBC: 5.18 MIL/uL (ref 4.20–5.80)
RDW: 15 % (ref 11.0–15.0)
WBC: 7.5 10*3/uL (ref 3.8–10.8)

## 2015-12-24 NOTE — Patient Instructions (Signed)
CBC, Stool cards Stool softener.

## 2015-12-24 NOTE — Progress Notes (Signed)
Subjective:    Patient ID: Luis Natalobert Holway, PhD, male    DOB: 07/28/1950, 65 y.o.   MRN: [id number]  HPIPresents today with c/o that he noticed a small amt of light blood on the tissue paper.  It occurred x 2 one day. Has not seen any blood since.   His stools were firm that day he saw blood. No change in his stools. Last colonoscopy/EGD in 2015 for rectal bleeding and melena. No melena.  No NSAIDs.  CBC     03/29/2014 EGD & Colonoscopy  Indications: Patient is 65 year old Caucasian male who is chronically anticoagulated because he has a mechanical mitral valve. He experienced rectal bleeding lasting 3 days but one month ago followed by black stools. When the symptoms occurred he had taken a few doses of Advil. His H&H was normal. He denies nausea vomiting abdominal pain. Last EGD and colonoscopy were 8 or 9 years ago while he was living in [place].  Impression:  EGD findings; Erosive reflux esophagitis. Moderate-sized sliding hiatal hernia. Erosive gastritis and pyloric channel inflammation.   Colonoscopy findings; Examination performed to cecum. Pancolonic diverticulosis. Most of the diverticula are located at sigmoid colon. Internal hemorrhoids.   Review of Systems Past Medical History:  Diagnosis Date  . ALLERGIC RHINITIS   . H/O mitral valve replacement    10 yrs ago  . HYPERLIPIDEMIA   . Hypertension   . Long term current use of anticoagulant   . MITRAL VALVE PROLAPSE   . PREMATURE VENTRICULAR CONTRACTIONS     Past Surgical History:  Procedure Laterality Date  . COLONOSCOPY N/A 03/29/2014   Procedure: COLONOSCOPY;  Surgeon: Malissa HippoNajeeb U Rehman, MD;  [facility]: [facility];  Service: Endoscopy;  Laterality: N/A;  855  . ESOPHAGOGASTRODUODENOSCOPY N/A 03/29/2014   Procedure: ESOPHAGOGASTRODUODENOSCOPY (EGD);  Surgeon: Malissa HippoNajeeb U Rehman, MD;  [facility]: [facility];  Service: Endoscopy;  Laterality: N/A;  . MITRAL VALVE REPLACEMENT    . SHOULDER  SURGERY      Allergies  Allergen Reactions  . Amoxicillin     GI upset  . Erythromycin   . Sulfonamide Derivatives     Current Outpatient Prescriptions on File Prior to Visit  Medication Sig Dispense Refill  . ezetimibe-simvastatin (VYTORIN) 10-20 MG tablet Take 1 tablet by mouth daily.    Marland Kitchen. Fish Oil-Cholecalciferol (FISH OIL + D3 PO) Take 1,000 mg by mouth.     Marland Kitchen. lisinopril (PRINIVIL,ZESTRIL) 40 MG tablet Take 40 mg by mouth daily. Reported on 07/14/2015    . montelukast (SINGULAIR) 10 MG tablet Take 10 mg by mouth at bedtime.    . nebivolol (BYSTOLIC) 5 MG tablet Take 5 mg by mouth daily.    . pantoprazole (PROTONIX) 40 MG tablet take 1 tablet by mouth every morning 30 tablet 5   No current facility-administered medications on file prior to visit.        Objective:   Physical Exam Blood pressure 122/80, pulse 72, temperature 98.3 F (36.8 C), height 6\' 1"  (1.854 m), weight 204 lb 4.8 oz (92.7 kg). Alert and oriented. Skin warm and dry. Oral mucosa is moist.   . Sclera anicteric, conjunctivae is pink. Thyroid not enlarged. No cervical lymphadenopathy. Lungs clear. Heart regular rate and rhythm.  Abdomen is soft. Bowel sounds are positive. No hepatomegaly. No abdominal masses felt. No tenderness.  No edema to lower extremities.         Assessment & Plan:  Rectal bleeding. Possible hemorrhoidal.   Stool softener. CBC today. Stool  cards x 3 sent home with patient. Further recommendations to follow.

## 2015-12-31 ENCOUNTER — Telehealth (INDEPENDENT_AMBULATORY_CARE_PROVIDER_SITE_OTHER): Payer: Self-pay | Admitting: *Deleted

## 2015-12-31 NOTE — Telephone Encounter (Signed)
Results left on answering machine 

## 2015-12-31 NOTE — Telephone Encounter (Signed)
   Diagnosis:    Result(s)   Card 1:Negative:     Card 2 Negative:   Card 3:Negative:    Completed by: Larose Hiresammy Lucita Montoya ,LPN   HEMOCCULT SENSA DEVELOPER: XBJ#:[id number]#:[id number]   EXPIRATION DATE: 2020/05   HEMOCCULT SENSA CARD:  AOZ#:[id number]#:[id number]   EXPIRATION DATE: 03/20   CARD CONTROL RESULTS:  POSITIVE: Positive  NEGATIVE: Negative    ADDITIONAL COMMENTS: Forwarded to Delrae Renderri Setzer,NP

## 2016-01-05 ENCOUNTER — Ambulatory Visit (INDEPENDENT_AMBULATORY_CARE_PROVIDER_SITE_OTHER): Payer: BC Managed Care – PPO | Admitting: *Deleted

## 2016-01-05 DIAGNOSIS — I059 Rheumatic mitral valve disease, unspecified: Secondary | ICD-10-CM

## 2016-01-05 DIAGNOSIS — Z954 Presence of other heart-valve replacement: Secondary | ICD-10-CM

## 2016-01-05 DIAGNOSIS — Z952 Presence of prosthetic heart valve: Secondary | ICD-10-CM

## 2016-01-05 DIAGNOSIS — Z7901 Long term (current) use of anticoagulants: Secondary | ICD-10-CM | POA: Diagnosis not present

## 2016-01-05 LAB — POCT INR: INR: 3.1

## 2016-01-06 ENCOUNTER — Encounter (INDEPENDENT_AMBULATORY_CARE_PROVIDER_SITE_OTHER): Payer: Self-pay | Admitting: Internal Medicine

## 2016-02-16 ENCOUNTER — Ambulatory Visit (INDEPENDENT_AMBULATORY_CARE_PROVIDER_SITE_OTHER): Payer: BC Managed Care – PPO | Admitting: *Deleted

## 2016-02-16 DIAGNOSIS — Z952 Presence of prosthetic heart valve: Secondary | ICD-10-CM

## 2016-02-16 DIAGNOSIS — Z7901 Long term (current) use of anticoagulants: Secondary | ICD-10-CM | POA: Diagnosis not present

## 2016-02-16 DIAGNOSIS — I059 Rheumatic mitral valve disease, unspecified: Secondary | ICD-10-CM

## 2016-02-16 LAB — POCT INR: INR: 2.8

## 2016-03-22 ENCOUNTER — Ambulatory Visit (INDEPENDENT_AMBULATORY_CARE_PROVIDER_SITE_OTHER): Payer: BC Managed Care – PPO | Admitting: *Deleted

## 2016-03-22 DIAGNOSIS — Z7901 Long term (current) use of anticoagulants: Secondary | ICD-10-CM

## 2016-03-22 DIAGNOSIS — I059 Rheumatic mitral valve disease, unspecified: Secondary | ICD-10-CM | POA: Diagnosis not present

## 2016-03-22 DIAGNOSIS — Z952 Presence of prosthetic heart valve: Secondary | ICD-10-CM

## 2016-03-22 LAB — POCT INR: INR: 2.5

## 2016-04-06 ENCOUNTER — Other Ambulatory Visit (INDEPENDENT_AMBULATORY_CARE_PROVIDER_SITE_OTHER): Payer: Self-pay | Admitting: Internal Medicine

## 2016-04-22 ENCOUNTER — Other Ambulatory Visit: Payer: Self-pay | Admitting: Cardiovascular Disease

## 2016-04-26 ENCOUNTER — Ambulatory Visit (INDEPENDENT_AMBULATORY_CARE_PROVIDER_SITE_OTHER): Payer: BC Managed Care – PPO | Admitting: *Deleted

## 2016-04-26 DIAGNOSIS — I059 Rheumatic mitral valve disease, unspecified: Secondary | ICD-10-CM

## 2016-04-26 DIAGNOSIS — Z952 Presence of prosthetic heart valve: Secondary | ICD-10-CM

## 2016-04-26 DIAGNOSIS — Z7901 Long term (current) use of anticoagulants: Secondary | ICD-10-CM | POA: Diagnosis not present

## 2016-04-26 LAB — POCT INR: INR: 4.1

## 2016-04-28 ENCOUNTER — Encounter (INDEPENDENT_AMBULATORY_CARE_PROVIDER_SITE_OTHER): Payer: Self-pay

## 2016-04-28 ENCOUNTER — Encounter (INDEPENDENT_AMBULATORY_CARE_PROVIDER_SITE_OTHER): Payer: Self-pay | Admitting: Internal Medicine

## 2016-05-24 ENCOUNTER — Ambulatory Visit (INDEPENDENT_AMBULATORY_CARE_PROVIDER_SITE_OTHER): Payer: BC Managed Care – PPO | Admitting: *Deleted

## 2016-05-24 DIAGNOSIS — Z952 Presence of prosthetic heart valve: Secondary | ICD-10-CM | POA: Diagnosis not present

## 2016-05-24 DIAGNOSIS — Z7901 Long term (current) use of anticoagulants: Secondary | ICD-10-CM | POA: Diagnosis not present

## 2016-05-24 DIAGNOSIS — I059 Rheumatic mitral valve disease, unspecified: Secondary | ICD-10-CM | POA: Diagnosis not present

## 2016-05-24 LAB — POCT INR: INR: 3.2

## 2016-06-21 ENCOUNTER — Ambulatory Visit (INDEPENDENT_AMBULATORY_CARE_PROVIDER_SITE_OTHER): Payer: BC Managed Care – PPO | Admitting: *Deleted

## 2016-06-21 DIAGNOSIS — Z952 Presence of prosthetic heart valve: Secondary | ICD-10-CM

## 2016-06-21 DIAGNOSIS — I059 Rheumatic mitral valve disease, unspecified: Secondary | ICD-10-CM | POA: Diagnosis not present

## 2016-06-21 DIAGNOSIS — Z7901 Long term (current) use of anticoagulants: Secondary | ICD-10-CM | POA: Diagnosis not present

## 2016-06-21 LAB — POCT INR: INR: 3.1

## 2016-07-13 ENCOUNTER — Ambulatory Visit (INDEPENDENT_AMBULATORY_CARE_PROVIDER_SITE_OTHER): Payer: BC Managed Care – PPO | Admitting: Internal Medicine

## 2016-07-26 ENCOUNTER — Ambulatory Visit (INDEPENDENT_AMBULATORY_CARE_PROVIDER_SITE_OTHER): Payer: BC Managed Care – PPO | Admitting: *Deleted

## 2016-07-26 DIAGNOSIS — Z7901 Long term (current) use of anticoagulants: Secondary | ICD-10-CM | POA: Diagnosis not present

## 2016-07-26 DIAGNOSIS — Z952 Presence of prosthetic heart valve: Secondary | ICD-10-CM

## 2016-07-26 DIAGNOSIS — I059 Rheumatic mitral valve disease, unspecified: Secondary | ICD-10-CM | POA: Diagnosis not present

## 2016-07-26 LAB — POCT INR: INR: 3.4

## 2016-08-12 ENCOUNTER — Ambulatory Visit (INDEPENDENT_AMBULATORY_CARE_PROVIDER_SITE_OTHER): Payer: BC Managed Care – PPO | Admitting: Internal Medicine

## 2016-08-12 ENCOUNTER — Encounter (INDEPENDENT_AMBULATORY_CARE_PROVIDER_SITE_OTHER): Payer: Self-pay | Admitting: Internal Medicine

## 2016-08-12 VITALS — BP 116/82 | HR 72 | Temp 98.2°F | Ht 73.0 in | Wt 204.7 lb

## 2016-08-12 DIAGNOSIS — K219 Gastro-esophageal reflux disease without esophagitis: Secondary | ICD-10-CM

## 2016-08-12 NOTE — Progress Notes (Signed)
Subjective:    Patient ID: Luis Natal, PhD, male    DOB: [birth year], 66 y.o.   MRN: [id number]  HPI Here today for f/u. Last seen in September of 2017 with c/o of rectal bleed.  Three stool cards sent home with patient and were negative. Last EGD/Colonoscopy was in 2015.  He tells me he had a stool with dark spots in it.  His appetite is good. No weight loss. He denies any abdominal pain. He remains active.  Continues to work full time.      Hx of mitral valve replacement   12/11/2015EGD &Colonoscopy  Indications:Patient is 66 year old Caucasian male who is chronically anticoagulated because he has a mechanical mitral valve. He experienced rectal bleeding lasting 3 days but one month ago followed by black stools. When the symptoms occurred he had taken a few doses of Advil. His H&H was normal. He denies nausea vomiting abdominal pain. Last EGD and colonoscopy were 8 or 9 years ago while he was living in [place].   Impression:  EGD findings; Erosive reflux esophagitis. Moderate-sized sliding hiatal hernia. Erosive gastritis and pyloric channel inflammation.  Colonoscopy findings; Examination performed to cecum. Pancolonic diverticulosis. Most of the diverticula are located at sigmoid colon. Internal hemorrhoids.     Review of Systems Past Medical History:  Diagnosis Date  . ALLERGIC RHINITIS   . H/O mitral valve replacement    10 yrs ago  . HYPERLIPIDEMIA   . Hypertension   . Long term current use of anticoagulant   . MITRAL VALVE PROLAPSE   . PREMATURE VENTRICULAR CONTRACTIONS     Past Surgical History:  Procedure Laterality Date  . COLONOSCOPY N/A 03/29/2014   Procedure: COLONOSCOPY;  Surgeon: Malissa Hippo, MD;  [facility];  Service: Endoscopy;  Laterality: N/A;  855  . ESOPHAGOGASTRODUODENOSCOPY N/A 03/29/2014   Procedure: ESOPHAGOGASTRODUODENOSCOPY (EGD);  Surgeon: Malissa Hippo, MD;  [facility];   Service: Endoscopy;  Laterality: N/A;  . MITRAL VALVE REPLACEMENT    . SHOULDER SURGERY      Allergies  Allergen Reactions  . Amoxicillin     GI upset  . Erythromycin   . Sulfonamide Derivatives     Current Outpatient Prescriptions on File Prior to Visit  Medication Sig Dispense Refill  . ezetimibe-simvastatin (VYTORIN) 10-20 MG tablet Take 1 tablet by mouth daily.    Marland Kitchen Fish Oil-Cholecalciferol (FISH OIL + D3 PO) Take 1,000 mg by mouth.     Marland Kitchen lisinopril (PRINIVIL,ZESTRIL) 40 MG tablet Take 40 mg by mouth daily. Reported on 07/14/2015    . montelukast (SINGULAIR) 10 MG tablet Take 10 mg by mouth at bedtime.    . nebivolol (BYSTOLIC) 5 MG tablet Take 5 mg by mouth daily.    . pantoprazole (PROTONIX) 40 MG tablet take 1 tablet by mouth every morning 30 tablet 5  . warfarin (COUMADIN) 5 MG tablet take 1 tablet by mouth once daily 30 tablet 3   No current facility-administered medications on file prior to visit.        Objective:   Physical Exam Blood pressure 116/82, pulse 72, temperature 98.2 F (36.8 C), height  (1.854 m), weight 204 lb 11.2 oz (92.9 kg). Alert and oriented. Skin warm and dry. Oral mucosa is moist.   . Sclera anicteric, conjunctivae is pink. Thyroid not enlarged. No cervical lymphadenopathy. Lungs clear. Heart regular rate and rhythm.  Abdomen is soft. Bowel sounds are positive. No hepatomegaly. No abdominal masses felt. No tenderness.  No edema to lower extremities.          Assessment & Plan:  GERD. Continue the Protonix. Will sent 3 stool cards home with patient.  OV in 1 year.

## 2016-08-12 NOTE — Patient Instructions (Signed)
OV in 1 year.  

## 2016-08-18 ENCOUNTER — Telehealth (INDEPENDENT_AMBULATORY_CARE_PROVIDER_SITE_OTHER): Payer: Self-pay | Admitting: *Deleted

## 2016-08-18 DIAGNOSIS — R195 Other fecal abnormalities: Secondary | ICD-10-CM | POA: Diagnosis not present

## 2016-08-18 NOTE — Telephone Encounter (Signed)
   Diagnosis:     Result(s)   Card 1:  Negative: 08/15/2016    Card 2: Negative:  08/16/2016   Card 3:  Negative:  08/17/2016    Completed by: Larose Hires ,LPN   HEMOCCULT SENSA DEVELOPER: ZOX#:[id number] Kerrie Pleasure DATE: 2020/05   HEMOCCULT SENSA CARD:  LOT#:[id number]   EXPIRATION DATE: 05/20   CARD CONTROL RESULTS:  POSITIVE: Positive  NEGATIVE: Negative    ADDITIONAL COMMENTS: Results preformed 08/17/2016 , results forwarded to Dorene Ar, NP. Patient has not been called.

## 2016-08-19 NOTE — Telephone Encounter (Signed)
Results left on cell phone

## 2016-08-20 ENCOUNTER — Other Ambulatory Visit: Payer: Self-pay | Admitting: Cardiovascular Disease

## 2016-09-01 ENCOUNTER — Ambulatory Visit (INDEPENDENT_AMBULATORY_CARE_PROVIDER_SITE_OTHER): Payer: BC Managed Care – PPO | Admitting: *Deleted

## 2016-09-01 DIAGNOSIS — Z952 Presence of prosthetic heart valve: Secondary | ICD-10-CM

## 2016-09-01 DIAGNOSIS — Z7901 Long term (current) use of anticoagulants: Secondary | ICD-10-CM | POA: Diagnosis not present

## 2016-09-01 DIAGNOSIS — I059 Rheumatic mitral valve disease, unspecified: Secondary | ICD-10-CM

## 2016-09-01 LAB — POCT INR: INR: 2.1

## 2016-09-22 ENCOUNTER — Ambulatory Visit (INDEPENDENT_AMBULATORY_CARE_PROVIDER_SITE_OTHER): Payer: BC Managed Care – PPO | Admitting: *Deleted

## 2016-09-22 DIAGNOSIS — I059 Rheumatic mitral valve disease, unspecified: Secondary | ICD-10-CM

## 2016-09-22 DIAGNOSIS — Z7901 Long term (current) use of anticoagulants: Secondary | ICD-10-CM | POA: Diagnosis not present

## 2016-09-22 DIAGNOSIS — Z952 Presence of prosthetic heart valve: Secondary | ICD-10-CM

## 2016-09-22 LAB — POCT INR: INR: 2.4

## 2016-10-04 NOTE — Progress Notes (Signed)
Patient ID: Luis Natalobert Pepperman, PhD, male   DOB: 08/30/1950, 66 y.o.   MRN: [id number]   Luis Haynes is seen today in followup for his hypertension and mitral  valve replacement.   He is due to have his Coumadin checked today. He has been therapeutic in general. There's been no bleeding diathesis. He's been following his SBE prophylaxis. He's been compliant with his blood pressure medication. Otherwise he's not had any significant chest pain PND or orthopnea. Has been no TIA or CVA. Has been no signs of heart failure Has ? venous insuficiency with pain in LE;s that has been markedly improved with support hose INR;s have been Rx and following in coumadin clinic regularlry  Echo  05/02/15  Study Conclusions  - Left ventricle: The cavity size was normal. Wall thickness was   increased in a pattern of severe LVH. Systolic function was   normal. The estimated ejection fraction was in the range of 55%   to 60%. Doppler parameters are consistent with abnormal left   ventricular relaxation (grade 1 diastolic dysfunction). Doppler   parameters are consistent with high ventricular filling pressure. - Regional wall motion abnormality: Possible hypokinesis of the mid   inferior and mid inferolateral myocardium. - Ventricular septum: Septal motion showed abnormal function and   dyssynergy. These changes are consistent with intraventricular   conduction delay. - Mitral valve: Normally functioning mechanical mitral prosthesis   noted. There was no significant regurgitation. Valve area by   pressure half-time: 1.93 cm^2. - Left atrium: The atrium was mildly dilated. - Tricuspid valve: There was mild regurgitation. - Pulmonary arteries: Systolic pressure was mildly increased. PA   peak pressure: 32 mm Hg (S).   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 Going [place] to [place] [employer] this  summer  ROS: Denies fever, malais, weight loss, blurry vision, decreased visual acuity, cough, sputum, SOB, hemoptysis, pleuritic pain, palpitaitons, heartburn, abdominal pain, melena, lower extremity edema, claudication, or rash.  All other systems reviewed and negative  General: Affect appropriate Healthy:  appears stated age HEENT: normal Neck supple with no adenopathy JVP normal no bruits no thyromegaly Lungs clear with no wheezing and good diaphragmatic motion Heart:  S1 click /S2 midl MR  murmur, no rub, gallop or click PMI normal Abdomen: benighn, BS positve, no tenderness, no AAA no bruit.  No HSM or HJR Distal pulses intact with no bruits No edema Neuro non-focal Skin warm and dry No muscular weakness   Current Outpatient Prescriptions  Medication Sig Dispense Refill  . ezetimibe-simvastatin (VYTORIN) 10-20 MG tablet Take 1 tablet by mouth daily.    Marland Kitchen. Fish Oil-Cholecalciferol (FISH OIL + D3 PO) Take 1,000 mg by mouth.     Marland Kitchen. lisinopril (PRINIVIL,ZESTRIL) 40 MG tablet Take 40 mg by mouth daily. Reported on 07/14/2015    . montelukast (SINGULAIR) 10 MG tablet Take 10 mg by mouth at bedtime.    . nebivolol (BYSTOLIC) 5 MG tablet Take 5 mg by mouth daily.    . pantoprazole (PROTONIX) 40 MG tablet take 1 tablet by mouth every morning 30 tablet 5  . warfarin (COUMADIN) 5 MG tablet take 1 tablet by mouth once daily 30 tablet 3   No current facility-administered medications for this visit.     Allergies  Amoxicillin; Erythromycin; and Sulfonamide derivatives  Electrocardiogram:  NSR rate 73 RBBB LAD LAE possible old inf/ant MI's 10/05/16  SR RBBB rate 71  Assessment and Plan  RBBB: Chronic no high grade heart block yearly ECG GERD low carb diet protonix f/u GI previous EGD with Rehman Anticoagulation Rx has some greens weekly but stable number  No bleeding issues MVR Normal valve function by echo 05/02/15 reviewed   SBE Sees dentist q 6 months  F/u with me in a  year  [name]

## 2016-10-05 ENCOUNTER — Ambulatory Visit (INDEPENDENT_AMBULATORY_CARE_PROVIDER_SITE_OTHER): Payer: BC Managed Care – PPO | Admitting: Cardiovascular Disease

## 2016-10-05 ENCOUNTER — Encounter: Payer: Self-pay | Admitting: Cardiovascular Disease

## 2016-10-05 VITALS — BP 128/80 | HR 74 | Ht 73.0 in | Wt 203.0 lb

## 2016-10-05 DIAGNOSIS — I10 Essential (primary) hypertension: Secondary | ICD-10-CM | POA: Diagnosis not present

## 2016-10-05 NOTE — Patient Instructions (Signed)

## 2016-10-13 ENCOUNTER — Ambulatory Visit (INDEPENDENT_AMBULATORY_CARE_PROVIDER_SITE_OTHER): Payer: BC Managed Care – PPO | Admitting: *Deleted

## 2016-10-13 DIAGNOSIS — Z952 Presence of prosthetic heart valve: Secondary | ICD-10-CM

## 2016-10-13 DIAGNOSIS — I059 Rheumatic mitral valve disease, unspecified: Secondary | ICD-10-CM

## 2016-10-13 DIAGNOSIS — Z7901 Long term (current) use of anticoagulants: Secondary | ICD-10-CM | POA: Diagnosis not present

## 2016-10-13 LAB — POCT INR: INR: 3.3

## 2016-10-14 ENCOUNTER — Other Ambulatory Visit (INDEPENDENT_AMBULATORY_CARE_PROVIDER_SITE_OTHER): Payer: Self-pay | Admitting: Internal Medicine

## 2016-11-10 ENCOUNTER — Ambulatory Visit (INDEPENDENT_AMBULATORY_CARE_PROVIDER_SITE_OTHER): Payer: BC Managed Care – PPO | Admitting: *Deleted

## 2016-11-10 DIAGNOSIS — I059 Rheumatic mitral valve disease, unspecified: Secondary | ICD-10-CM

## 2016-11-10 DIAGNOSIS — Z7901 Long term (current) use of anticoagulants: Secondary | ICD-10-CM

## 2016-11-10 DIAGNOSIS — Z952 Presence of prosthetic heart valve: Secondary | ICD-10-CM

## 2016-11-10 LAB — POCT INR: INR: 4.6

## 2016-11-23 ENCOUNTER — Ambulatory Visit: Payer: BC Managed Care – PPO | Admitting: Cardiovascular Disease

## 2016-12-18 ENCOUNTER — Other Ambulatory Visit: Payer: Self-pay | Admitting: Cardiovascular Disease

## 2016-12-29 ENCOUNTER — Other Ambulatory Visit (HOSPITAL_COMMUNITY): Payer: Self-pay | Admitting: Internal Medicine

## 2016-12-29 DIAGNOSIS — I872 Venous insufficiency (chronic) (peripheral): Secondary | ICD-10-CM

## 2017-01-03 ENCOUNTER — Ambulatory Visit (INDEPENDENT_AMBULATORY_CARE_PROVIDER_SITE_OTHER): Payer: BC Managed Care – PPO | Admitting: *Deleted

## 2017-01-03 DIAGNOSIS — Z7901 Long term (current) use of anticoagulants: Secondary | ICD-10-CM | POA: Diagnosis not present

## 2017-01-03 DIAGNOSIS — Z952 Presence of prosthetic heart valve: Secondary | ICD-10-CM

## 2017-01-03 DIAGNOSIS — I059 Rheumatic mitral valve disease, unspecified: Secondary | ICD-10-CM | POA: Diagnosis not present

## 2017-01-03 LAB — POCT INR: INR: 2.9

## 2017-01-05 ENCOUNTER — Ambulatory Visit (HOSPITAL_COMMUNITY)
Admission: RE | Admit: 2017-01-05 | Discharge: 2017-01-05 | Disposition: A | Discharge status: Home / Self Care | Payer: BC Managed Care – PPO | Source: Ambulatory Visit | Attending: Internal Medicine | Admitting: Internal Medicine

## 2017-01-05 DIAGNOSIS — M79606 Pain in leg, unspecified: Secondary | ICD-10-CM | POA: Diagnosis not present

## 2017-01-05 DIAGNOSIS — I10 Essential (primary) hypertension: Secondary | ICD-10-CM | POA: Insufficient documentation

## 2017-01-05 DIAGNOSIS — E785 Hyperlipidemia, unspecified: Secondary | ICD-10-CM | POA: Insufficient documentation

## 2017-01-05 DIAGNOSIS — I872 Venous insufficiency (chronic) (peripheral): Secondary | ICD-10-CM

## 2017-01-31 ENCOUNTER — Ambulatory Visit (INDEPENDENT_AMBULATORY_CARE_PROVIDER_SITE_OTHER): Payer: BC Managed Care – PPO | Admitting: *Deleted

## 2017-01-31 DIAGNOSIS — I059 Rheumatic mitral valve disease, unspecified: Secondary | ICD-10-CM | POA: Diagnosis not present

## 2017-01-31 DIAGNOSIS — Z7901 Long term (current) use of anticoagulants: Secondary | ICD-10-CM | POA: Diagnosis not present

## 2017-01-31 DIAGNOSIS — Z952 Presence of prosthetic heart valve: Secondary | ICD-10-CM | POA: Diagnosis not present

## 2017-01-31 LAB — POCT INR: INR: 3.9

## 2017-02-28 ENCOUNTER — Ambulatory Visit (INDEPENDENT_AMBULATORY_CARE_PROVIDER_SITE_OTHER): Payer: BC Managed Care – PPO | Admitting: *Deleted

## 2017-02-28 DIAGNOSIS — Z952 Presence of prosthetic heart valve: Secondary | ICD-10-CM | POA: Diagnosis not present

## 2017-02-28 DIAGNOSIS — Z7901 Long term (current) use of anticoagulants: Secondary | ICD-10-CM | POA: Diagnosis not present

## 2017-02-28 DIAGNOSIS — I059 Rheumatic mitral valve disease, unspecified: Secondary | ICD-10-CM | POA: Diagnosis not present

## 2017-02-28 LAB — POCT INR: INR: 3.1

## 2017-04-01 ENCOUNTER — Encounter: Payer: Self-pay | Admitting: *Deleted

## 2017-04-07 ENCOUNTER — Ambulatory Visit (INDEPENDENT_AMBULATORY_CARE_PROVIDER_SITE_OTHER): Payer: BC Managed Care – PPO | Admitting: *Deleted

## 2017-04-07 DIAGNOSIS — Z7901 Long term (current) use of anticoagulants: Secondary | ICD-10-CM | POA: Diagnosis not present

## 2017-04-07 DIAGNOSIS — Z952 Presence of prosthetic heart valve: Secondary | ICD-10-CM

## 2017-04-07 LAB — POCT INR: INR: 3.6

## 2017-05-04 ENCOUNTER — Other Ambulatory Visit (INDEPENDENT_AMBULATORY_CARE_PROVIDER_SITE_OTHER): Payer: Self-pay | Admitting: Internal Medicine

## 2017-05-04 ENCOUNTER — Ambulatory Visit (INDEPENDENT_AMBULATORY_CARE_PROVIDER_SITE_OTHER): Payer: BC Managed Care – PPO | Admitting: *Deleted

## 2017-05-04 DIAGNOSIS — Z7901 Long term (current) use of anticoagulants: Secondary | ICD-10-CM

## 2017-05-04 DIAGNOSIS — I059 Rheumatic mitral valve disease, unspecified: Secondary | ICD-10-CM

## 2017-05-04 DIAGNOSIS — Z952 Presence of prosthetic heart valve: Secondary | ICD-10-CM | POA: Diagnosis not present

## 2017-05-04 LAB — POCT INR: INR: 3.3

## 2017-05-04 NOTE — Patient Instructions (Signed)
Continue coumadin 1 tablet daily except 1 1/2 tablets on Mondays  Recheck in 4 weeks 

## 2017-05-10 ENCOUNTER — Other Ambulatory Visit: Payer: Self-pay | Admitting: *Deleted

## 2017-05-10 MED ORDER — WARFARIN SODIUM 5 MG PO TABS: ORAL_TABLET | ORAL | 3 refills | Status: DC

## 2017-06-01 ENCOUNTER — Ambulatory Visit (INDEPENDENT_AMBULATORY_CARE_PROVIDER_SITE_OTHER): Payer: BC Managed Care – PPO | Admitting: *Deleted

## 2017-06-01 DIAGNOSIS — Z7901 Long term (current) use of anticoagulants: Secondary | ICD-10-CM | POA: Diagnosis not present

## 2017-06-01 DIAGNOSIS — I059 Rheumatic mitral valve disease, unspecified: Secondary | ICD-10-CM

## 2017-06-01 DIAGNOSIS — Z952 Presence of prosthetic heart valve: Secondary | ICD-10-CM

## 2017-06-01 DIAGNOSIS — Z5181 Encounter for therapeutic drug level monitoring: Secondary | ICD-10-CM

## 2017-06-01 LAB — POCT INR: INR: 2.6

## 2017-06-01 NOTE — Patient Instructions (Signed)
Continue coumadin 1 tablet daily except 1 1/2 tablets on Mondays  Recheck in 6 weeks 

## 2017-07-13 ENCOUNTER — Ambulatory Visit (INDEPENDENT_AMBULATORY_CARE_PROVIDER_SITE_OTHER): Payer: BC Managed Care – PPO | Admitting: *Deleted

## 2017-07-13 DIAGNOSIS — Z7901 Long term (current) use of anticoagulants: Secondary | ICD-10-CM | POA: Diagnosis not present

## 2017-07-13 DIAGNOSIS — Z952 Presence of prosthetic heart valve: Secondary | ICD-10-CM

## 2017-07-13 DIAGNOSIS — I059 Rheumatic mitral valve disease, unspecified: Secondary | ICD-10-CM

## 2017-07-13 LAB — POCT INR: INR: 3.4

## 2017-07-13 NOTE — Patient Instructions (Signed)
Continue coumadin 1 tablet daily except 1 1/2 tablets on Mondays  Recheck in 6 weeks 

## 2017-08-15 ENCOUNTER — Ambulatory Visit (INDEPENDENT_AMBULATORY_CARE_PROVIDER_SITE_OTHER): Payer: BC Managed Care – PPO | Admitting: Internal Medicine

## 2017-09-07 ENCOUNTER — Ambulatory Visit (INDEPENDENT_AMBULATORY_CARE_PROVIDER_SITE_OTHER): Payer: BC Managed Care – PPO | Admitting: *Deleted

## 2017-09-07 DIAGNOSIS — I059 Rheumatic mitral valve disease, unspecified: Secondary | ICD-10-CM

## 2017-09-07 DIAGNOSIS — Z7901 Long term (current) use of anticoagulants: Secondary | ICD-10-CM

## 2017-09-07 DIAGNOSIS — Z952 Presence of prosthetic heart valve: Secondary | ICD-10-CM | POA: Diagnosis not present

## 2017-09-07 LAB — POCT INR: INR: 3.7 — AB (ref 2.0–3.0)

## 2017-09-07 NOTE — Patient Instructions (Signed)
Take coumadin 1/2 tablet tonight then resume 1 tablet daily except 1 1/2 tablets on Mondays Increase salads/greens Recheck in 4 weeks

## 2017-09-16 ENCOUNTER — Ambulatory Visit (INDEPENDENT_AMBULATORY_CARE_PROVIDER_SITE_OTHER): Payer: BC Managed Care – PPO | Admitting: Internal Medicine

## 2017-09-16 ENCOUNTER — Encounter (INDEPENDENT_AMBULATORY_CARE_PROVIDER_SITE_OTHER): Payer: Self-pay | Admitting: Internal Medicine

## 2017-09-16 VITALS — BP 130/80 | HR 60 | Temp 97.9°F | Ht 73.0 in | Wt 208.2 lb

## 2017-09-16 DIAGNOSIS — K219 Gastro-esophageal reflux disease without esophagitis: Secondary | ICD-10-CM

## 2017-09-16 DIAGNOSIS — R197 Diarrhea, unspecified: Secondary | ICD-10-CM

## 2017-09-16 NOTE — Patient Instructions (Signed)
OV In 1 year.  

## 2017-09-16 NOTE — Progress Notes (Signed)
   Subjective:    Patient ID: Luis Natalobert Brigance, PhD, male    DOB: 07/03/1950, 67 y.o.   MRN: [id number]  HPI Here today for f/u.  Last seen in April of 2018. Hx of chronic GERD and maintained on Protonix.  States he is doing good. He has noticed that some days his stools are formed and then he will have another BM that day and the BM will be loose. The next day, his BM will be normal. Alternates this pattern for 2-3 months. Has not seen any BRRB or melena.  His appetite has remained good. He has gained 4 pounds since his last visit. He is active. Continues to work.  Active in his garden. He travels.  Hx of Mitral valve replacement and maintained on Warfarin.   12/11/2015EGD &Colonoscopy  Indications:Patient is 67 year old Caucasian male who is chronically anticoagulated because he has a mechanical mitral valve. He experienced rectal bleeding lasting 3 days but one month ago followed by black stools. When the symptoms occurred he had taken a few doses of Advil. His H&H was normal. He denies nausea vomiting abdominal pain. Last EGD and colonoscopy were 8 or 9 years ago while he was living in [place].    Impression:  EGD findings; Erosive reflux esophagitis. Moderate-sized sliding hiatal hernia. Erosive gastritis and pyloric channel inflammation.  Colonoscopy findings; Examination performed to cecum. Pancolonic diverticulosis. Most of the diverticula are located at sigmoid colon. Internal hemorrhoids.   Review of Systems Past Medical History:  Diagnosis Date  . ALLERGIC RHINITIS   . H/O mitral valve replacement    10 yrs ago  . HYPERLIPIDEMIA   . Hypertension   . Long term current use of anticoagulant   . MITRAL VALVE PROLAPSE   . PREMATURE VENTRICULAR CONTRACTIONS    Current Outpatient Medications:  .  ezetimibe-simvastatin (VYTORIN) 10-20 MG tablet, Take 1 tablet by mouth daily., Disp: , Rfl:  .  Fish Oil-Cholecalciferol (FISH OIL + D3 PO), Take 1,000 mg  by mouth. , Disp: , Rfl:  .  lisinopril (PRINIVIL,ZESTRIL) 40 MG tablet, Take 40 mg by mouth daily. Reported on 07/14/2015, Disp: , Rfl:  .  montelukast (SINGULAIR) 10 MG tablet, Take 10 mg by mouth at bedtime., Disp: , Rfl:  .  nebivolol (BYSTOLIC) 5 MG tablet, Take 5 mg by mouth daily., Disp: , Rfl:  .  pantoprazole (PROTONIX) 40 MG tablet, take 1 tablet every morning, Disp: 30 tablet, Rfl: 5 .  warfarin (COUMADIN) 5 MG tablet, Take 1 tablet daily except 1 1/2 tablets on Mondays, Disp: 40 tablet, Rfl: 3      Objective:   Physical Exam Blood pressure 130/80, pulse 60, temperature 97.9 F (36.6 C), height 6\' 1"  (1.854 m), weight 208 lb 3.2 oz (94.4 kg). Alert and oriented. Skin warm and dry. Oral mucosa is moist.   . Sclera anicteric, conjunctivae is pink. Thyroid not enlarged. No cervical lymphadenopathy. Lungs clear. Heart regular rate and rhythm. Mitral valve heard.   Abdomen is soft. Bowel sounds are positive. No hepatomegaly. No abdominal masses felt. No tenderness.  No edema to lower extremities.           Assessment & Plan:  GERD. Continue the Protonix. Change in stool. Samples of Align given to patient. He is UTD on his colonoscopy. OV in 1 year.

## 2017-10-05 ENCOUNTER — Ambulatory Visit (INDEPENDENT_AMBULATORY_CARE_PROVIDER_SITE_OTHER): Payer: BC Managed Care – PPO | Admitting: *Deleted

## 2017-10-05 DIAGNOSIS — I059 Rheumatic mitral valve disease, unspecified: Secondary | ICD-10-CM | POA: Diagnosis not present

## 2017-10-05 DIAGNOSIS — Z7901 Long term (current) use of anticoagulants: Secondary | ICD-10-CM | POA: Diagnosis not present

## 2017-10-05 DIAGNOSIS — Z952 Presence of prosthetic heart valve: Secondary | ICD-10-CM | POA: Diagnosis not present

## 2017-10-05 LAB — POCT INR: INR: 3.4 — AB (ref 2.0–3.0)

## 2017-10-05 NOTE — Patient Instructions (Signed)
Continue coumadin 1 tablet daily except 1 1/2 tablets on Mondays Continue salads/greens Recheck in 4 weeks 

## 2017-10-08 ENCOUNTER — Other Ambulatory Visit: Payer: Self-pay | Admitting: Cardiovascular Disease

## 2017-11-05 ENCOUNTER — Other Ambulatory Visit (INDEPENDENT_AMBULATORY_CARE_PROVIDER_SITE_OTHER): Payer: Self-pay | Admitting: Internal Medicine

## 2017-11-21 ENCOUNTER — Ambulatory Visit (INDEPENDENT_AMBULATORY_CARE_PROVIDER_SITE_OTHER): Payer: BC Managed Care – PPO | Admitting: Pharmacist

## 2017-11-21 DIAGNOSIS — I059 Rheumatic mitral valve disease, unspecified: Secondary | ICD-10-CM | POA: Diagnosis not present

## 2017-11-21 DIAGNOSIS — Z7901 Long term (current) use of anticoagulants: Secondary | ICD-10-CM

## 2017-11-21 DIAGNOSIS — Z952 Presence of prosthetic heart valve: Secondary | ICD-10-CM | POA: Diagnosis not present

## 2017-11-21 LAB — POCT INR: INR: 2.3 (ref 2.0–3.0)

## 2017-11-21 NOTE — Patient Instructions (Signed)
Description   Take 2 tablets today then continue coumadin 1 tablet daily except 1 1/2 tablets on Mondays Continue salads/greens Recheck in 4 weeks

## 2017-12-20 ENCOUNTER — Encounter: Payer: Self-pay | Admitting: Cardiovascular Disease

## 2017-12-21 ENCOUNTER — Ambulatory Visit (INDEPENDENT_AMBULATORY_CARE_PROVIDER_SITE_OTHER): Payer: BC Managed Care – PPO | Admitting: *Deleted

## 2017-12-21 DIAGNOSIS — Z7901 Long term (current) use of anticoagulants: Secondary | ICD-10-CM

## 2017-12-21 DIAGNOSIS — Z5181 Encounter for therapeutic drug level monitoring: Secondary | ICD-10-CM | POA: Diagnosis not present

## 2017-12-21 DIAGNOSIS — I059 Rheumatic mitral valve disease, unspecified: Secondary | ICD-10-CM

## 2017-12-21 DIAGNOSIS — Z952 Presence of prosthetic heart valve: Secondary | ICD-10-CM

## 2017-12-21 LAB — POCT INR: INR: 4.2 — AB (ref 2.0–3.0)

## 2017-12-21 NOTE — Patient Instructions (Signed)
Hold coumadin today then continue coumadin 1 tablet daily except 1 1/2 tablets on Mondays Continue salads/greens Recheck in 3 weeks Started Meloxacam as needed

## 2017-12-25 ENCOUNTER — Other Ambulatory Visit: Payer: Self-pay | Admitting: Cardiovascular Disease

## 2018-01-11 ENCOUNTER — Encounter: Payer: Self-pay | Admitting: Cardiovascular Disease

## 2018-01-18 ENCOUNTER — Ambulatory Visit (INDEPENDENT_AMBULATORY_CARE_PROVIDER_SITE_OTHER): Payer: BC Managed Care – PPO | Admitting: *Deleted

## 2018-01-18 DIAGNOSIS — Z5181 Encounter for therapeutic drug level monitoring: Secondary | ICD-10-CM | POA: Diagnosis not present

## 2018-01-18 DIAGNOSIS — Z952 Presence of prosthetic heart valve: Secondary | ICD-10-CM

## 2018-01-18 DIAGNOSIS — I059 Rheumatic mitral valve disease, unspecified: Secondary | ICD-10-CM | POA: Diagnosis not present

## 2018-01-18 DIAGNOSIS — Z7901 Long term (current) use of anticoagulants: Secondary | ICD-10-CM | POA: Diagnosis not present

## 2018-01-18 LAB — POCT INR: INR: 2.9 (ref 2.0–3.0)

## 2018-01-18 NOTE — Patient Instructions (Signed)
Continue coumadin 1 tablet daily except 1 1/2 tablets on Mondays Continue salads/greens Recheck in 4 weeks Started Meloxacam as needed

## 2018-02-03 ENCOUNTER — Ambulatory Visit: Payer: BC Managed Care – PPO | Admitting: Cardiovascular Disease

## 2018-02-15 ENCOUNTER — Ambulatory Visit (INDEPENDENT_AMBULATORY_CARE_PROVIDER_SITE_OTHER): Payer: BC Managed Care – PPO | Admitting: *Deleted

## 2018-02-15 DIAGNOSIS — I059 Rheumatic mitral valve disease, unspecified: Secondary | ICD-10-CM

## 2018-02-15 DIAGNOSIS — Z5181 Encounter for therapeutic drug level monitoring: Secondary | ICD-10-CM

## 2018-02-15 DIAGNOSIS — Z7901 Long term (current) use of anticoagulants: Secondary | ICD-10-CM

## 2018-02-15 DIAGNOSIS — Z952 Presence of prosthetic heart valve: Secondary | ICD-10-CM

## 2018-02-15 LAB — POCT INR: INR: 3.4 — AB (ref 2.0–3.0)

## 2018-02-15 NOTE — Patient Instructions (Signed)
Continue coumadin 1 tablet daily except 1 1/2 tablets on Mondays Continue salads/greens Recheck in 5 weeks

## 2018-03-09 NOTE — Progress Notes (Signed)
Patient ID: Luis Natalobert Leppanen, PhD, male   DOB: 12/03/1950, 67 y.o.   MRN: [id number]     Luis Haynes is seen today in followup for his hypertension and mitral  valve replacement.   INR;s have been Rx  There's been no bleeding diathesis. He's been following his SBE prophylaxis. He's been compliant with his blood pressure medication. Otherwise he's not had any significant chest pain PND or orthopnea. Has been no TIA or CVA. Has been no signs of heart failure Has ? venous insuficiency with pain in LE;s that has been markedly improved with support hose   Echo  05/02/15  Study Conclusions  - Left ventricle: The cavity size was normal. Wall thickness was   increased in a pattern of severe LVH. Systolic function was   normal. The estimated ejection fraction was in the range of 55%   to 60%. Doppler parameters are consistent with abnormal left   ventricular relaxation (grade 1 diastolic dysfunction). Doppler   parameters are consistent with high ventricular filling pressure. - Regional wall motion abnormality: Possible hypokinesis of the mid   inferior and mid inferolateral myocardium. - Ventricular septum: Septal motion showed abnormal function and   dyssynergy. These changes are consistent with intraventricular   conduction delay. - Mitral valve: Normally functioning mechanical mitral prosthesis   noted. There was no significant regurgitation. Valve area by   pressure half-time: 1.93 cm^2. - Left atrium: The atrium was mildly dilated. - Tricuspid valve: There was mild regurgitation. - Pulmonary arteries: Systolic pressure was mildly increased. PA   peak pressure: 32 mm Hg (S).   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 [place] [employer]    ROS: Denies fever, malais, weight loss, blurry vision, decreased visual acuity, cough, sputum, SOB, hemoptysis, pleuritic pain,  palpitaitons, heartburn, abdominal pain, melena, lower extremity edema, claudication, or rash.  All other systems reviewed and negative  General: Affect appropriate Healthy:  appears stated age HEENT: normal Neck supple with no adenopathy JVP normal no bruits no thyromegaly Lungs clear with no wheezing and good diaphragmatic motion Heart:  S1 click /S2 midl MR  murmur, no rub, gallop or click PMI normal Abdomen: benighn, BS positve, no tenderness, no AAA no bruit.  No HSM or HJR Distal pulses intact with no bruits No edema Neuro non-focal Skin warm and dry No muscular weakness   Current Outpatient Medications  Medication Sig Dispense Refill  . ezetimibe-simvastatin (VYTORIN) 10-20 MG tablet Take 1 tablet by mouth daily.    Marland Kitchen. Fish Oil-Cholecalciferol (FISH OIL + D3 PO) Take 1,000 mg by mouth.     Marland Kitchen. lisinopril (PRINIVIL,ZESTRIL) 40 MG tablet Take 40 mg by mouth daily. Reported on 07/14/2015    . montelukast (SINGULAIR) 10 MG tablet Take 10 mg by mouth at bedtime.    . nebivolol (BYSTOLIC) 5 MG tablet Take 5 mg by mouth daily.    . pantoprazole (PROTONIX) 40 MG tablet TAKE 1 TABLET BY MOUTH EVERY MORNING 90 tablet 3  . warfarin (COUMADIN) 5 MG tablet TAKE 1 TABLET BY MOUTH DAILY EXCEPT TAKE 1& 1/2 TABLET ON MONDAYS 40 tablet 6   No current facility-administered medications for this visit.     Allergies  Amoxicillin; Erythromycin; and Sulfonamide derivatives  Electrocardiogram:  NSR rate 73 RBBB LAD LAE possible old inf/ant MI's 10/05/16  SR RBBB rate 71   Assessment and Plan  RBBB: Chronic no high grade  heart block yearly ECG  GERD low carb diet protonix f/u GI previous EGD with Rehman  Anticoagulation Rx has some greens weekly but stable number  No bleeding issues  MVR Normal valve function by echo 05/02/15 reviewed   SBE Sees dentist q 6 months  HLD:  Continue vytorin labs with primary    F/u with me in a year  [employer]

## 2018-03-13 ENCOUNTER — Encounter: Payer: Self-pay | Admitting: Cardiovascular Disease

## 2018-03-13 ENCOUNTER — Ambulatory Visit: Payer: BC Managed Care – PPO | Admitting: Cardiovascular Disease

## 2018-03-13 VITALS — BP 126/82 | HR 80 | Ht 73.0 in | Wt 207.0 lb

## 2018-03-13 DIAGNOSIS — Z9889 Other specified postprocedural states: Secondary | ICD-10-CM

## 2018-03-13 NOTE — Patient Instructions (Signed)

## 2018-03-22 ENCOUNTER — Ambulatory Visit (INDEPENDENT_AMBULATORY_CARE_PROVIDER_SITE_OTHER): Payer: BC Managed Care – PPO | Admitting: *Deleted

## 2018-03-22 DIAGNOSIS — I059 Rheumatic mitral valve disease, unspecified: Secondary | ICD-10-CM

## 2018-03-22 DIAGNOSIS — Z5181 Encounter for therapeutic drug level monitoring: Secondary | ICD-10-CM

## 2018-03-22 DIAGNOSIS — Z7901 Long term (current) use of anticoagulants: Secondary | ICD-10-CM

## 2018-03-22 DIAGNOSIS — Z952 Presence of prosthetic heart valve: Secondary | ICD-10-CM | POA: Diagnosis not present

## 2018-03-22 LAB — POCT INR: INR: 3.4 — AB (ref 2.0–3.0)

## 2018-03-22 NOTE — Patient Instructions (Signed)
Continue coumadin 1 tablet daily except 1 1/2 tablets on Mondays Continue salads/greens Recheck in 6 weeks 

## 2018-04-18 ENCOUNTER — Encounter: Payer: Self-pay | Admitting: *Deleted

## 2018-05-10 ENCOUNTER — Ambulatory Visit (INDEPENDENT_AMBULATORY_CARE_PROVIDER_SITE_OTHER): Payer: BC Managed Care – PPO | Admitting: Pharmacist

## 2018-05-10 DIAGNOSIS — Z952 Presence of prosthetic heart valve: Secondary | ICD-10-CM | POA: Diagnosis not present

## 2018-05-10 DIAGNOSIS — I059 Rheumatic mitral valve disease, unspecified: Secondary | ICD-10-CM | POA: Diagnosis not present

## 2018-05-10 DIAGNOSIS — Z5181 Encounter for therapeutic drug level monitoring: Secondary | ICD-10-CM | POA: Diagnosis not present

## 2018-05-10 DIAGNOSIS — Z7901 Long term (current) use of anticoagulants: Secondary | ICD-10-CM

## 2018-05-10 LAB — POCT INR: INR: 3.7 — AB (ref 2.0–3.0)

## 2018-05-10 NOTE — Patient Instructions (Signed)
Description   No warfarin today then continue coumadin 1 tablet daily except 1 1/2 tablets on Mondays Continue salads/greens Recheck in 4 weeks

## 2018-05-23 ENCOUNTER — Ambulatory Visit (HOSPITAL_COMMUNITY)
Admission: RE | Admit: 2018-05-23 | Discharge: 2018-05-23 | Disposition: A | Discharge status: Home / Self Care | Payer: BC Managed Care – PPO | Source: Ambulatory Visit | Attending: Nurse Practitioner | Admitting: Nurse Practitioner

## 2018-05-23 ENCOUNTER — Other Ambulatory Visit (HOSPITAL_COMMUNITY): Payer: Self-pay | Admitting: Nurse Practitioner

## 2018-05-23 DIAGNOSIS — R059 Cough, unspecified: Secondary | ICD-10-CM

## 2018-05-23 DIAGNOSIS — R05 Cough: Secondary | ICD-10-CM | POA: Insufficient documentation

## 2018-06-21 ENCOUNTER — Ambulatory Visit (INDEPENDENT_AMBULATORY_CARE_PROVIDER_SITE_OTHER): Payer: BC Managed Care – PPO | Admitting: *Deleted

## 2018-06-21 DIAGNOSIS — I059 Rheumatic mitral valve disease, unspecified: Secondary | ICD-10-CM | POA: Diagnosis not present

## 2018-06-21 DIAGNOSIS — Z7901 Long term (current) use of anticoagulants: Secondary | ICD-10-CM | POA: Diagnosis not present

## 2018-06-21 DIAGNOSIS — Z5181 Encounter for therapeutic drug level monitoring: Secondary | ICD-10-CM | POA: Diagnosis not present

## 2018-06-21 DIAGNOSIS — Z952 Presence of prosthetic heart valve: Secondary | ICD-10-CM

## 2018-06-21 LAB — POCT INR: INR: 2.8 (ref 2.0–3.0)

## 2018-06-21 NOTE — Patient Instructions (Signed)
Continue coumadin 1 tablet daily except 1 1/2 tablets on Mondays Continue salads/greens Recheck in 4 weeks

## 2018-07-18 ENCOUNTER — Telehealth: Payer: Self-pay | Admitting: Cardiovascular Disease

## 2018-07-18 NOTE — Telephone Encounter (Signed)
° ° ° °  COVID-19 Pre-Screening Questions: ° °• Do you currently have a fever? No °•  °• Have you recently travelled on a cruise, internationally, or to [place], [place], [place], [place], [place], or [place], [place] ([employer]) ? No °•  °• Have you been in contact with someone that is currently pending confirmation of Covid19 testing or has been confirmed to have the Covid19 virus? No °•  °• Are you currently experiencing fatigue or cough? No  ° ° °   ° ° ° ° ° °

## 2018-07-18 NOTE — Telephone Encounter (Signed)
Left message for patient to confirm, prescreen and register for appointment scheduled on July 19, 2018 °  °  °  °COVID-19 Pre-Screening Questions: °  °· Do you currently have a fever? °·   °· Have you recently travelled on a cruise, internationally, or to [place], [place], [place], [place], [place], or [place], [place] ([employer] ?  °  °  °· Have you been in contact with someone that is currently pending confirmation of Covid19 testing or has been confirmed to have the Covid19 virus?   °·   °Are you currently experiencing fatigue or cough?  ° °

## 2018-07-19 ENCOUNTER — Ambulatory Visit (INDEPENDENT_AMBULATORY_CARE_PROVIDER_SITE_OTHER): Payer: BC Managed Care – PPO | Admitting: *Deleted

## 2018-07-19 ENCOUNTER — Other Ambulatory Visit: Payer: Self-pay

## 2018-07-19 DIAGNOSIS — Z7901 Long term (current) use of anticoagulants: Secondary | ICD-10-CM | POA: Diagnosis not present

## 2018-07-19 DIAGNOSIS — I059 Rheumatic mitral valve disease, unspecified: Secondary | ICD-10-CM | POA: Diagnosis not present

## 2018-07-19 DIAGNOSIS — Z952 Presence of prosthetic heart valve: Secondary | ICD-10-CM

## 2018-07-19 DIAGNOSIS — Z5181 Encounter for therapeutic drug level monitoring: Secondary | ICD-10-CM | POA: Diagnosis not present

## 2018-07-19 LAB — POCT INR: INR: 3 (ref 2.0–3.0)

## 2018-07-19 NOTE — Patient Instructions (Signed)
Continue coumadin 1 tablet daily except 1 1/2 tablets on Mondays Continue salads/greens Recheck in 6 weeks

## 2018-08-26 ENCOUNTER — Other Ambulatory Visit: Payer: Self-pay | Admitting: Cardiovascular Disease

## 2018-08-31 ENCOUNTER — Ambulatory Visit (INDEPENDENT_AMBULATORY_CARE_PROVIDER_SITE_OTHER): Payer: BC Managed Care – PPO | Admitting: *Deleted

## 2018-08-31 DIAGNOSIS — I059 Rheumatic mitral valve disease, unspecified: Secondary | ICD-10-CM

## 2018-08-31 DIAGNOSIS — Z952 Presence of prosthetic heart valve: Secondary | ICD-10-CM

## 2018-08-31 DIAGNOSIS — Z5181 Encounter for therapeutic drug level monitoring: Secondary | ICD-10-CM

## 2018-08-31 DIAGNOSIS — Z7901 Long term (current) use of anticoagulants: Secondary | ICD-10-CM | POA: Diagnosis not present

## 2018-08-31 LAB — POCT INR: INR: 3.7 — AB (ref 2.0–3.0)

## 2018-08-31 NOTE — Patient Instructions (Signed)
Take 1/2 tablet tonight then resume 1 tablet daily except 1 1/2 tablets on Mondays Continue salads/greens Recheck in 6 weeks

## 2018-10-17 ENCOUNTER — Ambulatory Visit (INDEPENDENT_AMBULATORY_CARE_PROVIDER_SITE_OTHER): Payer: BC Managed Care – PPO | Admitting: *Deleted

## 2018-10-17 ENCOUNTER — Other Ambulatory Visit: Payer: Self-pay

## 2018-10-17 DIAGNOSIS — Z952 Presence of prosthetic heart valve: Secondary | ICD-10-CM | POA: Diagnosis not present

## 2018-10-17 DIAGNOSIS — Z7901 Long term (current) use of anticoagulants: Secondary | ICD-10-CM

## 2018-10-17 DIAGNOSIS — Z5181 Encounter for therapeutic drug level monitoring: Secondary | ICD-10-CM | POA: Diagnosis not present

## 2018-10-17 DIAGNOSIS — I059 Rheumatic mitral valve disease, unspecified: Secondary | ICD-10-CM

## 2018-10-17 LAB — POCT INR: INR: 3.7 — AB (ref 2.0–3.0)

## 2018-10-17 NOTE — Patient Instructions (Signed)
Take 1/2 tablet tonight then decrease dose to 1 tablet daily Continue salads/greens Recheck in 6 weeks

## 2018-10-26 ENCOUNTER — Other Ambulatory Visit (INDEPENDENT_AMBULATORY_CARE_PROVIDER_SITE_OTHER): Payer: Self-pay | Admitting: Internal Medicine

## 2018-11-30 ENCOUNTER — Ambulatory Visit (INDEPENDENT_AMBULATORY_CARE_PROVIDER_SITE_OTHER): Payer: BC Managed Care – PPO | Admitting: *Deleted

## 2018-11-30 ENCOUNTER — Other Ambulatory Visit: Payer: Self-pay

## 2018-11-30 DIAGNOSIS — Z952 Presence of prosthetic heart valve: Secondary | ICD-10-CM | POA: Diagnosis not present

## 2018-11-30 DIAGNOSIS — Z5181 Encounter for therapeutic drug level monitoring: Secondary | ICD-10-CM

## 2018-11-30 DIAGNOSIS — I059 Rheumatic mitral valve disease, unspecified: Secondary | ICD-10-CM

## 2018-11-30 DIAGNOSIS — Z7901 Long term (current) use of anticoagulants: Secondary | ICD-10-CM

## 2018-11-30 LAB — POCT INR: INR: 3.7 — AB (ref 2.0–3.0)

## 2018-11-30 NOTE — Patient Instructions (Signed)
Take 1/2 tablet tonight then resume 1 tablet daily Continue salads/greens Recheck in 6 weeks

## 2019-01-09 ENCOUNTER — Other Ambulatory Visit: Payer: Self-pay

## 2019-01-09 ENCOUNTER — Ambulatory Visit (INDEPENDENT_AMBULATORY_CARE_PROVIDER_SITE_OTHER): Payer: BC Managed Care – PPO | Admitting: *Deleted

## 2019-01-09 DIAGNOSIS — I059 Rheumatic mitral valve disease, unspecified: Secondary | ICD-10-CM | POA: Diagnosis not present

## 2019-01-09 DIAGNOSIS — Z952 Presence of prosthetic heart valve: Secondary | ICD-10-CM

## 2019-01-09 DIAGNOSIS — Z7901 Long term (current) use of anticoagulants: Secondary | ICD-10-CM

## 2019-01-09 DIAGNOSIS — Z5181 Encounter for therapeutic drug level monitoring: Secondary | ICD-10-CM

## 2019-01-09 LAB — POCT INR: INR: 3.4 — AB (ref 2.0–3.0)

## 2019-01-09 NOTE — Patient Instructions (Signed)
Continue coumadin 1 tablet daily Continue salads/greens Recheck in 6 weeks

## 2019-02-12 ENCOUNTER — Other Ambulatory Visit: Payer: Self-pay

## 2019-02-12 DIAGNOSIS — Z20822 Contact with and (suspected) exposure to covid-19: Secondary | ICD-10-CM

## 2019-02-14 LAB — NOVEL CORONAVIRUS, NAA: SARS-CoV-2, NAA: NOT DETECTED

## 2019-02-20 ENCOUNTER — Other Ambulatory Visit: Payer: Self-pay

## 2019-02-20 ENCOUNTER — Ambulatory Visit (INDEPENDENT_AMBULATORY_CARE_PROVIDER_SITE_OTHER): Payer: BC Managed Care – PPO | Admitting: *Deleted

## 2019-02-20 DIAGNOSIS — Z7901 Long term (current) use of anticoagulants: Secondary | ICD-10-CM | POA: Diagnosis not present

## 2019-02-20 DIAGNOSIS — Z952 Presence of prosthetic heart valve: Secondary | ICD-10-CM

## 2019-02-20 DIAGNOSIS — I059 Rheumatic mitral valve disease, unspecified: Secondary | ICD-10-CM | POA: Diagnosis not present

## 2019-02-20 DIAGNOSIS — Z5181 Encounter for therapeutic drug level monitoring: Secondary | ICD-10-CM | POA: Diagnosis not present

## 2019-02-20 LAB — POCT INR: INR: 4.7 — AB (ref 2.0–3.0)

## 2019-02-20 NOTE — Patient Instructions (Signed)
Hold warfarin tonight then decrease dose to 1 tablet daily except 1/2 tablet on Wednesdays Continue salads/greens Recheck in 3 weeks

## 2019-03-13 ENCOUNTER — Ambulatory Visit (INDEPENDENT_AMBULATORY_CARE_PROVIDER_SITE_OTHER): Payer: BC Managed Care – PPO | Admitting: *Deleted

## 2019-03-13 ENCOUNTER — Other Ambulatory Visit: Payer: Self-pay

## 2019-03-13 DIAGNOSIS — I059 Rheumatic mitral valve disease, unspecified: Secondary | ICD-10-CM

## 2019-03-13 DIAGNOSIS — Z7901 Long term (current) use of anticoagulants: Secondary | ICD-10-CM | POA: Diagnosis not present

## 2019-03-13 DIAGNOSIS — Z952 Presence of prosthetic heart valve: Secondary | ICD-10-CM

## 2019-03-13 DIAGNOSIS — Z5181 Encounter for therapeutic drug level monitoring: Secondary | ICD-10-CM

## 2019-03-13 LAB — POCT INR: INR: 3.6 — AB (ref 2.0–3.0)

## 2019-03-13 NOTE — Patient Instructions (Signed)
Decrease coumadin to 1 tablet daily except 1/2 tablet on Tuesdays and Fridays Continue salads/greens Recheck in 4 weeks

## 2019-04-04 NOTE — Progress Notes (Signed)
Patient ID: Luis Natal, PhD, male   DOB: May 05, 1950, 68 y.o.   MRN: [id number]     Luis Haynes is seen today in followup for his hypertension and mitral  valve replacement.   INR;s have been Rx  Most recent 03/13/19 3.6 There's been no bleeding diathesis. He's been following his SBE prophylaxis. He's been compliant with his blood pressure medication. Otherwise he's not had any significant chest pain PND or orthopnea. Has been no TIA or CVA. Has been no signs of heart failure Has ? venous insuficiency with pain in LE;s that has been markedly improved with support hose   Echo  05/02/15  Study Conclusions  - Left ventricle: The cavity size was normal. Wall thickness was   increased in a pattern of severe LVH. Systolic function was   normal. The estimated ejection fraction was in the range of 55%   to 60%. Doppler parameters are consistent with abnormal left   ventricular relaxation (grade 1 diastolic dysfunction). Doppler   parameters are consistent with high ventricular filling pressure. - Regional wall motion abnormality: Possible hypokinesis of the mid   inferior and mid inferolateral myocardium. - Ventricular septum: Septal motion showed abnormal function and   dyssynergy. These changes are consistent with intraventricular   conduction delay. - Mitral valve: Normally functioning mechanical mitral prosthesis   noted. There was no significant regurgitation. Valve area by   pressure half-time: 1.93 cm^2. - Left atrium: The atrium was mildly dilated. - Tricuspid valve: There was mild regurgitation. - Pulmonary arteries: Systolic pressure was mildly increased. PA   peak pressure: 32 mm Hg (S).   2015 Had colonoscopy with lovenox bridge with Dr Karilyn Cota Erosive esophagitiis and pan colonic diverticulosis Enjoys cooking shows with wife. Went  to [place] [employer] last year No such travel now With COVID  INR somewhat labile no bleeding issues    ROS: Denies fever,  malais, weight loss, blurry vision, decreased visual acuity, cough, sputum, SOB, hemoptysis, pleuritic pain, palpitaitons, heartburn, abdominal pain, melena, lower extremity edema, claudication, or rash.  All other systems reviewed and negative  General: Affect appropriate Healthy:  appears stated age HEENT: normal Neck supple with no adenopathy JVP normal no bruits no thyromegaly Lungs clear with no wheezing and good diaphragmatic motion Heart:  S1 click /S2 midl MR  murmur, no rub, gallop or click PMI normal Abdomen: benighn, BS positve, no tenderness, no AAA no bruit.  No HSM or HJR Distal pulses intact with no bruits No edema Neuro non-focal Skin warm and dry No muscular weakness   Current Outpatient Medications  Medication Sig Dispense Refill  . ezetimibe-simvastatin (VYTORIN) 10-20 MG tablet Take 1 tablet by mouth daily.    Marland Kitchen lisinopril (PRINIVIL,ZESTRIL) 40 MG tablet Take 40 mg by mouth daily. Reported on 07/14/2015    . montelukast (SINGULAIR) 10 MG tablet Take 10 mg by mouth at bedtime.    . nebivolol (BYSTOLIC) 5 MG tablet Take 5 mg by mouth daily.    . pantoprazole (PROTONIX) 40 MG tablet TAKE 1 TABLET BY MOUTH EVERY MORNING 90 tablet 3  . warfarin (COUMADIN) 5 MG tablet TAKE AS DIRECTED BY COUMADIN CLINIC 30 tablet 3   No current facility-administered medications for this visit.    Allergies  Amoxicillin, Erythromycin, and Sulfonamide derivatives  Electrocardiogram:  NSR rate 73 RBBB LAD LAE possible old inf/ant MI's 10/05/16  SR RBBB rate 71   Assessment and Plan  RBBB: Chronic no high grade heart block yearly ECG  GERD  low carb diet protonix f/u GI previous EGD with Rehman  Anticoagulation Rx has some greens weekly but stable number  No bleeding issues  MVR Normal valve function by echo 05/02/15 reviewed   SBE Sees dentist q 6 months  HLD:  Continue vytorin labs with primary    F/u with me in a year  [employer]

## 2019-04-06 ENCOUNTER — Other Ambulatory Visit: Payer: Self-pay | Admitting: Cardiovascular Disease

## 2019-04-09 ENCOUNTER — Other Ambulatory Visit: Payer: Self-pay

## 2019-04-09 ENCOUNTER — Ambulatory Visit (INDEPENDENT_AMBULATORY_CARE_PROVIDER_SITE_OTHER): Payer: BC Managed Care – PPO | Admitting: *Deleted

## 2019-04-09 DIAGNOSIS — Z5181 Encounter for therapeutic drug level monitoring: Secondary | ICD-10-CM | POA: Diagnosis not present

## 2019-04-09 DIAGNOSIS — Z7901 Long term (current) use of anticoagulants: Secondary | ICD-10-CM

## 2019-04-09 DIAGNOSIS — I059 Rheumatic mitral valve disease, unspecified: Secondary | ICD-10-CM | POA: Diagnosis not present

## 2019-04-09 DIAGNOSIS — Z952 Presence of prosthetic heart valve: Secondary | ICD-10-CM

## 2019-04-09 LAB — POCT INR: INR: 2.2 (ref 2.0–3.0)

## 2019-04-09 NOTE — Patient Instructions (Signed)
Take 1 1/2 tablets tonight then increase dose to 1 tablet daily except 1/2 tablet on Tuesdays  Continue salads/greens Recheck in 4 weeks

## 2019-04-10 ENCOUNTER — Encounter: Payer: Self-pay | Admitting: Cardiovascular Disease

## 2019-04-10 ENCOUNTER — Ambulatory Visit: Payer: BC Managed Care – PPO | Admitting: Cardiovascular Disease

## 2019-04-10 VITALS — BP 135/96 | HR 70 | Temp 97.3°F | Ht 73.0 in | Wt 209.0 lb

## 2019-04-10 DIAGNOSIS — Z9889 Other specified postprocedural states: Secondary | ICD-10-CM | POA: Diagnosis not present

## 2019-04-10 NOTE — Patient Instructions (Signed)

## 2019-05-07 ENCOUNTER — Other Ambulatory Visit: Payer: Self-pay

## 2019-05-07 ENCOUNTER — Ambulatory Visit: Payer: BC Managed Care – PPO | Attending: Internal Medicine

## 2019-05-07 DIAGNOSIS — Z20822 Contact with and (suspected) exposure to covid-19: Secondary | ICD-10-CM

## 2019-05-08 ENCOUNTER — Ambulatory Visit (INDEPENDENT_AMBULATORY_CARE_PROVIDER_SITE_OTHER): Payer: BC Managed Care – PPO | Admitting: *Deleted

## 2019-05-08 DIAGNOSIS — Z5181 Encounter for therapeutic drug level monitoring: Secondary | ICD-10-CM

## 2019-05-08 DIAGNOSIS — Z952 Presence of prosthetic heart valve: Secondary | ICD-10-CM

## 2019-05-08 DIAGNOSIS — I059 Rheumatic mitral valve disease, unspecified: Secondary | ICD-10-CM

## 2019-05-08 DIAGNOSIS — Z7901 Long term (current) use of anticoagulants: Secondary | ICD-10-CM | POA: Diagnosis not present

## 2019-05-08 LAB — POCT INR: INR: 2.5 (ref 2.0–3.0)

## 2019-05-08 LAB — NOVEL CORONAVIRUS, NAA: SARS-CoV-2, NAA: NOT DETECTED

## 2019-05-08 NOTE — Patient Instructions (Signed)
Continue warfarin 1 tablet daily except 1/2 tablet on Tuesdays  Continue salads/greens Recheck in 4 weeks

## 2019-05-31 ENCOUNTER — Other Ambulatory Visit: Payer: Self-pay

## 2019-05-31 ENCOUNTER — Encounter (HOSPITAL_COMMUNITY): Payer: Self-pay | Admitting: *Deleted

## 2019-05-31 ENCOUNTER — Inpatient Hospital Stay (HOSPITAL_COMMUNITY)
Admission: EM | Admit: 2019-05-31 | Discharge: 2019-06-03 | DRG: 378 | Disposition: A | Discharge status: Home / Self Care | Payer: BC Managed Care – PPO | Attending: Internal Medicine | Admitting: Internal Medicine

## 2019-05-31 DIAGNOSIS — K922 Gastrointestinal hemorrhage, unspecified: Secondary | ICD-10-CM

## 2019-05-31 DIAGNOSIS — K2100 Gastro-esophageal reflux disease with esophagitis, without bleeding: Secondary | ICD-10-CM | POA: Diagnosis present

## 2019-05-31 DIAGNOSIS — I10 Essential (primary) hypertension: Secondary | ICD-10-CM | POA: Diagnosis present

## 2019-05-31 DIAGNOSIS — Z882 Allergy status to sulfonamides status: Secondary | ICD-10-CM

## 2019-05-31 DIAGNOSIS — Z888 Allergy status to other drugs, medicaments and biological substances status: Secondary | ICD-10-CM

## 2019-05-31 DIAGNOSIS — K625 Hemorrhage of anus and rectum: Secondary | ICD-10-CM

## 2019-05-31 DIAGNOSIS — Z79899 Other long term (current) drug therapy: Secondary | ICD-10-CM

## 2019-05-31 DIAGNOSIS — Z8719 Personal history of other diseases of the digestive system: Secondary | ICD-10-CM

## 2019-05-31 DIAGNOSIS — K644 Residual hemorrhoidal skin tags: Secondary | ICD-10-CM | POA: Diagnosis present

## 2019-05-31 DIAGNOSIS — K5731 Diverticulosis of large intestine without perforation or abscess with bleeding: Secondary | ICD-10-CM | POA: Diagnosis not present

## 2019-05-31 DIAGNOSIS — K449 Diaphragmatic hernia without obstruction or gangrene: Secondary | ICD-10-CM | POA: Diagnosis present

## 2019-05-31 DIAGNOSIS — Z20822 Contact with and (suspected) exposure to covid-19: Secondary | ICD-10-CM | POA: Diagnosis present

## 2019-05-31 DIAGNOSIS — E785 Hyperlipidemia, unspecified: Secondary | ICD-10-CM | POA: Diagnosis present

## 2019-05-31 DIAGNOSIS — D62 Acute posthemorrhagic anemia: Secondary | ICD-10-CM

## 2019-05-31 DIAGNOSIS — J309 Allergic rhinitis, unspecified: Secondary | ICD-10-CM | POA: Diagnosis present

## 2019-05-31 DIAGNOSIS — Z880 Allergy status to penicillin: Secondary | ICD-10-CM

## 2019-05-31 DIAGNOSIS — K648 Other hemorrhoids: Secondary | ICD-10-CM | POA: Diagnosis present

## 2019-05-31 DIAGNOSIS — Z7901 Long term (current) use of anticoagulants: Secondary | ICD-10-CM

## 2019-05-31 DIAGNOSIS — Z952 Presence of prosthetic heart valve: Secondary | ICD-10-CM

## 2019-05-31 LAB — CBC WITH DIFFERENTIAL/PLATELET
Abs Immature Granulocytes: 0.03 10*3/uL (ref 0.00–0.07)
Basophils Absolute: 0.1 10*3/uL (ref 0.0–0.1)
Basophils Relative: 1 %
Eosinophils Absolute: 0.4 10*3/uL (ref 0.0–0.5)
Eosinophils Relative: 5 %
HCT: 49 % (ref 39.0–52.0)
Hemoglobin: 15.4 g/dL (ref 13.0–17.0)
Immature Granulocytes: 0 %
Lymphocytes Relative: 35 %
Lymphs Abs: 2.6 10*3/uL (ref 0.7–4.0)
MCH: 29.6 pg (ref 26.0–34.0)
MCHC: 31.4 g/dL (ref 30.0–36.0)
MCV: 94 fL (ref 80.0–100.0)
Monocytes Absolute: 0.6 10*3/uL (ref 0.1–1.0)
Monocytes Relative: 8 %
Neutro Abs: 3.8 10*3/uL (ref 1.7–7.7)
Neutrophils Relative %: 51 %
Platelets: 271 10*3/uL (ref 150–400)
RBC: 5.21 MIL/uL (ref 4.22–5.81)
RDW: 13.4 % (ref 11.5–15.5)
WBC: 7.4 10*3/uL (ref 4.0–10.5)
nRBC: 0 % (ref 0.0–0.2)

## 2019-05-31 LAB — HEMOGLOBIN AND HEMATOCRIT, BLOOD
HCT: 42.7 % (ref 39.0–52.0)
HCT: 39.9 % (ref 39.0–52.0)
Hemoglobin: 13.5 g/dL (ref 13.0–17.0)
Hemoglobin: 12.7 g/dL — ABNORMAL LOW (ref 13.0–17.0)

## 2019-05-31 LAB — BASIC METABOLIC PANEL
Anion gap: 5 (ref 5–15)
BUN: 28 mg/dL — ABNORMAL HIGH (ref 8–23)
CO2: 28 mmol/L (ref 22–32)
Calcium: 9.3 mg/dL (ref 8.9–10.3)
Chloride: 108 mmol/L (ref 98–111)
Creatinine, Ser: 1.13 mg/dL (ref 0.61–1.24)
GFR calc Af Amer: >60 mL/min (ref >60)
GFR calc non Af Amer: >60 mL/min (ref >60)
Glucose, Bld: 134 mg/dL — ABNORMAL HIGH (ref 70–99)
Potassium: 3.8 mmol/L (ref 3.5–5.1)
Sodium: 141 mmol/L (ref 135–145)

## 2019-05-31 LAB — TYPE AND SCREEN
ABO/RH(D): A POS
Antibody Screen: NEGATIVE

## 2019-05-31 LAB — HIV ANTIBODY (ROUTINE TESTING W REFLEX): HIV Screen 4th Generation wRfx: NONREACTIVE

## 2019-05-31 LAB — PROTIME-INR
INR: 2.4 — ABNORMAL HIGH (ref 0.8–1.2)
Prothrombin Time: 25.7 seconds — ABNORMAL HIGH (ref 11.4–15.2)

## 2019-05-31 LAB — SARS CORONAVIRUS 2 (TAT 6-24 HRS): SARS Coronavirus 2: NEGATIVE

## 2019-05-31 LAB — MRSA PCR SCREENING: MRSA by PCR: NEGATIVE

## 2019-05-31 MED ORDER — CIPROFLOXACIN IN D5W 400 MG/200ML IV SOLN
400.0000 mg | Freq: Two times a day (BID) | INTRAVENOUS | Status: DC
  Administered 2019-05-31 – 2019-06-01 (×3): 400 mg via INTRAVENOUS
  Filled 2019-05-31 (×3): qty 200

## 2019-05-31 MED ORDER — LACTATED RINGERS IV SOLN: INTRAVENOUS | Status: DC

## 2019-05-31 MED ORDER — ACETAMINOPHEN 325 MG PO TABS
650.0000 mg | ORAL_TABLET | Freq: Four times a day (QID) | ORAL | Status: DC | PRN
  Administered 2019-06-01 – 2019-06-03 (×3): 650 mg via ORAL
  Filled 2019-05-31 (×3): qty 2

## 2019-05-31 MED ORDER — PANTOPRAZOLE SODIUM 40 MG IV SOLR
40.0000 mg | INTRAVENOUS | Status: DC
  Administered 2019-05-31 – 2019-06-01 (×2): 40 mg via INTRAVENOUS
  Filled 2019-05-31 (×3): qty 40

## 2019-05-31 MED ORDER — LABETALOL HCL 5 MG/ML IV SOLN: 10.0000 mg | INTRAVENOUS | Status: DC | PRN

## 2019-05-31 MED ORDER — MONTELUKAST SODIUM 10 MG PO TABS
10.0000 mg | ORAL_TABLET | Freq: Every day | ORAL | Status: DC
  Administered 2019-05-31 – 2019-06-02 (×3): 10 mg via ORAL
  Filled 2019-05-31 (×3): qty 1

## 2019-05-31 MED ORDER — ONDANSETRON HCL 4 MG PO TABS: 4.0000 mg | ORAL_TABLET | Freq: Four times a day (QID) | ORAL | Status: DC | PRN

## 2019-05-31 MED ORDER — ACETAMINOPHEN 650 MG RE SUPP: 650.0000 mg | Freq: Four times a day (QID) | RECTAL | Status: DC | PRN

## 2019-05-31 MED ORDER — CHLORHEXIDINE GLUCONATE CLOTH 2 % EX PADS
6.0000 | MEDICATED_PAD | Freq: Every day | CUTANEOUS | Status: DC
  Administered 2019-05-31 – 2019-06-03 (×2): 6 via TOPICAL

## 2019-05-31 MED ORDER — VITAMIN K1 10 MG/ML IJ SOLN
5.0000 mg | Freq: Once | INTRAVENOUS | Status: AC
  Administered 2019-05-31: 12:00:00 5 mg via INTRAVENOUS
  Filled 2019-05-31: qty 0.5

## 2019-05-31 MED ORDER — SODIUM CHLORIDE 0.9 % IV SOLN: Freq: Once | INTRAVENOUS | Status: AC

## 2019-05-31 MED ORDER — NEBIVOLOL HCL 2.5 MG PO TABS
5.0000 mg | ORAL_TABLET | Freq: Every day | ORAL | Status: DC
  Filled 2019-05-31 (×2): qty 1

## 2019-05-31 MED ORDER — ONDANSETRON HCL 4 MG/2ML IJ SOLN: 4.0000 mg | Freq: Four times a day (QID) | INTRAMUSCULAR | Status: DC | PRN

## 2019-05-31 MED ORDER — LISINOPRIL 10 MG PO TABS
40.0000 mg | ORAL_TABLET | Freq: Every day | ORAL | Status: DC
  Filled 2019-05-31: qty 4

## 2019-05-31 MED ORDER — METRONIDAZOLE IN NACL 5-0.79 MG/ML-% IV SOLN
500.0000 mg | Freq: Three times a day (TID) | INTRAVENOUS | Status: DC
  Administered 2019-05-31 – 2019-06-01 (×4): 500 mg via INTRAVENOUS
  Filled 2019-05-31 (×4): qty 100

## 2019-05-31 MED ORDER — LACTATED RINGERS IV BOLUS
1000.0000 mL | Freq: Once | INTRAVENOUS | Status: AC
  Administered 2019-05-31: 11:00:00 1000 mL via INTRAVENOUS

## 2019-05-31 NOTE — Consult Note (Signed)
Referring Provider: Maurilio Lovely, DO Primary Care Physician:  [name], MD Primary Gastroenterologist:  Dr. Karilyn Cota  Reason for Consultation:   Rectal bleeding.  HPI:   Patient is 69 year old Caucasian male who was in usual state of health until this morning when he woke up around 5:00 and was somewhat diaphoretic.  He felt like he did have a bowel movement and he passed large amount of blood per rectum.  He had few more bowel movements.  He did not feel weak dizzy or lightheaded.  He actually took a shower and drove to emergency room.  In the emergency room he has had few more bowel movements and has been passing burgundy blood.  He did feel lightheaded on one occasion.  He has not had any nausea or vomiting.  He says his bowels been irregular over the last few weeks.  He has had normal stools and at times loose.  He did notice lower abdominal discomfort yesterday as if he had upset stomach.  He did not have fever or chills.  Presently he has very mild discomfort in left lower quadrant of his abdomen.  Patient is anticoagulated chronically.  He is on warfarin.  His INR was 2.4.  He has been on warfarin since 2000 and never has had bleeding.  He has a good appetite and his weight has been stable.  He does not take aspirin or other OTC NSAIDs.  He may take Tylenol on as-needed basis for headache or musculoskeletal pain. His hemoglobin was 15.4 g.  Repeat hemoglobin is pending.  BUN was mildly elevated at 28.  Serum creatinine was 1.13. Patient is getting ready to receive vitamin K 5 mg IV.  He has been typed and screened. His last colonoscopy was in December 2012 revealing pancolonic diverticulosis.  Also had EGD at the time of colonoscopy revealing erosive reflux esophagitis and moderate-sized sliding hiatal hernia.  He has remained on pantoprazole with control of his heartburn.   Past Medical History:  Diagnosis Date  . ALLERGIC RHINITIS   . H/O mitral valve replacement    10 yrs ago  .  HYPERLIPIDEMIA   . Hypertension   . Long term current use of anticoagulant   . MITRAL VALVE PROLAPSE   . PREMATURE VENTRICULAR CONTRACTIONS     Past Surgical History:  Procedure Laterality Date  . COLONOSCOPY N/A 03/29/2014   Procedure: COLONOSCOPY;  Surgeon: Malissa Hippo, MD;  Location: [facility];  Service: Endoscopy;  Laterality: N/A;  855  . ESOPHAGOGASTRODUODENOSCOPY N/A 03/29/2014   Procedure: ESOPHAGOGASTRODUODENOSCOPY (EGD);  Surgeon: Malissa Hippo, MD;  Location: [facility];  Service: Endoscopy;  Laterality: N/A;  . MITRAL VALVE REPLACEMENT    . SHOULDER SURGERY      Prior to Admission medications   Medication Sig Start Date End Date Taking? Authorizing Provider  cetirizine (ZYRTEC) 10 MG tablet Take 1 tablet by mouth daily.   Yes [provider]  ezetimibe-simvastatin (VYTORIN) 10-20 MG tablet Take 1 tablet by mouth daily.   Yes [provider]  guaiFENesin (MUCINEX) 600 MG 12 hr tablet Take 600 mg by mouth 2 (two) times daily.   Yes [provider]  icosapent Ethyl (VASCEPA) 1 g capsule Take 1 capsule by mouth 2 (two) times daily. 05/02/19  Yes [provider]  lisinopril (PRINIVIL,ZESTRIL) 40 MG tablet Take 40 mg by mouth daily. Reported on 07/14/2015   Yes [provider]  montelukast (SINGULAIR) 10 MG tablet Take 10 mg by mouth  at bedtime.   Yes [provider]  nebivolol (BYSTOLIC) 5 MG tablet Take 5 mg by mouth daily.   Yes [provider]  pantoprazole (PROTONIX) 40 MG tablet TAKE 1 TABLET BY MOUTH EVERY MORNING 10/31/18  Yes Setzer, Terri L, NP  warfarin (COUMADIN) 5 MG tablet TAKE AS DIRECTED BY COUMADIN CLINIC 04/06/19  Yes Josue Hector, MD    Current Facility-Administered Medications  Medication Dose Route Frequency Provider Last Rate Last Admin  . acetaminophen (TYLENOL) tablet 650 mg  650 mg Oral Q6H PRN Manuella Ghazi, Pratik D, DO       Or  . acetaminophen (TYLENOL) suppository 650 mg  650 mg  Rectal Q6H PRN Manuella Ghazi, Pratik D, DO      . labetalol (NORMODYNE) injection 10 mg  10 mg Intravenous Q2H PRN Manuella Ghazi, Pratik D, DO      . lactated ringers infusion   Intravenous Continuous Manuella Ghazi, Pratik D, DO   Stopped at 05/31/19 1043  . montelukast (SINGULAIR) tablet 10 mg  10 mg Oral QHS Shah, Pratik D, DO      . ondansetron (ZOFRAN) tablet 4 mg  4 mg Oral Q6H PRN Manuella Ghazi, Pratik D, DO       Or  . ondansetron (ZOFRAN) injection 4 mg  4 mg Intravenous Q6H PRN Manuella Ghazi, Pratik D, DO      . pantoprazole (PROTONIX) injection 40 mg  40 mg Intravenous Q24H Shah, Pratik D, DO   40 mg at 05/31/19 0755  . phytonadione (VITAMIN K) 5 mg in dextrose 5 % 50 mL IVPB  5 mg Intravenous Once Heath Lark D, DO 50 mL/hr at 05/31/19 1202 5 mg at 05/31/19 1202   Current Outpatient Medications  Medication Sig Dispense Refill  . cetirizine (ZYRTEC) 10 MG tablet Take 1 tablet by mouth daily.    Marland Kitchen ezetimibe-simvastatin (VYTORIN) 10-20 MG tablet Take 1 tablet by mouth daily.    Marland Kitchen guaiFENesin (MUCINEX) 600 MG 12 hr tablet Take 600 mg by mouth 2 (two) times daily.    Marland Kitchen icosapent Ethyl (VASCEPA) 1 g capsule Take 1 capsule by mouth 2 (two) times daily.    Marland Kitchen lisinopril (PRINIVIL,ZESTRIL) 40 MG tablet Take 40 mg by mouth daily. Reported on 07/14/2015    . montelukast (SINGULAIR) 10 MG tablet Take 10 mg by mouth at bedtime.    . nebivolol (BYSTOLIC) 5 MG tablet Take 5 mg by mouth daily.    . pantoprazole (PROTONIX) 40 MG tablet TAKE 1 TABLET BY MOUTH EVERY MORNING 90 tablet 3  . warfarin (COUMADIN) 5 MG tablet TAKE AS DIRECTED BY COUMADIN CLINIC 30 tablet 3    Allergies as of 05/31/2019 - Review Complete 05/31/2019  Allergen Reaction Noted  . Amoxicillin  03/29/2014  . Erythromycin  02/18/2012  . Sulfonamide derivatives      History reviewed. No pertinent family history.  Social History   Socioeconomic History  . Marital status: Married    Spouse name: Not on file  . Number of children: Not on file  . Years of education:  Not on file  . Highest education level: Not on file  Occupational History  . Not on file  Tobacco Use  . Smoking status: Never Smoker  . Smokeless tobacco: Never Used  Substance and Sexual Activity  . Alcohol use: Yes    Comment: occ  . Drug use: No  . Sexual activity: Not on file  Other Topics Concern  . Not on file  Social History Narrative  . Not  on file   Social Determinants of Health   Financial Resource Strain:   . Difficulty of Paying Living Expenses: Not on file  Food Insecurity:   . Worried About Programme researcher, broadcasting/film/video in the Last Year: Not on file  . Ran Out of Food in the Last Year: Not on file  Transportation Needs:   . Lack of Transportation (Medical): Not on file  . Lack of Transportation (Non-Medical): Not on file  Physical Activity:   . Days of Exercise per Week: Not on file  . Minutes of Exercise per Session: Not on file  Stress:   . Feeling of Stress : Not on file  Social Connections:   . Frequency of Communication with Friends and Family: Not on file  . Frequency of Social Gatherings with Friends and Family: Not on file  . Attends Religious Services: Not on file  . Active Member of Clubs or Organizations: Not on file  . Attends Banker Meetings: Not on file  . Marital Status: Not on file  Intimate Partner Violence:   . Fear of Current or Ex-Partner: Not on file  . Emotionally Abused: Not on file  . Physically Abused: Not on file  . Sexually Abused: Not on file    Review of Systems: See HPI, otherwise normal ROS  Physical Exam: Temp:  [98.2 F (36.8 C)] 98.2 F (36.8 C) (02/11 0615) Pulse Rate:  [73-85] 76 (02/11 1205) Resp:  [10-26] 14 (02/11 1205) BP: (99-176)/(72-119) 120/95 (02/11 1205) SpO2:  [93 %-100 %] 99 % (02/11 1205)   Patient was seen in emergency room. He is alert and in no acute distress. Conjunctiva is pink.  Sclera is nonicteric. Oropharyngeal mucosa is normal. Neck without masses or thyromegaly. Cardiac exam  reveals regular rhythm normal S1 with prominent S2.  No murmur gallop noted. Auscultation lungs reveal vesicular breath sounds bilaterally. Abdomen is symmetrical bowel sounds are normal.  On palpation abdomen is soft with mild tenderness at LLQ.  No organomegaly or masses. He has hernia in left inguinal region which is nontender and completely reducible. He does not have peripheral edema or clubbing. Lab Results: Recent Labs    05/31/19 0609 05/31/19 1133  WBC 7.4  --   HGB 15.4 13.5  HCT 49.0 42.7  PLT 271  --    BMET Recent Labs    05/31/19 0609  NA 141  K 3.8  CL 108  CO2 28  GLUCOSE 134*  BUN 28*  CREATININE 1.13  CALCIUM 9.3   LFT No results for input(s): PROT, ALBUMIN, AST, ALT, ALKPHOS, BILITOT, BILIDIR, IBILI in the last 72 hours. PT/INR Recent Labs    05/31/19 0609  LABPROT 25.7*  INR 2.4*   Hepatitis Panel No results for input(s): HEPBSAG, HCVAB, HEPAIGM, HEPBIGM in the last 72 hours.  Studies/Results: No results found.  Assessment;  Patient is 69 year old Caucasian male who presents with large volume painless hematochezia.  He has a history of colonic diverticulosis based on colonoscopy of December 2015.  Patient is chronically anticoagulated.  He is on warfarin and INR is therapeutic.  Hemoglobin from this afternoon is pending.  Admission hemoglobin was 15.4. Patient is scheduled to receive vitamin K IV. Suspect colonic diverticular bleed.  He is hemodynamically stable. If he continues to bleed would consider CT angio abdomen and pelvis today otherwise plan for colonoscopy on 06/01/2019.  Hopefully his INR would be low 2. He has mild tenderness at LLQ.  Therefore I am concerned that he  may have diverticulitis.  Therefore we will start him on antibiotic.  History of erosive reflux esophagitis.  He is on pantoprazole.  Doubt upper GI bleed with rapid transit time.  Recommendations;  Clear liquids. Cipro 400 mg IV every 12 hours. Metronidazole 500 mg  IV every 8 hours. Possible colonoscopy on 06/01/2019.   LOS: 0 days   Rebel Willcutt  05/31/2019, 12:27 PM

## 2019-05-31 NOTE — ED Notes (Signed)
Pharmacy bringing down Vitamin K

## 2019-05-31 NOTE — Progress Notes (Signed)
Patient has not passed any more blood per rectum in the last 3 hours. Vital signs remained stable. Hemoglobin has dropped from 15.4g to 12.7 g. Repeat lab in a.m. and possible colonoscopy.

## 2019-05-31 NOTE — ED Notes (Signed)
Pt had grossly bloody stool, EDP made aware and seen sample.

## 2019-05-31 NOTE — ED Provider Notes (Signed)
[facility] EMERGENCY DEPARTMENT Provider Note   CSN: [id number] Arrival date & time: 05/31/19  0545   History Chief Complaint  Patient presents with  . Rectal Bleeding    Santina Evans, PhD is a 69 y.o. male.  The history is provided by the patient.  He has history of hypertension, hyperlipidemia, mitral valve replacement and is chronically anticoagulated on warfarin and comes in after an episode of rectal bleeding.  He states that at about 5:15 AM, he had diarrhea which consisted of a large amount of dark red blood.  He had some minimal lower abdominal discomfort before going to bed but no true abdominal pain.  He denies any nausea or vomiting.  He was mildly diaphoretic but not denies dizziness, lightheadedness, weakness.  He denies taking any aspirin or NSAIDs.  Past Medical History:  Diagnosis Date  . ALLERGIC RHINITIS   . H/O mitral valve replacement    10 yrs ago  . HYPERLIPIDEMIA   . Hypertension   . Long term current use of anticoagulant   . MITRAL VALVE PROLAPSE   . PREMATURE VENTRICULAR CONTRACTIONS     Patient Active Problem List   Diagnosis Date Noted  . Rectal bleeding 03/07/2014  . Melena 03/07/2014  . Vertigo 03/02/2012  . History of mitral valve replacement 07/17/2010  . Long term current use of anticoagulant 07/17/2010  . AORTIC VALVE REPLACEMENT, HX OF 09/23/2008  . PREMATURE VENTRICULAR CONTRACTIONS 08/07/2008  . HYPERLIPIDEMIA 09/05/2006  . HYPERTENSION 03/28/2006  . MITRAL VALVE PROLAPSE 03/28/2006  . ALLERGIC RHINITIS 03/28/2006    Past Surgical History:  Procedure Laterality Date  . COLONOSCOPY N/A 03/29/2014   Procedure: COLONOSCOPY;  Surgeon: Rogene Houston, MD;  Location: [facility];  Service: Endoscopy;  Laterality: N/A;  855  . ESOPHAGOGASTRODUODENOSCOPY N/A 03/29/2014   Procedure: ESOPHAGOGASTRODUODENOSCOPY (EGD);  Surgeon: Rogene Houston, MD;  Location: [facility];  Service: Endoscopy;  Laterality: N/A;  . MITRAL VALVE  REPLACEMENT    . SHOULDER SURGERY         No family history on file.  Social History   Tobacco Use  . Smoking status: Never Smoker  . Smokeless tobacco: Never Used  Substance Use Topics  . Alcohol use: Yes    Comment: occ  . Drug use: No    Home Medications Prior to Admission medications   Medication Sig Start Date End Date Taking? Authorizing Provider  ezetimibe-simvastatin (VYTORIN) 10-20 MG tablet Take 1 tablet by mouth daily.    [provider]  lisinopril (PRINIVIL,ZESTRIL) 40 MG tablet Take 40 mg by mouth daily. Reported on 07/14/2015    [provider]  montelukast (SINGULAIR) 10 MG tablet Take 10 mg by mouth at bedtime.    [provider]  nebivolol (BYSTOLIC) 5 MG tablet Take 5 mg by mouth daily.    [provider]  pantoprazole (PROTONIX) 40 MG tablet TAKE 1 TABLET BY MOUTH EVERY MORNING 10/31/18   Setzer, Rona Ravens, NP  warfarin (COUMADIN) 5 MG tablet TAKE AS DIRECTED BY COUMADIN CLINIC 04/06/19   Josue Hector, MD    Allergies    Amoxicillin, Erythromycin, and Sulfonamide derivatives  Review of Systems   Review of Systems  All other systems reviewed and are negative.   Physical Exam Updated Vital Signs BP (!) 173/119 (BP Location: Right Arm)   Pulse 83   Resp 18   SpO2 100%   Physical Exam Vitals and nursing note reviewed.   69 year old male, resting  comfortably and in no acute distress. Vital signs are significant for elevated blood pressure. Oxygen saturation is 100%, which is normal. Head is normocephalic and atraumatic. PERRLA, EOMI. Oropharynx is clear. Neck is nontender and supple without adenopathy or JVD. Back is nontender and there is no CVA tenderness. Lungs are clear without rales, wheezes, or rhonchi. Chest is nontender. Heart has regular rate and rhythm without murmur. Abdomen is soft, flat, nontender without masses or hepatosplenomegaly and peristalsis is normoactive. Extremities have no cyanosis or  edema, full range of motion is present. Skin is warm and dry without rash. Neurologic: Mental status is normal, cranial nerves are intact, there are no motor or sensory deficits.  ED Results / Procedures / Treatments   Labs (all labs ordered are listed, but only abnormal results are displayed) Labs Reviewed - No data to display    EKG Interpretation  Date/Time:  Thursday May 31 2019 06:22:39 EST Ventricular Rate:  77 PR Interval:    QRS Duration: 118 QT Interval:  406 QTC Calculation: 460 R Axis:   -59 Text Interpretation: Sinus rhythm Left anterior fascicular block When compared with ECG of 02/18/2012, No significant change was found Confirmed by Dione Booze ([id number]) on 05/31/2019 6:25:55 AM      Procedures Procedures   Medications Ordered in ED Medications - No data to display  ED Course  I have reviewed the triage vital signs and the nursing notes.  Pertinent lab results that were available during my care of the patient were reviewed by me and considered in my medical decision making (see chart for details).  MDM Rules/Calculators/A&P Rectal bleeding in patient who is anticoagulated on warfarin.  Old records are reviewed, and in December 2015 he had upper endoscopy and colonoscopy to evaluate an episode of rectal bleeding.  Findings at that time were mild gastric irritation and diverticulosis without obvious bleeding site.  Will check screening labs including INR, and orthostatic vital signs.  Of note, he did bring with him a piece of toilet paper from home with bright red blood on it.  At this point, most likely source of bleeding is diverticulosis, but need to consider AVM, gastritis, peptic ulcers.  Last INR on record was 2.5 on January 19.  Orthostatic vital signs show no significant change in blood pressure or pulse.  Patient does relate that he feels that he is going to have another bowel movement.  Labs are pending.    He passed a large amount of dark red blood with  clots.  He will need to be admitted once labs are back.  Case is signed out to Dr. Effie Shy.  Final Clinical Impression(s) / ED Diagnoses Final diagnoses:  Rectal bleeding  Anticoagulated on warfarin  Elevated blood pressure reading with diagnosis of hypertension    Rx / DC Orders ED Discharge Orders    None       Dione Booze, MD 05/31/19 [phone]

## 2019-05-31 NOTE — ED Triage Notes (Signed)
Pt states he got up this am and had 3 episodes of bright red bloody diarrhea; pt states he felt "clammy"; pt states he had some lower abdominal pain last night before he went to bed

## 2019-05-31 NOTE — H&P (Signed)
History and Physical    Luis Foushee, PhD IDP:[id number] DOB: Mar 23, 1951 DOA: 05/31/2019  PCP: Celene Squibb, MD   Patient coming from: Home  Chief Complaint: Rectal bleeding  HPI: Luis Evans, PhD is a 69 y.o. male with medical history significant for history of mechanical mitral valve placement on chronic anticoagulation with Coumadin, dyslipidemia, hypertension, allergic rhinitis, erosive reflux esophagitis, erosive gastritis, pancolonic diverticulosis, and internal hemorrhoids with last EGD and colonoscopy on 03/2014.  He presented to the emergency department earlier this morning after he had an episode of rectal bleeding and diarrhea at approximately 5:15 AM.  He noticed a lot of dark red blood in the toilet bowl and had lower abdominal discomfort that he noticed prior to going to bed that he claims felt more like "indigestion."  He denies any nausea or vomiting, fevers, or chills.  He was mildly diaphoretic this morning, but did not have any other weakness or lightheadedness or dizziness.  He took his last dose of Coumadin last night and denies taking any aspirin or NSAIDs.   ED Course: Vital signs were noted to be stable and he is actually somewhat hypertensive.  His orthostatic vitals are within normal limits.  Over the course of his ED stay, he did start having frequent, bloody bowel movements and he has had 3-4 episodes thus far with large amounts of blood in the toilet bowl.  His INR is 2.4.  His hemoglobin is 15.4 and hematocrit is 49.  Platelets are 271.  He is starting to have a drop in his blood pressure readings and he is developing some weakness.  I will start him on IV Protonix and give IV fluid bolus and recheck H&H with plans for transfusion as needed.  Vitamin K IV will be given for INR reversal.  GI consulted for further evaluation.  Review of Systems: All others reviewed as noted above and otherwise negative.  Past Medical History:  Diagnosis Date  . ALLERGIC  RHINITIS   . H/O mitral valve replacement    10 yrs ago  . HYPERLIPIDEMIA   . Hypertension   . Long term current use of anticoagulant   . MITRAL VALVE PROLAPSE   . PREMATURE VENTRICULAR CONTRACTIONS     Past Surgical History:  Procedure Laterality Date  . COLONOSCOPY N/A 03/29/2014   Procedure: COLONOSCOPY;  Surgeon: Rogene Houston, MD;  Location: [facility];  Service: Endoscopy;  Laterality: N/A;  855  . ESOPHAGOGASTRODUODENOSCOPY N/A 03/29/2014   Procedure: ESOPHAGOGASTRODUODENOSCOPY (EGD);  Surgeon: Rogene Houston, MD;  Location: [facility];  Service: Endoscopy;  Laterality: N/A;  . MITRAL VALVE REPLACEMENT    . SHOULDER SURGERY       reports that he has never smoked. He has never used smokeless tobacco. He reports current alcohol use. He reports that he does not use drugs.  Allergies  Allergen Reactions  . Amoxicillin     GI upset  . Erythromycin   . Sulfonamide Derivatives     History reviewed. No pertinent family history.  Prior to Admission medications   Medication Sig Start Date End Date Taking? Authorizing Provider  cetirizine (ZYRTEC) 10 MG tablet Take 1 tablet by mouth daily.   Yes [provider]  ezetimibe-simvastatin (VYTORIN) 10-20 MG tablet Take 1 tablet by mouth daily.   Yes [provider]  guaiFENesin (MUCINEX) 600 MG 12 hr tablet Take 600 mg by mouth 2 (two) times daily.   Yes [provider]  icosapent Ethyl (VASCEPA) 1 g  capsule Take 1 capsule by mouth 2 (two) times daily. 05/02/19  Yes [provider]  lisinopril (PRINIVIL,ZESTRIL) 40 MG tablet Take 40 mg by mouth daily. Reported on 07/14/2015   Yes [provider]  montelukast (SINGULAIR) 10 MG tablet Take 10 mg by mouth at bedtime.   Yes [provider]  nebivolol (BYSTOLIC) 5 MG tablet Take 5 mg by mouth daily.   Yes [provider]  pantoprazole (PROTONIX) 40 MG tablet TAKE 1 TABLET BY MOUTH EVERY MORNING 10/31/18  Yes Setzer, Brand Males, NP  warfarin (COUMADIN) 5 MG tablet TAKE AS DIRECTED BY COUMADIN CLINIC 04/06/19  Yes [name], MD    Physical Exam: Vitals:   05/31/19 0800 05/31/19 0830 05/31/19 0900 05/31/19 1008  BP: 128/88 118/88 (!) 131/95 99/72  Pulse: 80 75 85 81  Resp: 12 11 10 12   Temp:      TempSrc:      SpO2: 95% 93% 97% 97%    Constitutional: NAD, calm, comfortable Vitals:   05/31/19 0800 05/31/19 0830 05/31/19 0900 05/31/19 1008  BP: 128/88 118/88 (!) 131/95 99/72  Pulse: 80 75 85 81  Resp: 12 11 10 12   Temp:      TempSrc:      SpO2: 95% 93% 97% 97%   Eyes: lids and conjunctivae normal ENMT: Mucous membranes are moist.  Neck: normal, supple Respiratory: clear to auscultation bilaterally. Normal respiratory effort. No accessory muscle use.  Cardiovascular: Regular rate and rhythm, no murmurs. No extremity edema. Abdomen: no tenderness, no distention. Bowel sounds positive.  Musculoskeletal:  No joint deformity upper and lower extremities.   Skin: no rashes, lesions, ulcers.  Psychiatric: Normal judgment and insight. Alert and oriented x 3. Normal mood.   Labs on Admission: I have personally reviewed following labs and imaging studies  CBC: Recent Labs  Lab 05/31/19 0609  WBC 7.4  NEUTROABS 3.8  HGB 15.4  HCT 49.0  MCV 94.0  PLT 271   Basic Metabolic Panel: Recent Labs  Lab 05/31/19 0609  NA 141  K 3.8  CL 108  CO2 28  GLUCOSE 134*  BUN 28*  CREATININE 1.13  CALCIUM 9.3   GFR: CrCl cannot be calculated (Unknown ideal weight.). Liver Function Tests: No results for input(s): AST, ALT, ALKPHOS, BILITOT, PROT, ALBUMIN in the last 168 hours. No results for input(s): LIPASE, AMYLASE in the last 168 hours. No results for input(s): AMMONIA in the last 168 hours. Coagulation Profile: Recent Labs  Lab 05/31/19 0609  INR 2.4*   Cardiac Enzymes: No results for input(s): CKTOTAL, CKMB, CKMBINDEX, TROPONINI in the last 168 hours. BNP (last 3 results) No results for  input(s): PROBNP in the last 8760 hours. HbA1C: No results for input(s): HGBA1C in the last 72 hours. CBG: No results for input(s): GLUCAP in the last 168 hours. Lipid Profile: No results for input(s): CHOL, HDL, LDLCALC, TRIG, CHOLHDL, LDLDIRECT in the last 72 hours. Thyroid Function Tests: No results for input(s): TSH, T4TOTAL, FREET4, T3FREE, THYROIDAB in the last 72 hours. Anemia Panel: No results for input(s): VITAMINB12, FOLATE, FERRITIN, TIBC, IRON, RETICCTPCT in the last 72 hours. Urine analysis:    Component Value Date/Time   COLORURINE yellow 10/25/2007 0859   APPEARANCEUR Clear 10/25/2007 0859   LABSPEC 1.025 10/25/2007 0859   PHURINE 5.0 10/25/2007 0859   HGBUR trace-lysed 10/25/2007 0859   BILIRUBINUR negative 10/25/2007 0859   UROBILINOGEN 0.2 10/25/2007 0859   NITRITE negative 10/25/2007 0859    Radiological Exams  on Admission: No results found.  EKG: Independently reviewed.  Sinus rhythm 77 bpm with LAFB.  Assessment/Plan Active Problems:   Lower GI bleed    Acute rectal bleed in the setting of chronic anticoagulation with Coumadin -Suspect that this is a diverticular bleed as patient has had prior colonoscopy with pandiverticulosis noted.  He is also noted to have internal hemorrhoids. -Continue monitor closely in stepdown unit and transfuse as needed, repeat H&H -Hold Coumadin -INR reversal with vitamin K 5 mg IV x1 dose now -Avoid heparin agents -N.p.o. for now  Hypotension secondary to above -Hold home antihypertensives -Fluid bolus x1 L now -Monitor closely in stepdown unit and bolus further/transfuse as needed  History of hypertension -Hold home blood pressure medications  History of dyslipidemia -Hold Vytorin  History of GERD/gastritis/erosive esophagitis -PPI IV daily  History of allergic rhinitis -Maintain on Singulair   DVT prophylaxis: SCDs Code Status: Full Family Communication: None at bedside Disposition Plan: Monitor for  ongoing bleeding and transfuse as needed.  Appreciate GI evaluation for possible endoscopy. Consults called:GI Admission status: Obs, SDU   Srinidhi Landers D Audie Wieser DO [facility]  If 7PM-7AM, please contact night-coverage [email or web address]  05/31/2019, 11:01 AM

## 2019-05-31 NOTE — ED Notes (Signed)
Pharmacy has not verified medications. Will administer once verified.

## 2019-06-01 ENCOUNTER — Encounter (HOSPITAL_COMMUNITY)
Admission: EM | Disposition: A | Discharge status: Home / Self Care | Payer: Self-pay | Source: Home / Self Care | Attending: Internal Medicine

## 2019-06-01 DIAGNOSIS — Z882 Allergy status to sulfonamides status: Secondary | ICD-10-CM | POA: Diagnosis not present

## 2019-06-01 DIAGNOSIS — E785 Hyperlipidemia, unspecified: Secondary | ICD-10-CM | POA: Diagnosis present

## 2019-06-01 DIAGNOSIS — J309 Allergic rhinitis, unspecified: Secondary | ICD-10-CM | POA: Diagnosis present

## 2019-06-01 DIAGNOSIS — K922 Gastrointestinal hemorrhage, unspecified: Secondary | ICD-10-CM | POA: Diagnosis not present

## 2019-06-01 DIAGNOSIS — Z7901 Long term (current) use of anticoagulants: Secondary | ICD-10-CM | POA: Diagnosis not present

## 2019-06-01 DIAGNOSIS — Z20822 Contact with and (suspected) exposure to covid-19: Secondary | ICD-10-CM | POA: Diagnosis present

## 2019-06-01 DIAGNOSIS — K625 Hemorrhage of anus and rectum: Secondary | ICD-10-CM | POA: Diagnosis present

## 2019-06-01 DIAGNOSIS — Z952 Presence of prosthetic heart valve: Secondary | ICD-10-CM | POA: Diagnosis not present

## 2019-06-01 DIAGNOSIS — D62 Acute posthemorrhagic anemia: Secondary | ICD-10-CM | POA: Diagnosis not present

## 2019-06-01 DIAGNOSIS — K644 Residual hemorrhoidal skin tags: Secondary | ICD-10-CM

## 2019-06-01 DIAGNOSIS — Z8719 Personal history of other diseases of the digestive system: Secondary | ICD-10-CM | POA: Diagnosis not present

## 2019-06-01 DIAGNOSIS — K5730 Diverticulosis of large intestine without perforation or abscess without bleeding: Secondary | ICD-10-CM

## 2019-06-01 DIAGNOSIS — Z888 Allergy status to other drugs, medicaments and biological substances status: Secondary | ICD-10-CM | POA: Diagnosis not present

## 2019-06-01 DIAGNOSIS — K648 Other hemorrhoids: Secondary | ICD-10-CM | POA: Diagnosis present

## 2019-06-01 DIAGNOSIS — Z880 Allergy status to penicillin: Secondary | ICD-10-CM | POA: Diagnosis not present

## 2019-06-01 DIAGNOSIS — K449 Diaphragmatic hernia without obstruction or gangrene: Secondary | ICD-10-CM | POA: Diagnosis present

## 2019-06-01 DIAGNOSIS — Z79899 Other long term (current) drug therapy: Secondary | ICD-10-CM | POA: Diagnosis not present

## 2019-06-01 DIAGNOSIS — I10 Essential (primary) hypertension: Secondary | ICD-10-CM | POA: Diagnosis present

## 2019-06-01 DIAGNOSIS — K5731 Diverticulosis of large intestine without perforation or abscess with bleeding: Secondary | ICD-10-CM | POA: Diagnosis present

## 2019-06-01 DIAGNOSIS — K2100 Gastro-esophageal reflux disease with esophagitis, without bleeding: Secondary | ICD-10-CM | POA: Diagnosis present

## 2019-06-01 HISTORY — PX: COLONOSCOPY: SHX5424

## 2019-06-01 LAB — CBC
HCT: 35.3 % — ABNORMAL LOW (ref 39.0–52.0)
Hemoglobin: 11.3 g/dL — ABNORMAL LOW (ref 13.0–17.0)
MCH: 29.9 pg (ref 26.0–34.0)
MCHC: 32 g/dL (ref 30.0–36.0)
MCV: 93.4 fL (ref 80.0–100.0)
Platelets: 225 10*3/uL (ref 150–400)
RBC: 3.78 MIL/uL — ABNORMAL LOW (ref 4.22–5.81)
RDW: 13.4 % (ref 11.5–15.5)
WBC: 8.1 10*3/uL (ref 4.0–10.5)
nRBC: 0 % (ref 0.0–0.2)

## 2019-06-01 LAB — COMPREHENSIVE METABOLIC PANEL
ALT: 15 U/L (ref 0–44)
AST: 16 U/L (ref 15–41)
Albumin: 3.2 g/dL — ABNORMAL LOW (ref 3.5–5.0)
Alkaline Phosphatase: 27 U/L — ABNORMAL LOW (ref 38–126)
Anion gap: 8 (ref 5–15)
BUN: 19 mg/dL (ref 8–23)
CO2: 25 mmol/L (ref 22–32)
Calcium: 8.4 mg/dL — ABNORMAL LOW (ref 8.9–10.3)
Chloride: 105 mmol/L (ref 98–111)
Creatinine, Ser: 0.89 mg/dL (ref 0.61–1.24)
GFR calc Af Amer: >60 mL/min (ref >60)
GFR calc non Af Amer: >60 mL/min (ref >60)
Glucose, Bld: 135 mg/dL — ABNORMAL HIGH (ref 70–99)
Potassium: 3.9 mmol/L (ref 3.5–5.1)
Sodium: 138 mmol/L (ref 135–145)
Total Bilirubin: 0.7 mg/dL (ref 0.3–1.2)
Total Protein: 5.4 g/dL — ABNORMAL LOW (ref 6.5–8.1)

## 2019-06-01 LAB — PROTIME-INR
INR: 1.5 — ABNORMAL HIGH (ref 0.8–1.2)
Prothrombin Time: 17.6 seconds — ABNORMAL HIGH (ref 11.4–15.2)

## 2019-06-01 SURGERY — COLONOSCOPY
Anesthesia: Moderate Sedation

## 2019-06-01 MED ORDER — MEPERIDINE HCL 50 MG/ML IJ SOLN
INTRAMUSCULAR | Status: DC | PRN
  Administered 2019-06-01 (×2): 25 mg via INTRAVENOUS

## 2019-06-01 MED ORDER — WARFARIN SODIUM 5 MG PO TABS: 5.0000 mg | ORAL_TABLET | Freq: Every day | ORAL | Status: DC

## 2019-06-01 MED ORDER — MEPERIDINE HCL 50 MG/ML IJ SOLN
INTRAMUSCULAR | Status: AC
  Filled 2019-06-01: qty 1

## 2019-06-01 MED ORDER — MIDAZOLAM HCL 5 MG/5ML IJ SOLN
INTRAMUSCULAR | Status: DC | PRN
  Administered 2019-06-01: 2 mg via INTRAVENOUS
  Administered 2019-06-01 (×2): 1 mg via INTRAVENOUS
  Administered 2019-06-01: 2 mg via INTRAVENOUS

## 2019-06-01 MED ORDER — WARFARIN SODIUM 7.5 MG PO TABS
7.5000 mg | ORAL_TABLET | Freq: Once | ORAL | Status: AC
  Administered 2019-06-01: 18:00:00 7.5 mg via ORAL
  Filled 2019-06-01: qty 1

## 2019-06-01 MED ORDER — SODIUM CHLORIDE 0.9 % IV SOLN
INTRAVENOUS | Status: DC
  Administered 2019-06-01: 14:00:00 1000 mL via INTRAVENOUS

## 2019-06-01 MED ORDER — STERILE WATER FOR IRRIGATION IR SOLN
Status: DC | PRN
  Administered 2019-06-01: 1.5 mL

## 2019-06-01 MED ORDER — HEPARIN (PORCINE) 25000 UT/250ML-% IV SOLN
1300.0000 [IU]/h | INTRAVENOUS | Status: DC
  Administered 2019-06-01 – 2019-06-02 (×2): 1500 [IU]/h via INTRAVENOUS
  Administered 2019-06-03: 06:00:00 1300 [IU]/h via INTRAVENOUS
  Filled 2019-06-01 (×3): qty 250

## 2019-06-01 MED ORDER — MIDAZOLAM HCL 5 MG/5ML IJ SOLN
INTRAMUSCULAR | Status: AC
  Filled 2019-06-01: qty 10

## 2019-06-01 MED ORDER — WARFARIN - PHARMACIST DOSING INPATIENT: Freq: Every day | Status: DC

## 2019-06-01 MED ORDER — HEPARIN BOLUS VIA INFUSION
2000.0000 [IU] | Freq: Once | INTRAVENOUS | Status: AC
  Administered 2019-06-01: 19:00:00 2000 [IU] via INTRAVENOUS
  Filled 2019-06-01: qty 2000

## 2019-06-01 MED ORDER — PEG 3350-KCL-NA BICARB-NACL 420 G PO SOLR
4000.0000 mL | Freq: Once | ORAL | Status: AC
  Administered 2019-06-01: 08:00:00 4000 mL via ORAL

## 2019-06-01 NOTE — Op Note (Signed)
[facility] Patient Name: Luis Haynes Procedure Date: 06/01/2019 1:53 PM MRN: [id number] Date of Birth: [birth year] Attending MD: Lionel December , MD CSN: [id number] Age: 69 Admit Type: Inpatient Procedure:                Colonoscopy Indications:              Rectal bleeding Providers:                Lionel December, MD, Criselda Peaches. Patsy Lager, RN, Burke Keels, Technician Referring MD:             Maurilio Lovely, DO Medicines:                Meperidine 50 mg IV, Midazolam 6 mg IV Complications:            No immediate complications. Estimated Blood Loss:     Estimated blood loss: none. Procedure:                Pre-Anesthesia Assessment:                           - Prior to the procedure, a History and Physical                            was performed, and patient medications and                            allergies were reviewed. The patient's tolerance of                            previous anesthesia was also reviewed. The risks                            and benefits of the procedure and the sedation                            options and risks were discussed with the patient.                            All questions were answered, and informed consent                            was obtained. Prior Anticoagulants: The patient                            last took Coumadin (warfarin) 2 days prior to the                            procedure. ASA Grade Assessment: III - A patient                            with severe systemic disease. After reviewing the  risks and benefits, the patient was deemed in                            satisfactory condition to undergo the procedure.                           After obtaining informed consent, the colonoscope                            was passed under direct vision. Throughout the                            procedure, the patient's blood pressure, pulse, and   oxygen saturations were monitored continuously. The                            [id number]-[id number] ([id number]) scope was introduced through                            the anus and advanced to the the terminal ileum,                            with identification of the appendiceal orifice and                            IC valve. The colonoscopy was performed without                            difficulty. The patient tolerated the procedure                            well. The quality of the bowel preparation was                            good. The terminal ileum, ileocecal valve,                            appendiceal orifice, and rectum were photographed. Scope In: 2:08:19 PM Scope Out: 2:30:43 PM Scope Withdrawal Time: 0 hours 9 minutes 1 second  Total Procedure Duration: 0 hours 22 minutes 24 seconds  Findings:      The perianal and digital rectal examinations were normal.      The terminal ileum appeared normal.      Multiple small and large-mouthed diverticula were found in the entire       colon.      External hemorrhoids were found during retroflexion. The hemorrhoids       were small. Impression:               - The examined portion of the ileum was normal.                           - Diverticulosis in the entire examined colon.                           - External  hemorrhoids.                           - No specimens collected. Moderate Sedation:      Moderate (conscious) sedation was administered by the endoscopy nurse       and supervised by the endoscopist. The following parameters were       monitored: oxygen saturation, heart rate, blood pressure, CO2       capnography and response to care. Total physician intraservice time was       27 minutes. Recommendation:           - Return patient to ICU for ongoing care.                           - Cardiac diet today.                           - Continue present medications.                           - Resume Coumadin (warfarin) at prior  dose today.                           - Consider CTA A/P if bleeding recurs. Procedure Code(s):        --- Professional ---                           [phone], Colonoscopy, flexible; diagnostic, including                            collection of specimen(s) by brushing or washing,                            when performed (separate procedure)                           99153, Moderate sedation; each additional 15                            minutes intraservice time                           G0500, Moderate sedation services provided by the                            same physician or other qualified health care                            professional performing a gastrointestinal                            endoscopic service that sedation supports,                            requiring the presence of an independent trained  observer to assist in the monitoring of the                            patient's level of consciousness and physiological                            status; initial 15 minutes of intra-service time;                            patient age 26 years or older (additional time may                            be reported with 19147, as appropriate) Diagnosis Code(s):        --- Professional ---                           K64.4, Residual hemorrhoidal skin tags                           K62.5, Hemorrhage of anus and rectum                           K57.30, Diverticulosis of large intestine without                            perforation or abscess without bleeding CPT copyright 2019 [employer]. All rights reserved. The codes documented in this report are preliminary and upon coder review may  be revised to meet current compliance requirements. Lionel December, MD Lionel December, MD 06/01/2019 3:24:20 PM This report has been signed electronically. Number of Addenda: 0

## 2019-06-01 NOTE — Progress Notes (Signed)
Patient has no complaints.  He states he was able to sleep for about 5 hours.  He has not passed any more blood per rectum.  He denies abdominal pain. Vital signs are stable. His abdomen is soft and nontender with no organomegaly or masses.  Hemoglobin is 11.7 g.  On presentation was 15.4 g. INR is down to 1.5.  It appears patient has stopped bleeding.  Suspect diverticular bleed. Will prep with GoLYTELY for colonoscopy under monitored anesthesia care later today.

## 2019-06-01 NOTE — Progress Notes (Signed)
Brief colonoscopy note.  Normal terminal ileum. Multiple diverticula throughout the colon but none with stigmata or active bleeding. Small external hemorrhoids.

## 2019-06-01 NOTE — Progress Notes (Signed)
ANTICOAGULATION CONSULT NOTE - Initial Consult  Pharmacy Consult for heparin gtt  Indication: atrial fibrillation  Allergies  Allergen Reactions  . Amoxicillin     GI upset  . Erythromycin   . Sulfonamide Derivatives     Patient Measurements: Height: 6\' 1"  (185.4 cm) Weight: 211 lb (95.7 kg) IBW/kg (Calculated) : 79.9 Heparin Dosing Weight: HEPARIN DW (KG): 95.7   Vital Signs: Temp: 98.5 F (36.9 C) (02/12 1553) Temp Source: Oral (02/12 1553) BP: 133/84 (02/12 1637) Pulse Rate: 86 (02/12 1637)  Labs: Recent Labs    05/31/19 0609 05/31/19 0609 05/31/19 1133 05/31/19 1133 05/31/19 1642 06/01/19 0334  HGB 15.4   < > 13.5   < > 12.7* 11.3*  HCT 49.0   < > 42.7  --  39.9 35.3*  PLT 271  --   --   --   --  225  LABPROT 25.7*  --   --   --   --  17.6*  INR 2.4*  --   --   --   --  1.5*  CREATININE 1.13  --   --   --   --  0.89   < > = values in this interval not displayed.    Estimated Creatinine Clearance: 88.5 mL/min (by C-G formula based on SCr of 0.89 mg/dL).   Medical History: Past Medical History:  Diagnosis Date  . ALLERGIC RHINITIS   . H/O mitral valve replacement    10 yrs ago  . HYPERLIPIDEMIA   . Hypertension   . Long term current use of anticoagulant   . MITRAL VALVE PROLAPSE   . PREMATURE VENTRICULAR CONTRACTIONS     Medications:  Medications Prior to Admission  Medication Sig Dispense Refill Last Dose  . cetirizine (ZYRTEC) 10 MG tablet Take 1 tablet by mouth daily.   05/30/2019 at Unknown time  . ezetimibe-simvastatin (VYTORIN) 10-20 MG tablet Take 1 tablet by mouth daily.   05/30/2019 at Unknown time  . guaiFENesin (MUCINEX) 600 MG 12 hr tablet Take 600 mg by mouth 2 (two) times daily.   05/30/2019 at Unknown time  . icosapent Ethyl (VASCEPA) 1 g capsule Take 1 capsule by mouth 2 (two) times daily.     07/28/2019 lisinopril (PRINIVIL,ZESTRIL) 40 MG tablet Take 40 mg by mouth daily. Reported on 07/14/2015   05/30/2019 at Unknown time  . montelukast  (SINGULAIR) 10 MG tablet Take 10 mg by mouth at bedtime.   05/30/2019 at Unknown time  . nebivolol (BYSTOLIC) 5 MG tablet Take 5 mg by mouth daily.   05/30/2019 at Unknown time  . pantoprazole (PROTONIX) 40 MG tablet TAKE 1 TABLET BY MOUTH EVERY MORNING 90 tablet 3 05/30/2019 at Unknown time  . warfarin (COUMADIN) 5 MG tablet TAKE AS DIRECTED BY COUMADIN CLINIC 30 tablet 3 05/30/2019 at 2100   Scheduled:  . Chlorhexidine Gluconate Cloth  6 each Topical Daily  . heparin  2,000 Units Intravenous Once  . meperidine      . midazolam      . montelukast  10 mg Oral QHS  . pantoprazole (PROTONIX) IV  40 mg Intravenous Q24H  . Warfarin - Pharmacist Dosing Inpatient   Does not apply q1800   Infusions:  . heparin    . 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 Heparin gtt will be started following pharmacy protocol per pharmacy consult. Patient is not on previous oral anticoagulant that will require aPTT/HL correlation before transitioning to only HL monitoring.   Patient INR 1.5, pharmacy dosing warfarin as well. Using a conservative heparin bolus due to elevated INR.   Goal of Therapy:  Heparin level 0.3-0.7 units/ml Monitor platelets by anticoagulation protocol: Yes   Plan:  Give 2000 units bolus x 1 Start heparin infusion at  1500 units/hr Check anti-Xa level in 6 hours and daily while on heparin Continue to monitor H&H and platelets  Heparin level to be drawn in 6 hours Harly Pipkins 06/01/2019,6:24 PM

## 2019-06-01 NOTE — Progress Notes (Signed)
ANTICOAGULATION CONSULT NOTE - Initial Consult  Pharmacy Consult for warfarin Indication: mitral valve replacement   Allergies  Allergen Reactions  . Amoxicillin     GI upset  . Erythromycin   . Sulfonamide Derivatives     Patient Measurements: Height: 6\' 1"  (185.4 cm) Weight: 211 lb (95.7 kg) IBW/kg (Calculated) : 79.9   Vital Signs: Temp: 98.8 F (37.1 C) (02/12 1329) Temp Source: Oral (02/12 1329) BP: 100/65 (02/12 1430) Pulse Rate: 95 (02/12 1430)  Labs: Recent Labs    05/31/19 0609 05/31/19 0609 05/31/19 1133 05/31/19 1133 05/31/19 1642 06/01/19 0334  HGB 15.4   < > 13.5   < > 12.7* 11.3*  HCT 49.0   < > 42.7  --  39.9 35.3*  PLT 271  --   --   --   --  225  LABPROT 25.7*  --   --   --   --  17.6*  INR 2.4*  --   --   --   --  1.5*  CREATININE 1.13  --   --   --   --  0.89   < > = values in this interval not displayed.    Estimated Creatinine Clearance: 88.5 mL/min (by C-G formula based on SCr of 0.89 mg/dL).   Medical History: Past Medical History:  Diagnosis Date  . ALLERGIC RHINITIS   . H/O mitral valve replacement    10 yrs ago  . HYPERLIPIDEMIA   . Hypertension   . Long term current use of anticoagulant   . MITRAL VALVE PROLAPSE   . PREMATURE VENTRICULAR CONTRACTIONS     Medications:  Medications Prior to Admission  Medication Sig Dispense Refill Last Dose  . cetirizine (ZYRTEC) 10 MG tablet Take 1 tablet by mouth daily.   05/30/2019 at Unknown time  . ezetimibe-simvastatin (VYTORIN) 10-20 MG tablet Take 1 tablet by mouth daily.   05/30/2019 at Unknown time  . guaiFENesin (MUCINEX) 600 MG 12 hr tablet Take 600 mg by mouth 2 (two) times daily.   05/30/2019 at Unknown time  . icosapent Ethyl (VASCEPA) 1 g capsule Take 1 capsule by mouth 2 (two) times daily.     07/28/2019 lisinopril (PRINIVIL,ZESTRIL) 40 MG tablet Take 40 mg by mouth daily. Reported on 07/14/2015   05/30/2019 at Unknown time  . montelukast (SINGULAIR) 10 MG tablet Take 10 mg by mouth at  bedtime.   05/30/2019 at Unknown time  . nebivolol (BYSTOLIC) 5 MG tablet Take 5 mg by mouth daily.   05/30/2019 at Unknown time  . pantoprazole (PROTONIX) 40 MG tablet TAKE 1 TABLET BY MOUTH EVERY MORNING 90 tablet 3 05/30/2019 at Unknown time  . warfarin (COUMADIN) 5 MG tablet TAKE AS DIRECTED BY COUMADIN CLINIC 30 tablet 3 05/30/2019 at 2100    Assessment: Pharmacy consulted to dose warfarin in patient with mitral valve replacement. He was admitted for acute rectal bleed with Vitamin K 5 mg IV given on 2/11.  Patient's INR down to 1.5 and colonoscopy completed.  Home dose of warfarin listed as 2.5 mg on Tues and 5 mg ROW.  Last dose of warfarin given 2/10 2100.  Goal of Therapy:  INR 2.5-3.5 Monitor platelets by anticoagulation protocol: Yes   Plan:  Warfarin 7.5 mg x 1 dose. Monitor daily INR and s/s of bleeding  [id number], PharmD Clinical Pharmacist 06/01/2019 2:49 PM

## 2019-06-01 NOTE — Progress Notes (Signed)
PROGRESS NOTE    Luis Evans, PhD  ZTI:[id number] DOB: 08-23-1950 DOA: 05/31/2019 PCP: Celene Squibb, MD   Brief Narrative:  Per HPI: Luis Evans, PhD is a 69 y.o. male with medical history significant for history of mechanical mitral valve placement on chronic anticoagulation with Coumadin, dyslipidemia, hypertension, allergic rhinitis, erosive reflux esophagitis, erosive gastritis, pancolonic diverticulosis, and internal hemorrhoids with last EGD and colonoscopy on 03/2014.  He presented to the emergency department earlier this morning after he had an episode of rectal bleeding and diarrhea at approximately 5:15 AM.  He noticed a lot of dark red blood in the toilet bowl and had lower abdominal discomfort that he noticed prior to going to bed that he claims felt more like "indigestion."  He denies any nausea or vomiting, fevers, or chills.  He was mildly diaphoretic this morning, but did not have any other weakness or lightheadedness or dizziness.  He took his last dose of Coumadin last night and denies taking any aspirin or NSAIDs.  2/11: Patient was admitted with acute rectal bleed that was suspected to be diverticular in nature with elevated INR.  He has been seen by GI with plans for colonoscopy this afternoon.  Due to concern for diverticulitis, he was also started on ciprofloxacin and metronidazole.  He is currently undergoing colon prep.  His bleeding appears to have resolved at this point.  Assessment & Plan:   Active Problems:   Lower GI bleed  Acute blood loss anemia likely secondary to diverticular bleed in the setting of chronic anticoagulation with Coumadin -Suspect that this is a diverticular bleed as patient has had prior colonoscopy with pandiverticulosis noted.  He is also noted to have internal hemorrhoids. -There is some suspicion for diverticulitis for which she has been started on ciprofloxacin and Flagyl per GI -Continue monitor closely in stepdown unit and  transfuse as needed, repeat H&H -Hold Coumadin -INR currently 1.5 after vitamin K IV given yesterday -Avoid heparin agents -Currently on clear liquid diet -Undergoing colon prep with plans for colonoscopy this afternoon -Repeat CBC and INR in a.m.  Hypotension secondary to above-currently stable -Hold home antihypertensives -Monitor closely in stepdown unit and bolus further/transfuse as needed for hemoglobin less than 7  History of hypertension -Hold home blood pressure medications  History of dyslipidemia -Hold Vytorin  History of GERD/gastritis/erosive esophagitis -PPI IV daily  History of allergic rhinitis -Maintain on Singulair   DVT prophylaxis: SCDs Code Status: Full Family Communication: None at bedside Disposition Plan: Plan for colonoscopy this afternoon.  Monitor CBC.  Maintain on ciprofloxacin and Flagyl.   Consultants:   GI  Procedures:   See below  Antimicrobials:  Anti-infectives (From admission, onward)   Start     Dose/Rate Route Frequency Ordered Stop   05/31/19 1245  ciprofloxacin (CIPRO) IVPB 400 mg     400 mg 200 mL/hr over 60 Minutes Intravenous Every 12 hours 05/31/19 1241     05/31/19 1245  metroNIDAZOLE (FLAGYL) IVPB 500 mg     500 mg 100 mL/hr over 60 Minutes Intravenous Every 8 hours 05/31/19 1241         Subjective: Patient seen and evaluated today with no new acute complaints or concerns. No acute concerns or events noted overnight.  He has not passed any more blood per rectum and denies abdominal pain.  His vital signs are currently stable and he is undergoing liquid prep for colonoscopy.  Objective: Vitals:   06/01/19 0600 06/01/19 0700 06/01/19 0800 06/01/19 0900  BP:    (!) 119/107  Pulse: 72 82 87 82  Resp: 13 16 16  (!) 22  Temp:    98.4 F (36.9 C)  TempSrc:    Oral  SpO2: 94% 94% 96% 98%  Weight:      Height:        Intake/Output Summary (Last 24 hours) at 06/01/2019 1107 Last data filed at 06/01/2019  0600 Gross per 24 hour  Intake 1933.23 ml  Output 1 ml  Net 1932.23 ml   Filed Weights   05/31/19 1252 05/31/19 1516 06/01/19 0500  Weight: 94.8 kg 94.8 kg 95.8 kg    Examination:  General exam: Appears calm and comfortable  Respiratory system: Clear to auscultation. Respiratory effort normal. Cardiovascular system: S1 & S2 heard, RRR. No JVD, murmurs, rubs, gallops or clicks. No pedal edema. Gastrointestinal system: Abdomen is nondistended, soft and nontender. No organomegaly or masses felt. Normal bowel sounds heard. Central nervous system: Alert and oriented. No focal neurological deficits. Extremities: Symmetric 5 x 5 power. Skin: No rashes, lesions or ulcers Psychiatry: Judgement and insight appear normal. Mood & affect appropriate.     Data Reviewed: I have personally reviewed following labs and imaging studies  CBC: Recent Labs  Lab 05/31/19 0609 05/31/19 1133 05/31/19 1642 06/01/19 0334  WBC 7.4  --   --  8.1  NEUTROABS 3.8  --   --   --   HGB 15.4 13.5 12.7* 11.3*  HCT 49.0 42.7 39.9 35.3*  MCV 94.0  --   --  93.4  PLT 271  --   --  225   Basic Metabolic Panel: Recent Labs  Lab 05/31/19 0609 06/01/19 0334  NA 141 138  K 3.8 3.9  CL 108 105  CO2 28 25  GLUCOSE 134* 135*  BUN 28* 19  CREATININE 1.13 0.89  CALCIUM 9.3 8.4*   GFR: Estimated Creatinine Clearance: 88.5 mL/min (by C-G formula based on SCr of 0.89 mg/dL). Liver Function Tests: Recent Labs  Lab 06/01/19 0334  AST 16  ALT 15  ALKPHOS 27*  BILITOT 0.7  PROT 5.4*  ALBUMIN 3.2*   No results for input(s): LIPASE, AMYLASE in the last 168 hours. No results for input(s): AMMONIA in the last 168 hours. Coagulation Profile: Recent Labs  Lab 05/31/19 0609 06/01/19 0334  INR 2.4* 1.5*   Cardiac Enzymes: No results for input(s): CKTOTAL, CKMB, CKMBINDEX, TROPONINI in the last 168 hours. BNP (last 3 results) No results for input(s): PROBNP in the last 8760 hours. HbA1C: No results  for input(s): HGBA1C in the last 72 hours. CBG: No results for input(s): GLUCAP in the last 168 hours. Lipid Profile: No results for input(s): CHOL, HDL, LDLCALC, TRIG, CHOLHDL, LDLDIRECT in the last 72 hours. Thyroid Function Tests: No results for input(s): TSH, T4TOTAL, FREET4, T3FREE, THYROIDAB in the last 72 hours. Anemia Panel: No results for input(s): VITAMINB12, FOLATE, FERRITIN, TIBC, IRON, RETICCTPCT in the last 72 hours. Sepsis Labs: No results for input(s): PROCALCITON, LATICACIDVEN in the last 168 hours.  Recent Results (from the past 240 hour(s))  SARS CORONAVIRUS 2 (TAT 6-24 HRS) Nasopharyngeal Nasopharyngeal Swab     Status: None   Collection Time: 05/31/19  6:30 AM   Specimen: Nasopharyngeal Swab  Result Value Ref Range Status   SARS Coronavirus 2 NEGATIVE NEGATIVE Final    Comment: (NOTE) SARS-CoV-2 target nucleic acids are NOT DETECTED. The SARS-CoV-2 RNA is generally detectable in upper and lower respiratory specimens during the acute phase of infection.  Negative results do not preclude SARS-CoV-2 infection, do not rule out co-infections with other pathogens, and should not be used as the sole basis for treatment or other patient management decisions. Negative results must be combined with clinical observations, patient history, and epidemiological information. The expected result is Negative. Fact Sheet for Patients: HairSlick.no Fact Sheet for Healthcare Providers: quierodirigir.com This test is not yet approved or cleared by the [place] FDA and  has been authorized for detection and/or diagnosis of SARS-CoV-2 by FDA under an Emergency Use Authorization (EUA). This EUA will remain  in effect (meaning this test can be used) for the duration of the COVID-19 declaration under Section 56 4(b)(1) of the Act, 21 U.S.C. section 360bbb-3(b)(1), unless the authorization is terminated or revoked  sooner. Performed at [facility], [address] [place], [place] [address]   MRSA PCR Screening     Status: None   Collection Time: 05/31/19  3:16 PM   Specimen: Nasopharyngeal  Result Value Ref Range Status   MRSA by PCR NEGATIVE NEGATIVE Final    Comment:        The GeneXpert MRSA Assay (FDA approved for NASAL specimens only), is one component of a comprehensive MRSA colonization surveillance program. It is not intended to diagnose MRSA infection nor to guide or monitor treatment for MRSA infections. Performed at [facility], [address], [place], [place] [address]          Radiology Studies: No results found.      Scheduled Meds: . Chlorhexidine Gluconate Cloth  6 each Topical Daily  . montelukast  10 mg Oral QHS  . pantoprazole (PROTONIX) IV  40 mg Intravenous Q24H   Continuous Infusions: . ciprofloxacin Stopped (06/01/19 0132)  . lactated ringers 75 mL/hr at 06/01/19 0600  . metronidazole Stopped (06/01/19 0542)     LOS: 0 days    Time spent: 30 minutes    Ramin Zoll Hoover Brunette, DO [facility] Pager [phone]  If 7PM-7AM, please contact night-coverage [email or web address] Password [facility] 06/01/2019, 11:07 AM

## 2019-06-02 DIAGNOSIS — D62 Acute posthemorrhagic anemia: Secondary | ICD-10-CM

## 2019-06-02 LAB — HEPARIN LEVEL (UNFRACTIONATED)
Heparin Unfractionated: 0.55 IU/mL (ref 0.30–0.70)
Heparin Unfractionated: 0.87 IU/mL — ABNORMAL HIGH (ref 0.30–0.70)
Heparin Unfractionated: 0.86 IU/mL — ABNORMAL HIGH (ref 0.30–0.70)
Heparin Unfractionated: 0.55 IU/mL (ref 0.30–0.70)

## 2019-06-02 LAB — BASIC METABOLIC PANEL
Anion gap: 9 (ref 5–15)
BUN: 12 mg/dL (ref 8–23)
CO2: 27 mmol/L (ref 22–32)
Calcium: 8.7 mg/dL — ABNORMAL LOW (ref 8.9–10.3)
Chloride: 103 mmol/L (ref 98–111)
Creatinine, Ser: 0.88 mg/dL (ref 0.61–1.24)
GFR calc Af Amer: >60 mL/min (ref >60)
GFR calc non Af Amer: >60 mL/min (ref >60)
Glucose, Bld: 122 mg/dL — ABNORMAL HIGH (ref 70–99)
Potassium: 4.1 mmol/L (ref 3.5–5.1)
Sodium: 139 mmol/L (ref 135–145)

## 2019-06-02 LAB — CBC
HCT: 34.8 % — ABNORMAL LOW (ref 39.0–52.0)
Hemoglobin: 11.1 g/dL — ABNORMAL LOW (ref 13.0–17.0)
MCH: 29.9 pg (ref 26.0–34.0)
MCHC: 31.9 g/dL (ref 30.0–36.0)
MCV: 93.8 fL (ref 80.0–100.0)
Platelets: 245 10*3/uL (ref 150–400)
RBC: 3.71 MIL/uL — ABNORMAL LOW (ref 4.22–5.81)
RDW: 13.4 % (ref 11.5–15.5)
WBC: 10.7 10*3/uL — ABNORMAL HIGH (ref 4.0–10.5)
nRBC: 0 % (ref 0.0–0.2)

## 2019-06-02 LAB — PROTIME-INR
INR: 1.3 — ABNORMAL HIGH (ref 0.8–1.2)
Prothrombin Time: 15.9 seconds — ABNORMAL HIGH (ref 11.4–15.2)

## 2019-06-02 MED ORDER — EZETIMIBE-SIMVASTATIN 10-20 MG PO TABS: 1.0000 | ORAL_TABLET | Freq: Every day | ORAL | Status: DC

## 2019-06-02 MED ORDER — PANTOPRAZOLE SODIUM 40 MG PO TBEC
40.0000 mg | DELAYED_RELEASE_TABLET | Freq: Every day | ORAL | Status: DC
  Administered 2019-06-03: 09:00:00 40 mg via ORAL
  Filled 2019-06-02: qty 1

## 2019-06-02 MED ORDER — LORATADINE 10 MG PO TABS
10.0000 mg | ORAL_TABLET | Freq: Every day | ORAL | Status: DC
  Administered 2019-06-02 – 2019-06-03 (×2): 10 mg via ORAL
  Filled 2019-06-02 (×2): qty 1

## 2019-06-02 MED ORDER — LISINOPRIL 10 MG PO TABS
40.0000 mg | ORAL_TABLET | Freq: Every day | ORAL | Status: DC
  Administered 2019-06-02 – 2019-06-03 (×2): 40 mg via ORAL
  Filled 2019-06-02 (×2): qty 4

## 2019-06-02 MED ORDER — NEBIVOLOL HCL 10 MG PO TABS
5.0000 mg | ORAL_TABLET | Freq: Every day | ORAL | Status: DC
  Administered 2019-06-02 – 2019-06-03 (×2): 5 mg via ORAL
  Filled 2019-06-02 (×2): qty 1

## 2019-06-02 MED ORDER — SIMVASTATIN 20 MG PO TABS
20.0000 mg | ORAL_TABLET | Freq: Every day | ORAL | Status: DC
  Administered 2019-06-02: 19:00:00 20 mg via ORAL
  Filled 2019-06-02: qty 1

## 2019-06-02 MED ORDER — EZETIMIBE 10 MG PO TABS
10.0000 mg | ORAL_TABLET | Freq: Every day | ORAL | Status: DC
  Administered 2019-06-02: 19:00:00 10 mg via ORAL
  Filled 2019-06-02: qty 1

## 2019-06-02 MED ORDER — WARFARIN SODIUM 5 MG PO TABS
5.0000 mg | ORAL_TABLET | Freq: Once | ORAL | Status: AC
  Administered 2019-06-02: 19:00:00 5 mg via ORAL
  Filled 2019-06-02: qty 1

## 2019-06-02 NOTE — Progress Notes (Addendum)
  Assessment/Plan: ADMITTED WITH PAINLESS RECTAL BLEEDING  DUE TO DIVERTICULOSIS. HAD SIMILAR EPISODE ON ADVIL A COUPLE OF YEARS AGO. CLINICALLY IMPROVED ON HEPARIN gtt.   PLAN: 1. DISCUSSED FUTURE OPTIONS IF HE RE-BLEEDS INCLUDING A SUBTOTAL COLECTOMY OR REPEAT COLONOSCOPY WITH INTERVENTION. 2. SUPPORTIVE CARE 3. CHANGE PROTONIX TO PO. 4. Reviewed findings OF COLONOSCOPY.    Subjective: Since last evaluated the patient, pt has had 4 loose BMS. NO BRBPR OR MELENA.  NAUSEA/VOMITING/ABDOMINAL PAIN.  Objective: Vital signs in last 24 hours: Vitals:   06/01/19 2116 06/02/19 0637  BP: 123/78 (!) 151/89  Pulse: (!) 104 98  Resp:  18  Temp: 100 F (37.8 C) 98.4 F (36.9 C)  SpO2: 94% 96%   General appearance: alert, cooperative and no distress Resp: clear to auscultation bilaterally Cardio: regular rate and rhythm GI: soft, non-tender; bowel sounds normal; no masses,  no organomegaly  Lab Results:  Cr 0.88 Hb 10.7 plt ct 245   Studies/Results: No results found.  Medications: I have reviewed the patient's current medications.

## 2019-06-02 NOTE — Progress Notes (Signed)
PROGRESS NOTE    Luis Natal, PhD  WUZ:[id number] DOB: Apr 02, 1951 DOA: 05/31/2019 PCP: [name], MD   Brief Narrative:  Per HPI: Luis Haynes, PhDis a 69 y.o.malewith medical history significant forhistory of mechanical mitral valve placement on chronic anticoagulation with Coumadin, dyslipidemia, hypertension, allergic rhinitis, erosive reflux esophagitis, erosive gastritis, pancolonic diverticulosis, and internal hemorrhoids with last EGD and colonoscopy on 03/2014.He presented to the emergency department earlier this morning after he had an episode of rectal bleeding and diarrhea at approximately 5:15 AM. He noticed a lot of dark red blood in the toilet bowl and had lower abdominal discomfort that he noticed prior to going to bed that he claims felt more like "indigestion." He denies any nausea or vomiting, fevers, or chills. He was mildly diaphoretic this morning, but did not have any other weakness or lightheadedness or dizziness. He took his last dose of Coumadin last night and denies taking any aspirin or NSAIDs.  2/12: Patient was admitted with acute rectal bleed that was suspected to be diverticular in nature with elevated INR.  He has been seen by GI with plans for colonoscopy this afternoon.  Due to concern for diverticulitis, he was also started on ciprofloxacin and metronidazole.  He is currently undergoing colon prep.  His bleeding appears to have resolved at this point.  2/13: Patient had undergone colonoscopy without any active bleeding noted on 2/12.  He has been resumed on his Coumadin as well as heparin drip for bridging.  No further bleeding episodes noted overnight and he appears to be doing well.  He remains on ciprofloxacin and Flagyl per GI due to suspicion for diverticulitis.  Continue to monitor for another 24 hours and anticipate discharge in a.m. if no further bleeding episodes noted.  Assessment & Plan:   Active Problems:   Lower GI bleed  Acute blood loss anemia   Acute blood loss anemia likely secondary to diverticular bleed in the setting of chronic anticoagulation with Coumadin -No significant bleeding noted on colonoscopy 2/12 -Continue to monitor for any further bleeding episodes while on heparin drip, Coumadin resumed -H&H has so far remained stable -Continue ciprofloxacin and Flagyl for suspected diverticulitis per GI  Hypotension secondary to above-currently stable -Blood pressures appear to be stabilizing and will resume home antihypertensives today  History of hypertension -Hold home blood pressure medications  History of dyslipidemia -Hold Vytorin  History of GERD/gastritis/erosive esophagitis -PPI IV daily  History of allergic rhinitis -Maintain on Singulair   DVT prophylaxis: SCDs Code Status: Full Family Communication: None at bedside Disposition Plan:  Monitor for another 24 hours while on heparin drip to ensure no further bleeding and recheck CBC in a.m.  Appreciate GI recommendations.   Consultants:   GI  Procedures:   See below  Antimicrobials:  Anti-infectives (From admission, onward)   Start     Dose/Rate Route Frequency Ordered Stop   05/31/19 1245  ciprofloxacin (CIPRO) IVPB 400 mg  Status:  Discontinued     400 mg 200 mL/hr over 60 Minutes Intravenous Every 12 hours 05/31/19 1241 06/01/19 1440   05/31/19 1245  metroNIDAZOLE (FLAGYL) IVPB 500 mg  Status:  Discontinued     500 mg 100 mL/hr over 60 Minutes Intravenous Every 8 hours 05/31/19 1241 06/01/19 1440       Subjective: Patient seen and evaluated today with no new acute complaints or concerns. No acute concerns or events noted overnight.  No further bleeding episodes noted.  No abdominal pain.  Objective: Vitals:  06/01/19 1600 06/01/19 1637 06/01/19 2116 06/02/19 0637  BP:  133/84 123/78 (!) 151/89  Pulse: 97 86 (!) 104 98  Resp:  18  18  Temp:   100 F (37.8 C) 98.4 F (36.9 C)  TempSrc:   Oral  Oral  SpO2: 98% 94% 94% 96%  Weight:      Height:        Intake/Output Summary (Last 24 hours) at 06/02/2019 1126 Last data filed at 06/02/2019 0900 Gross per 24 hour  Intake 1939.68 ml  Output --  Net 1939.68 ml   Filed Weights   05/31/19 1516 06/01/19 0500 06/01/19 1329  Weight: 94.8 kg 95.8 kg 95.7 kg    Examination:  General exam: Appears calm and comfortable  Respiratory system: Clear to auscultation. Respiratory effort normal. Cardiovascular system: S1 & S2 heard, RRR. No JVD, murmurs, rubs, gallops or clicks. No pedal edema. Gastrointestinal system: Abdomen is nondistended, soft and nontender. No organomegaly or masses felt. Normal bowel sounds heard. Central nervous system: Alert and oriented. No focal neurological deficits. Extremities: Symmetric 5 x 5 power. Skin: No rashes, lesions or ulcers Psychiatry: Judgement and insight appear normal. Mood & affect appropriate.     Data Reviewed: I have personally reviewed following labs and imaging studies  CBC: Recent Labs  Lab 05/31/19 0609 05/31/19 1133 05/31/19 1642 06/01/19 0334 06/02/19 0210  WBC 7.4  --   --  8.1 10.7*  NEUTROABS 3.8  --   --   --   --   HGB 15.4 13.5 12.7* 11.3* 11.1*  HCT 49.0 42.7 39.9 35.3* 34.8*  MCV 94.0  --   --  93.4 93.8  PLT 271  --   --  225 245   Basic Metabolic Panel: Recent Labs  Lab 05/31/19 0609 06/01/19 0334 06/02/19 0209  NA 141 138 139  K 3.8 3.9 4.1  CL 108 105 103  CO2 28 25 27   GLUCOSE 134* 135* 122*  BUN 28* 19 12  CREATININE 1.13 0.89 0.88  CALCIUM 9.3 8.4* 8.7*   GFR: Estimated Creatinine Clearance: 89.5 mL/min (by C-G formula based on SCr of 0.88 mg/dL). Liver Function Tests: Recent Labs  Lab 06/01/19 0334  AST 16  ALT 15  ALKPHOS 27*  BILITOT 0.7  PROT 5.4*  ALBUMIN 3.2*   No results for input(s): LIPASE, AMYLASE in the last 168 hours. No results for input(s): AMMONIA in the last 168 hours. Coagulation Profile: Recent Labs  Lab  05/31/19 0609 06/01/19 0334 06/02/19 0209  INR 2.4* 1.5* 1.3*   Cardiac Enzymes: No results for input(s): CKTOTAL, CKMB, CKMBINDEX, TROPONINI in the last 168 hours. BNP (last 3 results) No results for input(s): PROBNP in the last 8760 hours. HbA1C: No results for input(s): HGBA1C in the last 72 hours. CBG: No results for input(s): GLUCAP in the last 168 hours. Lipid Profile: No results for input(s): CHOL, HDL, LDLCALC, TRIG, CHOLHDL, LDLDIRECT in the last 72 hours. Thyroid Function Tests: No results for input(s): TSH, T4TOTAL, FREET4, T3FREE, THYROIDAB in the last 72 hours. Anemia Panel: No results for input(s): VITAMINB12, FOLATE, FERRITIN, TIBC, IRON, RETICCTPCT in the last 72 hours. Sepsis Labs: No results for input(s): PROCALCITON, LATICACIDVEN in the last 168 hours.  Recent Results (from the past 240 hour(s))  SARS CORONAVIRUS 2 (TAT 6-24 HRS) Nasopharyngeal Nasopharyngeal Swab     Status: None   Collection Time: 05/31/19  6:30 AM   Specimen: Nasopharyngeal Swab  Result Value Ref Range Status   SARS Coronavirus  2 NEGATIVE NEGATIVE Final    Comment: (NOTE) SARS-CoV-2 target nucleic acids are NOT DETECTED. The SARS-CoV-2 RNA is generally detectable in upper and lower respiratory specimens during the acute phase of infection. Negative results do not preclude SARS-CoV-2 infection, do not rule out co-infections with other pathogens, and should not be used as the sole basis for treatment or other patient management decisions. Negative results must be combined with clinical observations, patient history, and epidemiological information. The expected result is Negative. Fact Sheet for Patients: SugarRoll.be Fact Sheet for Healthcare Providers: https://www.woods-mathews.com/ This test is not yet approved or cleared by the [place] FDA and  has been authorized for detection and/or diagnosis of SARS-CoV-2 by FDA under an Emergency  Use Authorization (EUA). This EUA will remain  in effect (meaning this test can be used) for the duration of the COVID-19 declaration under Section 56 4(b)(1) of the Act, 21 U.S.C. section 360bbb-3(b)(1), unless the authorization is terminated or revoked sooner. Performed at [facility], [place] [address], [place], [place] [address]   MRSA PCR Screening     Status: None   Collection Time: 05/31/19  3:16 PM   Specimen: Nasopharyngeal  Result Value Ref Range Status   MRSA by PCR NEGATIVE NEGATIVE Final    Comment:        The GeneXpert MRSA Assay (FDA approved for NASAL specimens only), is one component of a comprehensive MRSA colonization surveillance program. It is not intended to diagnose MRSA infection nor to guide or monitor treatment for MRSA infections. Performed at [facility], [address], [place],  [address]          Radiology Studies: No results found.      Scheduled Meds: . Chlorhexidine Gluconate Cloth  6 each Topical Daily  . montelukast  10 mg Oral QHS  . pantoprazole (PROTONIX) IV  40 mg Intravenous Q24H  . warfarin  5 mg Oral Once  . Warfarin - Pharmacist Dosing Inpatient   Does not apply q1800   Continuous Infusions: . heparin 1,500 Units/hr (06/02/19 1030)     LOS: 1 day    Time spent: 30 minutes    Jerol Rufener Darleen Crocker, DO [facility] Pager [phone]  If 7PM-7AM, please contact night-coverage [email or web address] Password [facility] 06/02/2019, 11:26 AM

## 2019-06-02 NOTE — Progress Notes (Signed)
ANTICOAGULATION CONSULT NOTE -   Pharmacy Consult for heparin gtt/ warfarin Indication: mitral valve replacement  Allergies  Allergen Reactions  . Amoxicillin     GI upset  . Erythromycin   . Sulfonamide Derivatives     Patient Measurements: Height: 6\' 1"  (185.4 cm) Weight: 211 lb (95.7 kg) IBW/kg (Calculated) : 79.9 Heparin Dosing Weight: HEPARIN DW (KG): 95.7   Vital Signs: BP: 133/92 (02/13 2106) Pulse Rate: 101 (02/13 2106)  Labs: Recent Labs    05/31/19 0609 05/31/19 1133 05/31/19 1642 05/31/19 1642 06/01/19 0334 06/02/19 0209 06/02/19 0209 06/02/19 0210 06/02/19 0652 06/02/19 1212 06/02/19 2033  HGB 15.4   < > 12.7*   < > 11.3*  --   --  11.1*  --   --   --   HCT 49.0   < > 39.9  --  35.3*  --   --  34.8*  --   --   --   PLT 271  --   --   --  225  --   --  245  --   --   --   LABPROT 25.7*  --   --   --  17.6* 15.9*  --   --   --   --   --   INR 2.4*  --   --   --  1.5* 1.3*  --   --   --   --   --   HEPARINUNFRC  --   --   --   --   --  0.55   < >  --  0.87* 0.86* 0.55  CREATININE 1.13  --   --   --  0.89 0.88  --   --   --   --   --    < > = values in this interval not displayed.    Estimated Creatinine Clearance: 89.5 mL/min (by C-G formula based on SCr of 0.88 mg/dL).   Medical History: Past Medical History:  Diagnosis Date  . ALLERGIC RHINITIS   . H/O mitral valve replacement    10 yrs ago  . HYPERLIPIDEMIA   . Hypertension   . Long term current use of anticoagulant   . MITRAL VALVE PROLAPSE   . PREMATURE VENTRICULAR CONTRACTIONS     Medications:  Medications Prior to Admission  Medication Sig Dispense Refill Last Dose  . cetirizine (ZYRTEC) 10 MG tablet Take 1 tablet by mouth daily.   05/30/2019 at Unknown time  . ezetimibe-simvastatin (VYTORIN) 10-20 MG tablet Take 1 tablet by mouth daily.   05/30/2019 at Unknown time  . guaiFENesin (MUCINEX) 600 MG 12 hr tablet Take 600 mg by mouth 2 (two) times daily.   05/30/2019 at Unknown time  .  icosapent Ethyl (VASCEPA) 1 g capsule Take 1 capsule by mouth 2 (two) times daily.     Marland Kitchen lisinopril (PRINIVIL,ZESTRIL) 40 MG tablet Take 40 mg by mouth daily. Reported on 07/14/2015   05/30/2019 at Unknown time  . montelukast (SINGULAIR) 10 MG tablet Take 10 mg by mouth at bedtime.   05/30/2019 at Unknown time  . nebivolol (BYSTOLIC) 5 MG tablet Take 5 mg by mouth daily.   05/30/2019 at Unknown time  . pantoprazole (PROTONIX) 40 MG tablet TAKE 1 TABLET BY MOUTH EVERY MORNING 90 tablet 3 05/30/2019 at Unknown time  . warfarin (COUMADIN) 5 MG tablet TAKE AS DIRECTED BY COUMADIN CLINIC 30 tablet 3 05/30/2019 at 2100   Scheduled:  . Chlorhexidine Gluconate Cloth  6 each Topical Daily  . ezetimibe  10 mg Oral Daily   And  . simvastatin  20 mg Oral q1800  . lisinopril  40 mg Oral Daily  . loratadine  10 mg Oral Daily  . montelukast  10 mg Oral QHS  . nebivolol  5 mg Oral Daily  . [START ON 06/03/2019] pantoprazole  40 mg Oral QAC breakfast  . Warfarin - Pharmacist Dosing Inpatient   Does not apply q1800   Infusions:  . heparin 1,300 Units/hr (06/02/19 1300)   PRN: acetaminophen **OR** acetaminophen, labetalol, ondansetron **OR** ondansetron (ZOFRAN) IV Anti-infectives (From admission, onward)   Start     Dose/Rate Route Frequency Ordered Stop   05/31/19 1245  ciprofloxacin (CIPRO) IVPB 400 mg  Status:  Discontinued     400 mg 200 mL/hr over 60 Minutes Intravenous Every 12 hours 05/31/19 1241 06/01/19 1440   05/31/19 1245  metroNIDAZOLE (FLAGYL) IVPB 500 mg  Status:  Discontinued     500 mg 100 mL/hr over 60 Minutes Intravenous Every 8 hours 05/31/19 1241 06/01/19 1440      Assessment: Luis Natal, PhD a 69 y.o. male requires anticoagulation with a heparin iv infusion for the indication mitral valve replacement. He was admitted for acute rectal bleed with Vitamin K 5 mg IV given on 2/11.  Patient's INR down to 1.5 and colonoscopy completed.   Home dose of warfarin listed as 2.5 mg on  Tues and 5 mg ROW.  Last dose of warfarin given 2/10 2100.  HL 0.55- therapeutic  Goal of Therapy:  INR 2.5-3.5 Heparin level 0.3-0.7 units/ml Monitor platelets by anticoagulation protocol: Yes   Plan:  Warfarin 5 mg x 1 dose. Continue heparin infusion at 1300 units/hr. Check anti-Xa level in 6-8 hours and daily while on heparin. Continue to monitor H&H and platelets.   Judeth Cornfield, PharmD Clinical Pharmacist 06/02/2019 9:22 PM

## 2019-06-02 NOTE — Plan of Care (Signed)
  Problem: Education: Goal: Knowledge of General Education information will improve Description: Including pain rating scale, medication(s)/side effects and non-pharmacologic comfort measures Outcome: Progressing   Problem: Health Behavior/Discharge Planning: Goal: Ability to manage health-related needs will improve Outcome: Progressing   Problem: Clinical Measurements: Goal: Ability to maintain clinical measurements within normal limits will improve Outcome: Progressing Goal: Will remain free from infection Outcome: Progressing Goal: Diagnostic test results will improve Outcome: Progressing Goal: Respiratory complications will improve Outcome: Progressing Goal: Cardiovascular complication will be avoided Outcome: Progressing   Problem: Activity: Goal: Risk for activity intolerance will decrease Outcome: Progressing   Problem: Nutrition: Goal: Adequate nutrition will be maintained Outcome: Progressing   Problem: Coping: Goal: Level of anxiety will decrease Outcome: Progressing   Problem: Elimination: Goal: Will not experience complications related to bowel motility Outcome: Progressing Goal: Will not experience complications related to urinary retention Outcome: Progressing   Problem: Pain Managment: Goal: General experience of comfort will improve Outcome: Progressing   Problem: Safety: Goal: Ability to remain free from injury will improve Outcome: Progressing   Problem: Skin Integrity: Goal: Risk for impaired skin integrity will decrease Outcome: Progressing   Problem: Education: Goal: Ability to identify signs and symptoms of gastrointestinal bleeding will improve Outcome: Progressing   Problem: Bowel/Gastric: Goal: Will show no signs and symptoms of gastrointestinal bleeding Outcome: Progressing   Problem: Fluid Volume: Goal: Will show no signs and symptoms of excessive bleeding Outcome: Progressing   Problem: Clinical Measurements: Goal:  Complications related to the disease process, condition or treatment will be avoided or minimized Outcome: Progressing   Problem: Education: Goal: Ability to identify signs and symptoms of gastrointestinal bleeding will improve Outcome: Progressing   Problem: Bowel/Gastric: Goal: Will show no signs and symptoms of gastrointestinal bleeding Outcome: Progressing   Problem: Fluid Volume: Goal: Will show no signs and symptoms of excessive bleeding Outcome: Progressing   Problem: Clinical Measurements: Goal: Complications related to the disease process, condition or treatment will be avoided or minimized Outcome: Progressing

## 2019-06-02 NOTE — Progress Notes (Signed)
ANTICOAGULATION CONSULT NOTE   Pharmacy Consult for Heparin  Indication: atrial fibrillation  Allergies  Allergen Reactions  . Amoxicillin     GI upset  . Erythromycin   . Sulfonamide Derivatives     Patient Measurements: Height: 6\' 1"  (185.4 cm) Weight: 211 lb (95.7 kg) IBW/kg (Calculated) : 79.9 Heparin Dosing Weight: HEPARIN DW (KG): 95.7   Vital Signs: Temp: 100 F (37.8 C) (02/12 2116) Temp Source: Oral (02/12 2116) BP: 123/78 (02/12 2116) Pulse Rate: 104 (02/12 2116)  Labs: Recent Labs    05/31/19 0609 05/31/19 1133 05/31/19 1642 05/31/19 1642 06/01/19 0334 06/02/19 0209 06/02/19 0210  HGB 15.4   < > 12.7*   < > 11.3*  --  11.1*  HCT 49.0   < > 39.9  --  35.3*  --  34.8*  PLT 271  --   --   --  225  --  245  LABPROT 25.7*  --   --   --  17.6* 15.9*  --   INR 2.4*  --   --   --  1.5* 1.3*  --   HEPARINUNFRC  --   --   --   --   --  0.55  --   CREATININE 1.13  --   --   --  0.89 0.88  --    < > = values in this interval not displayed.    Estimated Creatinine Clearance: 89.5 mL/min (by C-G formula based on SCr of 0.88 mg/dL).   Medical History: Past Medical History:  Diagnosis Date  . ALLERGIC RHINITIS   . H/O mitral valve replacement    10 yrs ago  . HYPERLIPIDEMIA   . Hypertension   . Long term current use of anticoagulant   . MITRAL VALVE PROLAPSE   . PREMATURE VENTRICULAR CONTRACTIONS     Medications:  Medications Prior to Admission  Medication Sig Dispense Refill Last Dose  . cetirizine (ZYRTEC) 10 MG tablet Take 1 tablet by mouth daily.   05/30/2019 at Unknown time  . ezetimibe-simvastatin (VYTORIN) 10-20 MG tablet Take 1 tablet by mouth daily.   05/30/2019 at Unknown time  . guaiFENesin (MUCINEX) 600 MG 12 hr tablet Take 600 mg by mouth 2 (two) times daily.   05/30/2019 at Unknown time  . icosapent Ethyl (VASCEPA) 1 g capsule Take 1 capsule by mouth 2 (two) times daily.     07/28/2019 lisinopril (PRINIVIL,ZESTRIL) 40 MG tablet Take 40 mg by mouth  daily. Reported on 07/14/2015   05/30/2019 at Unknown time  . montelukast (SINGULAIR) 10 MG tablet Take 10 mg by mouth at bedtime.   05/30/2019 at Unknown time  . nebivolol (BYSTOLIC) 5 MG tablet Take 5 mg by mouth daily.   05/30/2019 at Unknown time  . pantoprazole (PROTONIX) 40 MG tablet TAKE 1 TABLET BY MOUTH EVERY MORNING 90 tablet 3 05/30/2019 at Unknown time  . warfarin (COUMADIN) 5 MG tablet TAKE AS DIRECTED BY COUMADIN CLINIC 30 tablet 3 05/30/2019 at 2100   Scheduled:  . Chlorhexidine Gluconate Cloth  6 each Topical Daily  . montelukast  10 mg Oral QHS  . pantoprazole (PROTONIX) IV  40 mg Intravenous Q24H  . Warfarin - Pharmacist Dosing Inpatient   Does not apply q1800   Infusions:  . heparin 1,500 Units/hr (06/01/19 1906)  . lactated ringers Stopped (06/01/19 1512)   PRN: acetaminophen **OR** acetaminophen, labetalol, ondansetron **OR** ondansetron (ZOFRAN) IV Anti-infectives (From admission, onward)   Start     Dose/Rate Route  Frequency Ordered Stop   05/31/19 1245  ciprofloxacin (CIPRO) IVPB 400 mg  Status:  Discontinued     400 mg 200 mL/hr over 60 Minutes Intravenous Every 12 hours 05/31/19 1241 06/01/19 1440   05/31/19 1245  metroNIDAZOLE (FLAGYL) IVPB 500 mg  Status:  Discontinued     500 mg 100 mL/hr over 60 Minutes Intravenous Every 8 hours 05/31/19 1241 06/01/19 1440      Assessment: Luis Evans, PhD a 69 y.o. male requires anticoagulation with a heparin iv infusion for the indication of  atrial fibrillation. Heparin gtt will be started following pharmacy protocol per pharmacy consult while INR is sub-therapeutic  Heparin level therapeutic this AM INR 1.3  Goal of Therapy:  Heparin level 0.3-0.7 units/ml Monitor platelets by anticoagulation protocol: Yes   Plan:  Cont heparin drip at 1500 units/hr Confirmatory heparin level at [place], PharmD, [place] Pharmacist Phone: [phone]

## 2019-06-02 NOTE — Progress Notes (Addendum)
ANTICOAGULATION CONSULT NOTE -   Pharmacy Consult for heparin gtt/ warfarin Indication: mitral valve replacement  Allergies  Allergen Reactions  . Amoxicillin     GI upset  . Erythromycin   . Sulfonamide Derivatives     Patient Measurements: Height: 6\' 1"  (185.4 cm) Weight: 211 lb (95.7 kg) IBW/kg (Calculated) : 79.9 Heparin Dosing Weight: HEPARIN DW (KG): 95.7   Vital Signs: Temp: 98.4 F (36.9 C) (02/13 0637) Temp Source: Oral (02/13 0637) BP: 151/89 (02/13 0637) Pulse Rate: 98 (02/13 0637)  Labs: Recent Labs    05/31/19 0609 05/31/19 1133 05/31/19 1642 05/31/19 1642 06/01/19 0334 06/02/19 0209 06/02/19 0210 06/02/19 0652 06/02/19 1212  HGB 15.4   < > 12.7*   < > 11.3*  --  11.1*  --   --   HCT 49.0   < > 39.9  --  35.3*  --  34.8*  --   --   PLT 271  --   --   --  225  --  245  --   --   LABPROT 25.7*  --   --   --  17.6* 15.9*  --   --   --   INR 2.4*  --   --   --  1.5* 1.3*  --   --   --   HEPARINUNFRC  --   --   --   --   --  0.55  --  0.87* 0.86*  CREATININE 1.13  --   --   --  0.89 0.88  --   --   --    < > = values in this interval not displayed.    Estimated Creatinine Clearance: 89.5 mL/min (by C-G formula based on SCr of 0.88 mg/dL).   Medical History: Past Medical History:  Diagnosis Date  . ALLERGIC RHINITIS   . H/O mitral valve replacement    10 yrs ago  . HYPERLIPIDEMIA   . Hypertension   . Long term current use of anticoagulant   . MITRAL VALVE PROLAPSE   . PREMATURE VENTRICULAR CONTRACTIONS     Medications:  Medications Prior to Admission  Medication Sig Dispense Refill Last Dose  . cetirizine (ZYRTEC) 10 MG tablet Take 1 tablet by mouth daily.   05/30/2019 at Unknown time  . ezetimibe-simvastatin (VYTORIN) 10-20 MG tablet Take 1 tablet by mouth daily.   05/30/2019 at Unknown time  . guaiFENesin (MUCINEX) 600 MG 12 hr tablet Take 600 mg by mouth 2 (two) times daily.   05/30/2019 at Unknown time  . icosapent Ethyl (VASCEPA) 1 g  capsule Take 1 capsule by mouth 2 (two) times daily.     Marland Kitchen lisinopril (PRINIVIL,ZESTRIL) 40 MG tablet Take 40 mg by mouth daily. Reported on 07/14/2015   05/30/2019 at Unknown time  . montelukast (SINGULAIR) 10 MG tablet Take 10 mg by mouth at bedtime.   05/30/2019 at Unknown time  . nebivolol (BYSTOLIC) 5 MG tablet Take 5 mg by mouth daily.   05/30/2019 at Unknown time  . pantoprazole (PROTONIX) 40 MG tablet TAKE 1 TABLET BY MOUTH EVERY MORNING 90 tablet 3 05/30/2019 at Unknown time  . warfarin (COUMADIN) 5 MG tablet TAKE AS DIRECTED BY COUMADIN CLINIC 30 tablet 3 05/30/2019 at 2100   Scheduled:  . Chlorhexidine Gluconate Cloth  6 each Topical Daily  . ezetimibe  10 mg Oral Daily   And  . simvastatin  20 mg Oral q1800  . lisinopril  40 mg Oral Daily  .  loratadine  10 mg Oral Daily  . montelukast  10 mg Oral QHS  . nebivolol  5 mg Oral Daily  . pantoprazole (PROTONIX) IV  40 mg Intravenous Q24H  . warfarin  5 mg Oral Once  . Warfarin - Pharmacist Dosing Inpatient   Does not apply q1800   Infusions:  . 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 He was admitted for acute rectal bleed with Vitamin K 5 mg IV given on 2/11.  Patient's INR down to 1.5 and colonoscopy completed.   Home dose of warfarin listed as 2.5 mg on Tues and 5 mg  ROW.  Last dose of warfarin given 2/10 2100.  HL 0.86- supratherapeutic  Goal of Therapy:  INR 2.5-3.5 Heparin level 0.3-0.7 units/ml Monitor platelets by anticoagulation protocol: Yes   Plan:  Warfarin 5 mg x 1 dose. Decrease heparin infusion to 1300 units/hr. Check anti-Xa level in 6-8 hours and daily while on heparin. Continue to monitor H&H and platelets.   Judeth Cornfield, PharmD Clinical Pharmacist 06/02/2019 12:50 PM

## 2019-06-03 LAB — PROTIME-INR
INR: 1.4 — ABNORMAL HIGH (ref 0.8–1.2)
Prothrombin Time: 16.7 seconds — ABNORMAL HIGH (ref 11.4–15.2)

## 2019-06-03 LAB — CBC
HCT: 36.7 % — ABNORMAL LOW (ref 39.0–52.0)
Hemoglobin: 11.7 g/dL — ABNORMAL LOW (ref 13.0–17.0)
MCH: 29.5 pg (ref 26.0–34.0)
MCHC: 31.9 g/dL (ref 30.0–36.0)
MCV: 92.7 fL (ref 80.0–100.0)
Platelets: 288 10*3/uL (ref 150–400)
RBC: 3.96 MIL/uL — ABNORMAL LOW (ref 4.22–5.81)
RDW: 13.4 % (ref 11.5–15.5)
WBC: 14.7 10*3/uL — ABNORMAL HIGH (ref 4.0–10.5)
nRBC: 0 % (ref 0.0–0.2)

## 2019-06-03 LAB — HEPARIN LEVEL (UNFRACTIONATED): Heparin Unfractionated: 0.56 IU/mL (ref 0.30–0.70)

## 2019-06-03 MED ORDER — WARFARIN SODIUM 7.5 MG PO TABS: 7.5000 mg | ORAL_TABLET | Freq: Once | ORAL | Status: DC

## 2019-06-03 MED ORDER — ENOXAPARIN SODIUM 100 MG/ML ~~LOC~~ SOLN: 100.0000 mg | Freq: Two times a day (BID) | SUBCUTANEOUS | 1 refills | Status: DC

## 2019-06-03 MED ORDER — WARFARIN SODIUM 7.5 MG PO TABS: 7.5000 mg | ORAL_TABLET | Freq: Once | ORAL | 1 refills | Status: DC

## 2019-06-03 MED ORDER — ENOXAPARIN SODIUM 100 MG/ML ~~LOC~~ SOLN
100.0000 mg | Freq: Two times a day (BID) | SUBCUTANEOUS | Status: DC
  Administered 2019-06-03: 10:00:00 100 mg via SUBCUTANEOUS
  Filled 2019-06-03: qty 1

## 2019-06-03 NOTE — Discharge Summary (Signed)
Physician Discharge Summary  Luis Natal, PhD DXI:[id number] DOB: Jan 28, 1951 DOA: 05/31/2019  PCP: [name], MD  Admit date: 05/31/2019  Discharge date: 06/03/2019  Admitted From: Home  Disposition: Home  Recommendations for Outpatient Follow-up:  1. Follow up with PCP in 1-2 weeks 2. Repeat hemoglobin and hematocrit in 1 week 3. Follow-up INR levels on 2/16 as scheduled and remain on warfarin 7.5 mg daily for now as well as Lovenox 100 mg subcu twice daily until at therapeutic level 4. Follow-up with Dr. Karilyn Cota in 4-6 weeks 5. Avoid NSAIDs and aspirin-containing products 6. 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 He noticed a lot of dark red blood in the toilet bowl and had lower abdominal discomfort that he noticed prior to going to bed that he claims felt more like "indigestion." He denies any nausea or vomiting, fevers, or chills. He was mildly diaphoretic this morning, but did not have any other weakness or lightheadedness or dizziness. He took his last dose of Coumadin last night and denies taking any aspirin or NSAIDs.  2/12:Patient was admitted with acute rectal bleed that was suspected to be diverticular in nature with elevated INR. He has been seen by GI with plans for colonoscopy this afternoon. Due to  concern for diverticulitis, he was also started on ciprofloxacin and metronidazole. He is currently undergoing colon prep. His bleeding appears to have resolved at this point.  2/13: Patient had undergone colonoscopy without any active bleeding noted on 2/12.  He has been resumed on his Coumadin as well as heparin drip for bridging.  No further bleeding episodes noted overnight and he appears to be doing well.  He remains on ciprofloxacin and Flagyl per GI due to suspicion for diverticulitis.  Continue to monitor for another 24 hours and anticipate discharge in a.m. if no further bleeding episodes noted.  2/14: Patient continues to have some bowel movements with no bleeding noted.  Hemoglobin remained stable at 11.7 this morning and vitals are stable.  INR still subtherapeutic at 1.4.  He is stable for discharge today and will remain on Lovenox 100 mg subcu twice daily for bridging until his INR is therapeutic.  He will also remain on higher dose of Coumadin at 7.5 mg daily and will have further adjustments made by Tuesday of this week after he has his INR rechecked.  No other acute events otherwise noted.  Discharge Diagnoses:  Active Problems:   Lower GI bleed   Acute blood loss anemia  Principal discharge diagnosis: Acute blood loss anemia secondary to diverticular bleed in the setting of chronic anticoagulation with Coumadin.  Discharge Instructions  Discharge Instructions    Diet - low sodium heart healthy   Complete by: As directed    Increase activity slowly   Complete by: As directed      Allergies as of 06/03/2019      Reactions   Amoxicillin    GI upset   Erythromycin    Sulfonamide Derivatives  Medication List    TAKE these medications   cetirizine 10 MG tablet Commonly known as: ZYRTEC Take 1 tablet by mouth daily.   enoxaparin 100 MG/ML injection Commonly known as: LOVENOX Inject 1 mL (100 mg total) into the skin every 12 (twelve) hours for 20 days.    ezetimibe-simvastatin 10-20 MG tablet Commonly known as: VYTORIN Take 1 tablet by mouth daily.   icosapent Ethyl 1 g capsule Commonly known as: VASCEPA Take 1 capsule by mouth 2 (two) times daily.   lisinopril 40 MG tablet Commonly known as: ZESTRIL Take 40 mg by mouth daily. Reported on 07/14/2015   montelukast 10 MG tablet Commonly known as: SINGULAIR Take 10 mg by mouth at bedtime.   Mucinex 600 MG 12 hr tablet Generic drug: guaiFENesin Take 600 mg by mouth 2 (two) times daily.   nebivolol 5 MG tablet Commonly known as: BYSTOLIC Take 5 mg by mouth daily.   pantoprazole 40 MG tablet Commonly known as: PROTONIX TAKE 1 TABLET BY MOUTH EVERY MORNING   warfarin 7.5 MG tablet Commonly known as: COUMADIN Take as directed. If you are unsure how to take this medication, talk to your nurse or doctor. Original instructions: Take 1 tablet (7.5 mg total) by mouth one time only at 6 PM for 20 doses. What changed:   medication strength  how much to take  how to take this  when to take this  additional instructions      Follow-up Information    [name], MD Follow up in 1 week(s).   Specialty: Internal Medicine Contact information: [address] [place] [facility] [address] [phone]        Malissa Hippo, MD Follow up in 4 week(s).   Specialty: Gastroenterology Contact information: [address], [address] [place] [address] [phone]          Allergies  Allergen Reactions  . Amoxicillin     GI upset  . Erythromycin   . Sulfonamide Derivatives     Consultations:  GI   Procedures/Studies:  No results found.  Discharge Exam: Vitals:   06/03/19 0549 06/03/19 0907  BP: (!) 131/97 128/87  Pulse: 91   Resp: 17   Temp: 99.6 F (37.6 C)   SpO2: 93%    Vitals:   06/02/19 2106 06/02/19 2230 06/03/19 0549 06/03/19 0907  BP: (!) 133/92  (!) 131/97 128/87  Pulse: (!) 101  91   Resp: 18  17   Temp:  100.1 F (37.8 C) 99.6 F  (37.6 C)   TempSrc:  Oral Oral   SpO2: 96%  93%   Weight:      Height:        General: Pt is alert, awake, not in acute distress Cardiovascular: RRR, S1/S2 +, no rubs, no gallops Respiratory: CTA bilaterally, no wheezing, no rhonchi Abdominal: Soft, NT, ND, bowel sounds + Extremities: no edema, no cyanosis    The results of significant diagnostics from this hospitalization (including imaging, microbiology, ancillary and laboratory) are listed below for reference.     Microbiology: Recent Results (from the past 240 hour(s))  SARS CORONAVIRUS 2 (TAT 6-24 HRS) Nasopharyngeal Nasopharyngeal Swab     Status: None   Collection Time: 05/31/19  6:30 AM   Specimen: Nasopharyngeal Swab  Result Value Ref Range Status   SARS Coronavirus 2 NEGATIVE NEGATIVE Final    Comment: (NOTE) SARS-CoV-2 target nucleic acids are NOT DETECTED. The SARS-CoV-2 RNA is generally detectable in upper and  lower respiratory specimens during the acute phase of infection. Negative results do not preclude SARS-CoV-2 infection, do not rule out co-infections with other pathogens, and should not be used as the sole basis for treatment or other patient management decisions. Negative results must be combined with clinical observations, patient history, and epidemiological information. The expected result is Negative. Fact Sheet for Patients: SugarRoll.be Fact Sheet for Healthcare Providers: https://www.woods-mathews.com/ This test is not yet approved or cleared by the [place] FDA and  has been authorized for detection and/or diagnosis of SARS-CoV-2 by FDA under an Emergency Use Authorization (EUA). This EUA will remain  in effect (meaning this test can be used) for the duration of the COVID-19 declaration under Section 56 4(b)(1) of the Act, 21 U.S.C. section 360bbb-3(b)(1), unless the authorization is terminated or revoked sooner. Performed at [facility] [place], [place] [address], [place], [place] [address]   MRSA PCR Screening     Status: None   Collection Time: 05/31/19  3:16 PM   Specimen: Nasopharyngeal  Result Value Ref Range Status   MRSA by PCR NEGATIVE NEGATIVE Final    Comment:        The GeneXpert MRSA Assay (FDA approved for NASAL specimens only), is one component of a comprehensive MRSA colonization surveillance program. It is not intended to diagnose MRSA infection nor to guide or monitor treatment for MRSA infections. Performed at [facility], [address], [place], [place] [address]      Labs: BNP (last 3 results) No results for input(s): BNP in the last 8760 hours. Basic Metabolic Panel: Recent Labs  Lab 05/31/19 0609 06/01/19 0334 06/02/19 0209  NA 141 138 139  K 3.8 3.9 4.1  CL 108 105 103  CO2 28 25 27   GLUCOSE 134* 135* 122*  BUN 28* 19 12  CREATININE 1.13 0.89 0.88  CALCIUM 9.3 8.4* 8.7*   Liver Function Tests: Recent Labs  Lab 06/01/19 0334  AST 16  ALT 15  ALKPHOS 27*  BILITOT 0.7  PROT 5.4*  ALBUMIN 3.2*   No results for input(s): LIPASE, AMYLASE in the last 168 hours. No results for input(s): AMMONIA in the last 168 hours. CBC: Recent Labs  Lab 05/31/19 0609 05/31/19 0609 05/31/19 1133 05/31/19 1642 06/01/19 0334 06/02/19 0210 06/03/19 0701  WBC 7.4  --   --   --  8.1 10.7* 14.7*  NEUTROABS 3.8  --   --   --   --   --   --   HGB 15.4   < > 13.5 12.7* 11.3* 11.1* 11.7*  HCT 49.0   < > 42.7 39.9 35.3* 34.8* 36.7*  MCV 94.0  --   --   --  93.4 93.8 92.7  PLT 271  --   --   --  225 245 288   < > = values in this interval not displayed.   Cardiac Enzymes: No results for input(s): CKTOTAL, CKMB, CKMBINDEX, TROPONINI in the last 168 hours. BNP: Invalid input(s): POCBNP CBG: No results for input(s): GLUCAP in the last 168 hours. D-Dimer No results for input(s): DDIMER in the last 72 hours. Hgb A1c No results for input(s): HGBA1C in the last 72 hours. Lipid Profile No results  for input(s): CHOL, HDL, LDLCALC, TRIG, CHOLHDL, LDLDIRECT in the last 72 hours. Thyroid function studies No results for input(s): TSH, T4TOTAL, T3FREE, THYROIDAB in the last 72 hours.  Invalid input(s): FREET3 Anemia work up No results for input(s): VITAMINB12, FOLATE, FERRITIN, TIBC, IRON,  RETICCTPCT in the last 72 hours. Urinalysis    Component Value Date/Time   COLORURINE yellow 10/25/2007 0859   APPEARANCEUR Clear 10/25/2007 0859   LABSPEC 1.025 10/25/2007 0859   PHURINE 5.0 10/25/2007 0859   HGBUR trace-lysed 10/25/2007 0859   BILIRUBINUR negative 10/25/2007 0859   UROBILINOGEN 0.2 10/25/2007 0859   NITRITE negative 10/25/2007 0859   Sepsis Labs Invalid input(s): PROCALCITONIN,  WBC,  LACTICIDVEN Microbiology Recent Results (from the past 240 hour(s))  SARS CORONAVIRUS 2 (TAT 6-24 HRS) Nasopharyngeal Nasopharyngeal Swab     Status: None   Collection Time: 05/31/19  6:30 AM   Specimen: Nasopharyngeal Swab  Result Value Ref Range Status   SARS Coronavirus 2 NEGATIVE NEGATIVE Final    Comment: (NOTE) SARS-CoV-2 target nucleic acids are NOT DETECTED. The SARS-CoV-2 RNA is generally detectable in upper and lower respiratory specimens during the acute phase of infection. Negative results do not preclude SARS-CoV-2 infection, do not rule out co-infections with other pathogens, and should not be used as the sole basis for treatment or other patient management decisions. Negative results must be combined with clinical observations, patient history, and epidemiological information. The expected result is Negative. Fact Sheet for Patients: HairSlick.no Fact Sheet for Healthcare Providers: quierodirigir.com This test is not yet approved or cleared by the [place] FDA and  has been authorized for detection and/or diagnosis of SARS-CoV-2 by FDA under an Emergency Use Authorization (EUA). This EUA will remain  in effect  (meaning this test can be used) for the duration of the COVID-19 declaration under Section 56 4(b)(1) of the Act, 21 U.S.C. section 360bbb-3(b)(1), unless the authorization is terminated or revoked sooner. Performed at [facility], [address], [place], [place] [address]   MRSA PCR Screening     Status: None   Collection Time: 05/31/19  3:16 PM   Specimen: Nasopharyngeal  Result Value Ref Range Status   MRSA by PCR NEGATIVE NEGATIVE Final    Comment:        The GeneXpert MRSA Assay (FDA approved for NASAL specimens only), is one component of a comprehensive MRSA colonization surveillance program. It is not intended to diagnose MRSA infection nor to guide or monitor treatment for MRSA infections. Performed at [facility], [address], [place], [place] [address]      Time coordinating discharge: 35 minutes  SIGNED:   Erick Blinks, DO [facility] 06/03/2019, 9:34 AM  If 7PM-7AM, please contact night-coverage [email or web address]

## 2019-06-03 NOTE — Discharge Planning (Addendum)
Patient IV removed.  RN assessment and VS revealed stability for DC to home.  Heparin drip stopped, Lovenox given given prior to DC  and sent home on coumadin.  Discharge papers given, explained and educated.  Informed of suggested FU with PCP and patient agrees to call and set up since discharging on Sunday.  Script sent to [employer], per patient request.  Once ready, will be wheeled to front and family transporting home via car.

## 2019-06-03 NOTE — Progress Notes (Signed)
ANTICOAGULATION CONSULT NOTE -   Pharmacy Consult for heparin gtt/ warfarin Indication: mitral valve replacement  Allergies  Allergen Reactions  . Amoxicillin     GI upset  . Erythromycin   . Sulfonamide Derivatives     Patient Measurements: Height: 6\' 1"  (185.4 cm) Weight: 211 lb (95.7 kg) IBW/kg (Calculated) : 79.9 Heparin Dosing Weight: HEPARIN DW (KG): 95.7   Vital Signs: Temp: 99.6 F (37.6 C) (02/14 0549) Temp Source: Oral (02/14 0549) BP: 131/97 (02/14 0549) Pulse Rate: 91 (02/14 0549)  Labs: Recent Labs    06/01/19 0334 06/01/19 0334 06/02/19 0209 06/02/19 0210 06/02/19 0652 06/02/19 1212 06/02/19 2033 06/03/19 0701  HGB 11.3*   < >  --  11.1*  --   --   --  11.7*  HCT 35.3*  --   --  34.8*  --   --   --  36.7*  PLT 225  --   --  245  --   --   --  288  LABPROT 17.6*  --  15.9*  --   --   --   --  16.7*  INR 1.5*  --  1.3*  --   --   --   --  1.4*  HEPARINUNFRC  --   --  0.55  --    < > 0.86* 0.55 0.56  CREATININE 0.89  --  0.88  --   --   --   --   --    < > = values in this interval not displayed.    Estimated Creatinine Clearance: 89.5 mL/min (by C-G formula based on SCr of 0.88 mg/dL).   Medical History: Past Medical History:  Diagnosis Date  . ALLERGIC RHINITIS   . H/O mitral valve replacement    10 yrs ago  . HYPERLIPIDEMIA   . Hypertension   . Long term current use of anticoagulant   . MITRAL VALVE PROLAPSE   . PREMATURE VENTRICULAR CONTRACTIONS     Medications:  Medications Prior to Admission  Medication Sig Dispense Refill Last Dose  . cetirizine (ZYRTEC) 10 MG tablet Take 1 tablet by mouth daily.   05/30/2019 at Unknown time  . ezetimibe-simvastatin (VYTORIN) 10-20 MG tablet Take 1 tablet by mouth daily.   05/30/2019 at Unknown time  . guaiFENesin (MUCINEX) 600 MG 12 hr tablet Take 600 mg by mouth 2 (two) times daily.   05/30/2019 at Unknown time  . icosapent Ethyl (VASCEPA) 1 g capsule Take 1 capsule by mouth 2 (two) times daily.      Marland Kitchen lisinopril (PRINIVIL,ZESTRIL) 40 MG tablet Take 40 mg by mouth daily. Reported on 07/14/2015   05/30/2019 at Unknown time  . montelukast (SINGULAIR) 10 MG tablet Take 10 mg by mouth at bedtime.   05/30/2019 at Unknown time  . nebivolol (BYSTOLIC) 5 MG tablet Take 5 mg by mouth daily.   05/30/2019 at Unknown time  . pantoprazole (PROTONIX) 40 MG tablet TAKE 1 TABLET BY MOUTH EVERY MORNING 90 tablet 3 05/30/2019 at Unknown time  . warfarin (COUMADIN) 5 MG tablet TAKE AS DIRECTED BY COUMADIN CLINIC 30 tablet 3 05/30/2019 at 2100   Scheduled:  . Chlorhexidine Gluconate Cloth  6 each Topical Daily  . ezetimibe  10 mg Oral Daily   And  . simvastatin  20 mg Oral q1800  . lisinopril  40 mg Oral Daily  . loratadine  10 mg Oral Daily  . montelukast  10 mg Oral QHS  . nebivolol  5  mg Oral Daily  . pantoprazole  40 mg Oral QAC breakfast  . Warfarin - Pharmacist Dosing Inpatient   Does not apply q1800   Infusions:  . heparin 1,300 Units/hr (06/03/19 0544)   PRN: acetaminophen **OR** acetaminophen, labetalol, ondansetron **OR** ondansetron (ZOFRAN) IV Anti-infectives (From admission, onward)   Start     Dose/Rate Route Frequency Ordered Stop   05/31/19 1245  ciprofloxacin (CIPRO) IVPB 400 mg  Status:  Discontinued     400 mg 200 mL/hr over 60 Minutes Intravenous Every 12 hours 05/31/19 1241 06/01/19 1440   05/31/19 1245  metroNIDAZOLE (FLAGYL) IVPB 500 mg  Status:  Discontinued     500 mg 100 mL/hr over 60 Minutes Intravenous Every 8 hours 05/31/19 1241 06/01/19 1440      Assessment: Luis Natal, PhD a 69 y.o. male requires anticoagulation with a heparin iv infusion for the indication mitral valve replacement. He was admitted for acute rectal bleed with Vitamin K 5 mg IV given on 2/11.  Patient's INR down to 1.5 and colonoscopy completed.   Home dose of warfarin listed as 2.5 mg on Tues and 5 mg ROW.  Last dose of warfarin given 2/10 2100.  HL 0.56- therapeutic INR 1.3 >>1.4  Goal of  Therapy:  INR 2.5-3.5 Heparin level 0.3-0.7 units/ml Monitor platelets by anticoagulation protocol: Yes   Plan:  Warfarin 7.5 mg x 1 dose. Continue heparin infusion at 1300 units/hr. Check anti-Xa level  daily while on heparin. Continue to monitor H&H and platelets.   Judeth Cornfield, PharmD Clinical Pharmacist 06/03/2019 8:33 AM

## 2019-06-05 ENCOUNTER — Ambulatory Visit (INDEPENDENT_AMBULATORY_CARE_PROVIDER_SITE_OTHER): Payer: BC Managed Care – PPO | Admitting: *Deleted

## 2019-06-05 ENCOUNTER — Other Ambulatory Visit: Payer: Self-pay

## 2019-06-05 DIAGNOSIS — Z5181 Encounter for therapeutic drug level monitoring: Secondary | ICD-10-CM

## 2019-06-05 DIAGNOSIS — Z952 Presence of prosthetic heart valve: Secondary | ICD-10-CM | POA: Diagnosis not present

## 2019-06-05 DIAGNOSIS — I059 Rheumatic mitral valve disease, unspecified: Secondary | ICD-10-CM | POA: Diagnosis not present

## 2019-06-05 DIAGNOSIS — Z7901 Long term (current) use of anticoagulants: Secondary | ICD-10-CM | POA: Diagnosis not present

## 2019-06-05 LAB — POCT INR: INR: 1.9 — AB (ref 2.0–3.0)

## 2019-06-05 NOTE — Patient Instructions (Signed)
In [facility] [place] 2/11 - 2/14 for diverticular bleed.  Warfarin reversed with Vit K for colonoscopy. Restarted warfarin 7.5mg  on 2/12 - 2/15 along with Lovenox 100mg  SQ BID per d/c instructions Take warfarin 10mg  tonight, 7.5mg  tomorrow night and recheck INR on Thursday Continue Lovenox 100mg  twice daily

## 2019-06-06 ENCOUNTER — Telehealth: Payer: Self-pay | Admitting: *Deleted

## 2019-06-06 NOTE — Telephone Encounter (Signed)
Called patient.  He will take warfarin 7.5mg  tonight then satke 5mg  daily until INR check on Monday 06/11/19.  He will continue Lovenox 100mg  sq twice daily through Friday 2/19 then stop.  He verbalized understanding.

## 2019-06-06 NOTE — Telephone Encounter (Signed)
Has questions about what to do with his medication until Monday

## 2019-06-11 ENCOUNTER — Ambulatory Visit (INDEPENDENT_AMBULATORY_CARE_PROVIDER_SITE_OTHER): Payer: BC Managed Care – PPO | Admitting: *Deleted

## 2019-06-11 ENCOUNTER — Other Ambulatory Visit: Payer: Self-pay

## 2019-06-11 DIAGNOSIS — Z952 Presence of prosthetic heart valve: Secondary | ICD-10-CM | POA: Diagnosis not present

## 2019-06-11 DIAGNOSIS — Z5181 Encounter for therapeutic drug level monitoring: Secondary | ICD-10-CM | POA: Diagnosis not present

## 2019-06-11 DIAGNOSIS — I059 Rheumatic mitral valve disease, unspecified: Secondary | ICD-10-CM

## 2019-06-11 DIAGNOSIS — Z7901 Long term (current) use of anticoagulants: Secondary | ICD-10-CM

## 2019-06-11 LAB — POCT INR: INR: 3 (ref 2.0–3.0)

## 2019-06-11 NOTE — Patient Instructions (Signed)
Continue warfarin 1 tablet daily except 1/2 tablet on Tuesdays Recheck in 3 wks

## 2019-06-19 ENCOUNTER — Ambulatory Visit: Payer: BC Managed Care – PPO

## 2019-07-02 ENCOUNTER — Other Ambulatory Visit: Payer: Self-pay

## 2019-07-02 ENCOUNTER — Ambulatory Visit (INDEPENDENT_AMBULATORY_CARE_PROVIDER_SITE_OTHER): Payer: BC Managed Care – PPO | Admitting: *Deleted

## 2019-07-02 DIAGNOSIS — Z7901 Long term (current) use of anticoagulants: Secondary | ICD-10-CM | POA: Diagnosis not present

## 2019-07-02 DIAGNOSIS — Z952 Presence of prosthetic heart valve: Secondary | ICD-10-CM | POA: Diagnosis not present

## 2019-07-02 DIAGNOSIS — Z5181 Encounter for therapeutic drug level monitoring: Secondary | ICD-10-CM | POA: Diagnosis not present

## 2019-07-02 DIAGNOSIS — I059 Rheumatic mitral valve disease, unspecified: Secondary | ICD-10-CM | POA: Diagnosis not present

## 2019-07-02 LAB — POCT INR: INR: 1.9 — AB (ref 2.0–3.0)

## 2019-07-02 NOTE — Patient Instructions (Signed)
Take warfarin 2 tablets tonight then increase dose to 1 tablet daily. Recheck in 2 weeks  

## 2019-07-16 ENCOUNTER — Other Ambulatory Visit: Payer: Self-pay

## 2019-07-16 ENCOUNTER — Ambulatory Visit (INDEPENDENT_AMBULATORY_CARE_PROVIDER_SITE_OTHER): Payer: BC Managed Care – PPO | Admitting: *Deleted

## 2019-07-16 DIAGNOSIS — I059 Rheumatic mitral valve disease, unspecified: Secondary | ICD-10-CM | POA: Diagnosis not present

## 2019-07-16 DIAGNOSIS — Z7901 Long term (current) use of anticoagulants: Secondary | ICD-10-CM

## 2019-07-16 DIAGNOSIS — Z5181 Encounter for therapeutic drug level monitoring: Secondary | ICD-10-CM

## 2019-07-16 DIAGNOSIS — Z952 Presence of prosthetic heart valve: Secondary | ICD-10-CM

## 2019-07-16 LAB — POCT INR: INR: 2.2 (ref 2.0–3.0)

## 2019-07-16 NOTE — Patient Instructions (Signed)
Take warfarin 2 tablets tonight then increase dose to 1 tablet daily except 1 1/2 tablets on Mondays Recheck in 2.5 weeks

## 2019-08-02 ENCOUNTER — Ambulatory Visit (INDEPENDENT_AMBULATORY_CARE_PROVIDER_SITE_OTHER): Payer: BC Managed Care – PPO | Admitting: *Deleted

## 2019-08-02 ENCOUNTER — Other Ambulatory Visit: Payer: Self-pay

## 2019-08-02 DIAGNOSIS — Z5181 Encounter for therapeutic drug level monitoring: Secondary | ICD-10-CM | POA: Diagnosis not present

## 2019-08-02 DIAGNOSIS — Z7901 Long term (current) use of anticoagulants: Secondary | ICD-10-CM

## 2019-08-02 DIAGNOSIS — Z952 Presence of prosthetic heart valve: Secondary | ICD-10-CM

## 2019-08-02 DIAGNOSIS — I059 Rheumatic mitral valve disease, unspecified: Secondary | ICD-10-CM

## 2019-08-02 LAB — POCT INR: INR: 2.6 (ref 2.0–3.0)

## 2019-08-02 NOTE — Patient Instructions (Signed)
Continue warfarin 1 tablet daily except 1 1/2 tablets on Mondays  Recheck in 4 weeks   

## 2019-08-30 ENCOUNTER — Ambulatory Visit (INDEPENDENT_AMBULATORY_CARE_PROVIDER_SITE_OTHER): Payer: BC Managed Care – PPO | Admitting: *Deleted

## 2019-08-30 ENCOUNTER — Other Ambulatory Visit: Payer: Self-pay

## 2019-08-30 DIAGNOSIS — I059 Rheumatic mitral valve disease, unspecified: Secondary | ICD-10-CM | POA: Diagnosis not present

## 2019-08-30 DIAGNOSIS — Z952 Presence of prosthetic heart valve: Secondary | ICD-10-CM

## 2019-08-30 DIAGNOSIS — Z5181 Encounter for therapeutic drug level monitoring: Secondary | ICD-10-CM | POA: Diagnosis not present

## 2019-08-30 DIAGNOSIS — Z7901 Long term (current) use of anticoagulants: Secondary | ICD-10-CM

## 2019-08-30 LAB — POCT INR: INR: 3.2 — AB (ref 2.0–3.0)

## 2019-08-30 NOTE — Patient Instructions (Signed)
Continue warfarin 1 tablet daily except 1 1/2 tablets on Mondays Recheck in 5 weeks

## 2019-09-03 ENCOUNTER — Other Ambulatory Visit: Payer: Self-pay

## 2019-09-03 ENCOUNTER — Other Ambulatory Visit: Payer: Self-pay | Admitting: *Deleted

## 2019-09-03 MED ORDER — WARFARIN SODIUM 5 MG PO TABS: ORAL_TABLET | ORAL | 6 refills | Status: DC

## 2019-09-03 NOTE — Telephone Encounter (Signed)
This is a [place] pt.  °

## 2019-10-01 ENCOUNTER — Other Ambulatory Visit (INDEPENDENT_AMBULATORY_CARE_PROVIDER_SITE_OTHER): Payer: Self-pay | Admitting: Gastroenterology

## 2019-10-01 MED ORDER — PANTOPRAZOLE SODIUM 40 MG PO TBEC: 40.0000 mg | DELAYED_RELEASE_TABLET | Freq: Every morning | ORAL | 1 refills | Status: DC

## 2019-10-01 NOTE — Progress Notes (Signed)
Received refill request, refill sent to pharmacy.

## 2019-10-04 ENCOUNTER — Ambulatory Visit (INDEPENDENT_AMBULATORY_CARE_PROVIDER_SITE_OTHER): Payer: BC Managed Care – PPO | Admitting: *Deleted

## 2019-10-04 DIAGNOSIS — I059 Rheumatic mitral valve disease, unspecified: Secondary | ICD-10-CM

## 2019-10-04 DIAGNOSIS — Z7901 Long term (current) use of anticoagulants: Secondary | ICD-10-CM

## 2019-10-04 DIAGNOSIS — Z952 Presence of prosthetic heart valve: Secondary | ICD-10-CM

## 2019-10-04 DIAGNOSIS — Z5181 Encounter for therapeutic drug level monitoring: Secondary | ICD-10-CM | POA: Diagnosis not present

## 2019-10-04 LAB — POCT INR: INR: 2.8 (ref 2.0–3.0)

## 2019-10-04 NOTE — Patient Instructions (Signed)
Continue warfarin 1 tablet daily except 1 1/2 tablets on Mondays  Recheck in 6 weeks 

## 2019-11-15 ENCOUNTER — Ambulatory Visit (INDEPENDENT_AMBULATORY_CARE_PROVIDER_SITE_OTHER): Payer: BC Managed Care – PPO | Admitting: *Deleted

## 2019-11-15 DIAGNOSIS — I059 Rheumatic mitral valve disease, unspecified: Secondary | ICD-10-CM | POA: Diagnosis not present

## 2019-11-15 DIAGNOSIS — Z7901 Long term (current) use of anticoagulants: Secondary | ICD-10-CM | POA: Diagnosis not present

## 2019-11-15 DIAGNOSIS — Z5181 Encounter for therapeutic drug level monitoring: Secondary | ICD-10-CM

## 2019-11-15 DIAGNOSIS — Z952 Presence of prosthetic heart valve: Secondary | ICD-10-CM | POA: Diagnosis not present

## 2019-11-15 LAB — POCT INR: INR: 4.2 — AB (ref 2.0–3.0)

## 2019-11-15 NOTE — Patient Instructions (Signed)
Continue warfarin 1 tablet daily except 1 1/2 tablets on Mondays  Recheck in 6 weeks 

## 2019-11-29 ENCOUNTER — Encounter (INDEPENDENT_AMBULATORY_CARE_PROVIDER_SITE_OTHER): Payer: Self-pay

## 2019-12-04 ENCOUNTER — Ambulatory Visit (INDEPENDENT_AMBULATORY_CARE_PROVIDER_SITE_OTHER): Payer: BC Managed Care – PPO | Admitting: Internal Medicine

## 2019-12-13 ENCOUNTER — Ambulatory Visit (INDEPENDENT_AMBULATORY_CARE_PROVIDER_SITE_OTHER): Payer: BC Managed Care – PPO | Admitting: *Deleted

## 2019-12-13 DIAGNOSIS — Z5181 Encounter for therapeutic drug level monitoring: Secondary | ICD-10-CM

## 2019-12-13 DIAGNOSIS — I059 Rheumatic mitral valve disease, unspecified: Secondary | ICD-10-CM | POA: Diagnosis not present

## 2019-12-13 DIAGNOSIS — Z952 Presence of prosthetic heart valve: Secondary | ICD-10-CM | POA: Diagnosis not present

## 2019-12-13 DIAGNOSIS — Z7901 Long term (current) use of anticoagulants: Secondary | ICD-10-CM

## 2019-12-13 LAB — POCT INR: INR: 5 — AB (ref 2.0–3.0)

## 2019-12-13 NOTE — Patient Instructions (Signed)
Hold warfarin tonight, take 1/2 tablet tomorrow night then decrease dose to 1 tablet daily except 1/2 tablets on Mondays Recheck in 3 weeks

## 2019-12-17 ENCOUNTER — Ambulatory Visit (INDEPENDENT_AMBULATORY_CARE_PROVIDER_SITE_OTHER): Payer: BC Managed Care – PPO | Admitting: Internal Medicine

## 2020-01-02 ENCOUNTER — Ambulatory Visit (INDEPENDENT_AMBULATORY_CARE_PROVIDER_SITE_OTHER): Payer: BC Managed Care – PPO | Admitting: *Deleted

## 2020-01-02 ENCOUNTER — Other Ambulatory Visit: Payer: Self-pay

## 2020-01-02 DIAGNOSIS — Z7901 Long term (current) use of anticoagulants: Secondary | ICD-10-CM | POA: Diagnosis not present

## 2020-01-02 DIAGNOSIS — Z952 Presence of prosthetic heart valve: Secondary | ICD-10-CM | POA: Diagnosis not present

## 2020-01-02 DIAGNOSIS — Z5181 Encounter for therapeutic drug level monitoring: Secondary | ICD-10-CM

## 2020-01-02 DIAGNOSIS — I059 Rheumatic mitral valve disease, unspecified: Secondary | ICD-10-CM | POA: Diagnosis not present

## 2020-01-02 LAB — POCT INR: INR: 2.8 (ref 2.0–3.0)

## 2020-01-02 NOTE — Patient Instructions (Signed)
Continue warfarin 1 tablet daily except 1/2 tablets on Mondays Recheck in 3 weeks

## 2020-01-24 ENCOUNTER — Ambulatory Visit (INDEPENDENT_AMBULATORY_CARE_PROVIDER_SITE_OTHER): Payer: BC Managed Care – PPO | Admitting: *Deleted

## 2020-01-24 DIAGNOSIS — Z5181 Encounter for therapeutic drug level monitoring: Secondary | ICD-10-CM

## 2020-01-24 DIAGNOSIS — Z7901 Long term (current) use of anticoagulants: Secondary | ICD-10-CM

## 2020-01-24 DIAGNOSIS — I059 Rheumatic mitral valve disease, unspecified: Secondary | ICD-10-CM | POA: Diagnosis not present

## 2020-01-24 DIAGNOSIS — Z952 Presence of prosthetic heart valve: Secondary | ICD-10-CM | POA: Diagnosis not present

## 2020-01-24 LAB — POCT INR: INR: 3.6 — AB (ref 2.0–3.0)

## 2020-01-24 NOTE — Patient Instructions (Signed)
Take 1/2 tablet tonight then resume 1 tablet daily except 1/2 tablets on Mondays Recheck in 4 weeks

## 2020-03-17 ENCOUNTER — Ambulatory Visit (INDEPENDENT_AMBULATORY_CARE_PROVIDER_SITE_OTHER): Payer: BC Managed Care – PPO | Admitting: *Deleted

## 2020-03-17 ENCOUNTER — Other Ambulatory Visit: Payer: Self-pay

## 2020-03-17 DIAGNOSIS — Z7901 Long term (current) use of anticoagulants: Secondary | ICD-10-CM | POA: Diagnosis not present

## 2020-03-17 DIAGNOSIS — Z5181 Encounter for therapeutic drug level monitoring: Secondary | ICD-10-CM

## 2020-03-17 DIAGNOSIS — Z952 Presence of prosthetic heart valve: Secondary | ICD-10-CM | POA: Diagnosis not present

## 2020-03-17 DIAGNOSIS — I059 Rheumatic mitral valve disease, unspecified: Secondary | ICD-10-CM

## 2020-03-17 LAB — POCT INR: INR: 4.9 — AB (ref 2.0–3.0)

## 2020-03-17 NOTE — Patient Instructions (Signed)
Hold tonight, take 1/2 tablet tomorrow night then decrease dose to 1 tablet daily except 1/2 tablets on Mondays and Thursdays Recheck in 2 weeks

## 2020-03-21 ENCOUNTER — Ambulatory Visit (INDEPENDENT_AMBULATORY_CARE_PROVIDER_SITE_OTHER): Payer: BC Managed Care – PPO | Admitting: Podiatry

## 2020-03-21 ENCOUNTER — Other Ambulatory Visit: Payer: Self-pay | Admitting: Podiatry

## 2020-03-21 ENCOUNTER — Encounter: Payer: Self-pay | Admitting: Podiatry

## 2020-03-21 ENCOUNTER — Ambulatory Visit (INDEPENDENT_AMBULATORY_CARE_PROVIDER_SITE_OTHER): Payer: BC Managed Care – PPO

## 2020-03-21 ENCOUNTER — Other Ambulatory Visit: Payer: Self-pay

## 2020-03-21 DIAGNOSIS — R200 Anesthesia of skin: Secondary | ICD-10-CM | POA: Diagnosis not present

## 2020-03-21 DIAGNOSIS — G629 Polyneuropathy, unspecified: Secondary | ICD-10-CM

## 2020-03-21 DIAGNOSIS — R52 Pain, unspecified: Secondary | ICD-10-CM

## 2020-03-21 DIAGNOSIS — R202 Paresthesia of skin: Secondary | ICD-10-CM

## 2020-03-24 ENCOUNTER — Encounter: Payer: Self-pay | Admitting: Podiatry

## 2020-03-25 ENCOUNTER — Encounter: Payer: Self-pay | Admitting: Podiatry

## 2020-03-25 NOTE — Progress Notes (Signed)
Subjective:  Patient ID: Luis Natal, PhD, male    DOB: November 08, 1950,  MRN: [id number]  Chief Complaint  Patient presents with  . Foot Pain    PT STATES THAT HE HAS BILATERAL BURNING SENSATION IN FEET THAT TRAVEL UP TO LEG AS WELL AS SWOLLEN ANKLES      69 y.o. male presents with the above complaint.  Patient presents with complaint of numbness tingling to both lower extremities with left greater than right side.  Patient states the numbness and burning sensation traveling up the leg.  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  Patient has steroid injection in the past as well.  Patient also feels cold sometimes.  He would like to make sure that there is nothing else going on.  He does not have diabetes.  He denies seeing anyone else prior to see me.  He denies any other acute complaints.   Review of Systems: Negative except as noted in the HPI. Denies N/V/F/Ch.  Past Medical History:  Diagnosis Date  . ALLERGIC RHINITIS   . H/O mitral valve replacement    10 yrs ago  . HYPERLIPIDEMIA   . Hypertension   . Long term current use of anticoagulant   . MITRAL VALVE PROLAPSE   . PREMATURE VENTRICULAR CONTRACTIONS     Current Outpatient Medications:  .  cetirizine (ZYRTEC) 10 MG tablet, Take 1 tablet by mouth daily., Disp: , Rfl:  .  ezetimibe-simvastatin (VYTORIN) 10-20 MG tablet, Take 1 tablet by mouth daily., Disp: , Rfl:  .  guaiFENesin (MUCINEX) 600 MG 12 hr tablet, Take 600 mg by mouth 2 (two) times daily., Disp: , Rfl:  .  icosapent Ethyl (VASCEPA) 1 g capsule, Take 1 capsule by mouth 2 (two) times daily., Disp: , Rfl:  .  lisinopril (PRINIVIL,ZESTRIL) 40 MG tablet, Take 40 mg by mouth daily. Reported on 07/14/2015, Disp: , Rfl:  .  montelukast (SINGULAIR) 10 MG tablet, Take 10 mg by mouth at bedtime., Disp: , Rfl:  .  nebivolol (BYSTOLIC) 5 MG tablet, Take 5 mg by mouth daily., Disp: , Rfl:  .  pantoprazole (PROTONIX) 40 MG  tablet, Take 1 tablet (40 mg total) by mouth every morning., Disp: 90 tablet, Rfl: 1 .  warfarin (COUMADIN) 5 MG tablet, Take 1 tablet daily except 1 1/2 tablets on Mondays or as directed, Disp: 45 tablet, Rfl: 6  Social History   Tobacco Use  Smoking Status Never Smoker  Smokeless Tobacco Never Used    Allergies  Allergen Reactions  . Amoxicillin     GI upset  . Erythromycin   . Sulfonamide Derivatives    Objective:  There were no vitals filed for this visit. There is no height or weight on file to calculate BMI. Constitutional Well developed. Well nourished.  Vascular Dorsalis pedis pulses palpable bilaterally. Posterior tibial pulses palpable bilaterally. Capillary refill normal to all digits.  No cyanosis or clubbing noted. Pedal hair growth normal.  Neurologic Normal speech. Oriented to person, place, and time. Mildly epicritic sensation to light touch grossly present bilaterally.  Subjective complaining of numbness tingling noted to the digits.  Dermatologic Nails well groomed and normal in appearance. No open wounds. No skin lesions.  Orthopedic: Manual muscle strength was 5 out of 5 bilaterally.   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  Midfoot arthritis noted bilaterally as well.  Cavus foot structure noted.  Vessel calcification noted as well.  No other bony abnormalities identified.  Assessment:   1. Numbness and tingling    Plan:  Patient was evaluated and treated and all questions answered.  Numbness and tingling to the digits bilaterally with left greater than right side -I explained to the patient the etiology of tingling and various treatment options were discussed.  Patient is not a diabetic.  This may likely be attributed to another source as not involving the foot and leg.  Discussed this with the patient  in extensive detail.  For now patient is managing it fine and does not need any medication to help with it.  I will continue to monitor it over time.  Patient states understanding at this time there is nothing to do as this is very mild case.  No follow-ups on file.

## 2020-04-01 ENCOUNTER — Ambulatory Visit (INDEPENDENT_AMBULATORY_CARE_PROVIDER_SITE_OTHER): Payer: BC Managed Care – PPO | Admitting: *Deleted

## 2020-04-01 DIAGNOSIS — I059 Rheumatic mitral valve disease, unspecified: Secondary | ICD-10-CM

## 2020-04-01 DIAGNOSIS — Z952 Presence of prosthetic heart valve: Secondary | ICD-10-CM | POA: Diagnosis not present

## 2020-04-01 DIAGNOSIS — Z7901 Long term (current) use of anticoagulants: Secondary | ICD-10-CM

## 2020-04-01 DIAGNOSIS — Z5181 Encounter for therapeutic drug level monitoring: Secondary | ICD-10-CM

## 2020-04-01 LAB — POCT INR: INR: 4.4 — AB (ref 2.0–3.0)

## 2020-04-01 NOTE — Patient Instructions (Signed)
Hold tonight then decrease dose to 1 tablet daily except 1/2 tablet on Mondays, Wednesdays and Frdiays Recheck in 2 weeks

## 2020-04-09 ENCOUNTER — Other Ambulatory Visit (INDEPENDENT_AMBULATORY_CARE_PROVIDER_SITE_OTHER): Payer: Self-pay

## 2020-04-09 ENCOUNTER — Telehealth (INDEPENDENT_AMBULATORY_CARE_PROVIDER_SITE_OTHER): Payer: Self-pay | Admitting: Internal Medicine

## 2020-04-09 MED ORDER — PANTOPRAZOLE SODIUM 40 MG PO TBEC: 40.0000 mg | DELAYED_RELEASE_TABLET | Freq: Every morning | ORAL | 1 refills | Status: DC

## 2020-04-09 NOTE — Telephone Encounter (Signed)
Left the patient a message that we have sent this medication into his pharmacy.

## 2020-04-09 NOTE — Telephone Encounter (Signed)
Patient left voice mail message stating he had tried to get his pantoprazole refilled - stated his pharmacy stated it was not approved by provider - he wants to know why - please advise - ph# [phone]

## 2020-04-14 ENCOUNTER — Ambulatory Visit (INDEPENDENT_AMBULATORY_CARE_PROVIDER_SITE_OTHER): Payer: BC Managed Care – PPO | Admitting: *Deleted

## 2020-04-14 ENCOUNTER — Other Ambulatory Visit: Payer: Self-pay

## 2020-04-14 DIAGNOSIS — Z952 Presence of prosthetic heart valve: Secondary | ICD-10-CM | POA: Diagnosis not present

## 2020-04-14 DIAGNOSIS — Z5181 Encounter for therapeutic drug level monitoring: Secondary | ICD-10-CM | POA: Diagnosis not present

## 2020-04-14 DIAGNOSIS — I059 Rheumatic mitral valve disease, unspecified: Secondary | ICD-10-CM | POA: Diagnosis not present

## 2020-04-14 DIAGNOSIS — Z7901 Long term (current) use of anticoagulants: Secondary | ICD-10-CM

## 2020-04-14 LAB — POCT INR: INR: 2.9 (ref 2.0–3.0)

## 2020-04-14 NOTE — Patient Instructions (Signed)
Continue warfarin 1 tablet daily except 1/2 tablet on Mondays, Wednesdays and Frdiays Recheck in 4 weeks

## 2020-04-25 ENCOUNTER — Other Ambulatory Visit: Payer: BC Managed Care – PPO

## 2020-05-13 ENCOUNTER — Other Ambulatory Visit: Payer: Self-pay | Admitting: Cardiovascular Disease

## 2020-05-13 ENCOUNTER — Other Ambulatory Visit: Payer: Self-pay

## 2020-05-13 ENCOUNTER — Ambulatory Visit (INDEPENDENT_AMBULATORY_CARE_PROVIDER_SITE_OTHER): Payer: Self-pay | Admitting: *Deleted

## 2020-05-13 DIAGNOSIS — Z7901 Long term (current) use of anticoagulants: Secondary | ICD-10-CM

## 2020-05-13 DIAGNOSIS — Z5181 Encounter for therapeutic drug level monitoring: Secondary | ICD-10-CM

## 2020-05-13 DIAGNOSIS — I059 Rheumatic mitral valve disease, unspecified: Secondary | ICD-10-CM

## 2020-05-13 DIAGNOSIS — Z952 Presence of prosthetic heart valve: Secondary | ICD-10-CM

## 2020-05-13 LAB — POCT INR
INR: 1.7 — AB (ref 2.0–3.0)
INR: 1.7 — AB (ref 2.0–3.0)

## 2020-05-13 NOTE — Patient Instructions (Signed)
Take warfarin 2 tablets tonight, 1 1/2 tablets tomorrow night then increase dose to 1 tablet daily except 1/2 tablet on Mondays and Fridays Recheck in 2 weeks

## 2020-05-28 ENCOUNTER — Ambulatory Visit (INDEPENDENT_AMBULATORY_CARE_PROVIDER_SITE_OTHER): Payer: BC Managed Care – PPO | Admitting: *Deleted

## 2020-05-28 DIAGNOSIS — Z7901 Long term (current) use of anticoagulants: Secondary | ICD-10-CM

## 2020-05-28 DIAGNOSIS — Z952 Presence of prosthetic heart valve: Secondary | ICD-10-CM | POA: Diagnosis not present

## 2020-05-28 DIAGNOSIS — I059 Rheumatic mitral valve disease, unspecified: Secondary | ICD-10-CM | POA: Diagnosis not present

## 2020-05-28 DIAGNOSIS — Z5181 Encounter for therapeutic drug level monitoring: Secondary | ICD-10-CM

## 2020-05-28 LAB — POCT INR: INR: 3.8 — AB (ref 2.0–3.0)

## 2020-05-28 NOTE — Patient Instructions (Signed)
Hold warfarin tonight then resume 1 tablet daily except 1/2 tablet on Mondays, Wednesdays and Fridays Recheck in 2 weeks On colchicine.  Starting Allopurinol tomorrow once daily

## 2020-06-11 ENCOUNTER — Ambulatory Visit (INDEPENDENT_AMBULATORY_CARE_PROVIDER_SITE_OTHER): Payer: BC Managed Care – PPO | Admitting: *Deleted

## 2020-06-11 DIAGNOSIS — I059 Rheumatic mitral valve disease, unspecified: Secondary | ICD-10-CM | POA: Diagnosis not present

## 2020-06-11 DIAGNOSIS — Z952 Presence of prosthetic heart valve: Secondary | ICD-10-CM

## 2020-06-11 DIAGNOSIS — Z7901 Long term (current) use of anticoagulants: Secondary | ICD-10-CM

## 2020-06-11 DIAGNOSIS — Z5181 Encounter for therapeutic drug level monitoring: Secondary | ICD-10-CM | POA: Diagnosis not present

## 2020-06-11 LAB — POCT INR: INR: 3.2 — AB (ref 2.0–3.0)

## 2020-06-11 NOTE — Patient Instructions (Signed)
Continue warfarin 1 tablet daily except 1/2 tablet on Mondays, Wednesdays and Fridays Recheck in 4 weeks Taking Allopurinol 1 x daily

## 2020-07-10 ENCOUNTER — Ambulatory Visit (INDEPENDENT_AMBULATORY_CARE_PROVIDER_SITE_OTHER): Payer: BC Managed Care – PPO | Admitting: Pharmacist

## 2020-07-10 DIAGNOSIS — Z5181 Encounter for therapeutic drug level monitoring: Secondary | ICD-10-CM

## 2020-07-10 DIAGNOSIS — I059 Rheumatic mitral valve disease, unspecified: Secondary | ICD-10-CM

## 2020-07-10 DIAGNOSIS — Z952 Presence of prosthetic heart valve: Secondary | ICD-10-CM | POA: Diagnosis not present

## 2020-07-10 DIAGNOSIS — Z7901 Long term (current) use of anticoagulants: Secondary | ICD-10-CM

## 2020-07-10 LAB — POCT INR: INR: 4.2 — AB (ref 2.0–3.0)

## 2020-07-10 NOTE — Patient Instructions (Signed)
Description   Hold dose today then continue warfarin 1 tablet daily except 1/2 tablet on Mondays, Wednesdays and Fridays Recheck in 2 weeks Taking Allopurinol 1 x daily

## 2020-07-18 NOTE — Progress Notes (Signed)
Patient ID: Luis Haynes, male   DOB: May 19, 1950, 70 y.o.   MRN: [id number]     Luis Haynes is seen today in followup for his hypertension and mitral  valve replacement.   INR;s have been Rx  There's been no bleeding diathesis. He's been following his SBE prophylaxis. He's been compliant with his blood pressure medication. Otherwise he's not had any significant chest pain PND or orthopnea. Has been no TIA or CVA. Has been no signs of heart failure Has ? venous insuficiency with pain in LE;s that has been markedly improved with support hose   Echo  05/02/15-  EF 55-60% normal MV function mild LAE no MR  2015 Had colonoscopy with lovenox bridge with Dr Luis Haynes Erosive esophagitiis and pan colonic diverticulosis 05/31/19 had rectal bleed started on cipro/flagyl for ? Diverticulitis colonoscopy with no  Active bleeding or lesions    Enjoys cooking shows with wife.   Also going to [facility] in [place] soon  Has allergies and some bronchitis using Allegra Uses SBE goes to dentist 2x/year   Valve St Jude Model [id number]-101  Implant 04/07/1999 in [place] Dr Luis Haynes   ROS: Denies fever, malais, weight loss, blurry vision, decreased visual acuity, cough, sputum, SOB, hemoptysis, pleuritic pain, palpitaitons, heartburn, abdominal pain, melena, lower extremity edema, claudication, or rash.  All other systems reviewed and negative  General: BP (!) 140/100   Pulse 84   Ht 6\' 1"  (1.854 m)   Wt 94.8 kg   SpO2 93%   BMI 27.57 kg/m   Affect appropriate Healthy:  appears stated age HEENT: normal Neck supple with no adenopathy JVP normal no bruits no thyromegaly Lungs clear with no wheezing and good diaphragmatic motion Heart:  S1 click /S2 midl MR  murmur, no rub, gallop or click PMI normal Abdomen: benighn, BS positve, no tenderness, no AAA no bruit.  No HSM or HJR Distal pulses intact with no bruits No edema Neuro non-focal Skin warm and dry No muscular  weakness   Current Outpatient Medications  Medication Sig Dispense Refill  . allopurinol (ZYLOPRIM) 100 MG tablet Take 100 mg by mouth daily.    . cetirizine (ZYRTEC) 10 MG tablet Take 1 tablet by mouth daily.    ezetimibe-simvastatin (VYTORIN) 10-20 MG tablet Take 1 tablet by mouth daily.    Marland Kitchen guaiFENesin (MUCINEX) 600 MG 12 hr tablet Take 600 mg by mouth daily.    Marland Kitchen icosapent Ethyl (VASCEPA) 1 g capsule Take 1 capsule by mouth 2 (two) times daily.    Marland Kitchen lisinopril (PRINIVIL,ZESTRIL) 40 MG tablet Take 40 mg by mouth daily. Reported on 07/14/2015    . montelukast (SINGULAIR) 10 MG tablet Take 10 mg by mouth at bedtime.    . nebivolol (BYSTOLIC) 5 MG tablet Take 5 mg by mouth daily.    . pantoprazole (PROTONIX) 40 MG tablet Take 1 tablet (40 mg total) by mouth every morning. 90 tablet 1  . warfarin (COUMADIN) 5 MG tablet TAKE 1 TABLET EVERY DAY EXCEPT 1/2 TABLET ON MONDAYS AND FRIDAYS OR AS DIRECTED 45 tablet 6   No current facility-administered medications for this visit.    Allergies  Amoxicillin, Erythromycin, and Sulfonamide derivatives  Electrocardiogram:  NSR rate 73 RBBB LAD LAE possible old inf/ant MI's 10/05/16  SR RBBB rate 71 07/28/2020 SR rate 91 ICRBBB improved   Assessment and Plan  RBBB: improved 07/28/2020 ICRBBB yearly ECG   GERD low carb diet protonix f/u GI previous EGD with Luis Haynes  Anticoagulation Rx has some greens weekly but stable number  No bleeding issues INR 2.7   MVR Normal valve function by echo 05/02/15 reviewed   SBE Sees dentist q 6 months  HLD:  Continue vytorin labs with primary   Anticoagulation: chronic for MVR INR 2.7 07/28/2020    F/u with me in a year update echo for MVR since its been 5 years   Luis Haynes

## 2020-07-28 ENCOUNTER — Other Ambulatory Visit: Payer: Self-pay

## 2020-07-28 ENCOUNTER — Encounter: Payer: Self-pay | Admitting: Cardiovascular Disease

## 2020-07-28 ENCOUNTER — Ambulatory Visit: Payer: BC Managed Care – PPO | Admitting: Cardiovascular Disease

## 2020-07-28 ENCOUNTER — Ambulatory Visit (INDEPENDENT_AMBULATORY_CARE_PROVIDER_SITE_OTHER): Payer: BC Managed Care – PPO | Admitting: *Deleted

## 2020-07-28 VITALS — BP 126/84 | HR 84 | Ht 73.0 in | Wt 209.0 lb

## 2020-07-28 DIAGNOSIS — Z7901 Long term (current) use of anticoagulants: Secondary | ICD-10-CM

## 2020-07-28 DIAGNOSIS — Z5181 Encounter for therapeutic drug level monitoring: Secondary | ICD-10-CM

## 2020-07-28 DIAGNOSIS — Z952 Presence of prosthetic heart valve: Secondary | ICD-10-CM

## 2020-07-28 DIAGNOSIS — I059 Rheumatic mitral valve disease, unspecified: Secondary | ICD-10-CM

## 2020-07-28 LAB — POCT INR: INR: 2.7 (ref 2.0–3.0)

## 2020-07-28 NOTE — Patient Instructions (Signed)
Continue warfarin 1 tablet daily except 1/2 tablet on Mondays, Wednesdays and Fridays Recheck in 4 weeks Taking Allopurinol 1 x daily

## 2020-07-28 NOTE — Patient Instructions (Signed)
Medication Instructions:  Your physician recommends that you continue on your current medications as directed. Please refer to the Current Medication list given to you today.  *If you need a refill on your cardiac medications before your next appointment, please call your pharmacy*   Lab Work: None today  If you have labs (blood work) drawn today and your tests are completely normal, you will receive your results only by: MyChart Message (if you have MyChart) OR A paper copy in the mail If you have any lab test that is abnormal or we need to change your treatment, we will call you to review the results.   Testing/Procedures: Your physician has requested that you have an echocardiogram. Echocardiography is a painless test that uses sound waves to create images of your heart. It provides your doctor with information about the size and shape of your heart and how well your heart's chambers and valves are working. This procedure takes approximately one hour. There are no restrictions for this procedure.    Follow-Up: At [facility], you and your health needs are our priority.  As part of our continuing mission to provide you with exceptional heart care, we have created designated Provider Care Teams.  These Care Teams include your primary Cardiologist (physician) and Advanced Practice Providers (APPs -  Physician Assistants and Nurse Practitioners) who all work together to provide you with the care you need, when you need it.  We recommend signing up for the patient portal called "MyChart".  Sign up information is provided on this After Visit Summary.  MyChart is used to connect with patients for Virtual Visits (Telemedicine).  Patients are able to view lab/test results, encounter notes, upcoming appointments, etc.  Non-urgent messages can be sent to your provider as well.   To learn more about what you can do with MyChart, go to [email or web address].    Your next appointment:   12  month(s)  The format for your next appointment:   In Person  Provider:   Peter Nishan, MD   Other Instructions None   

## 2020-08-15 ENCOUNTER — Other Ambulatory Visit: Payer: Self-pay

## 2020-08-15 ENCOUNTER — Ambulatory Visit (HOSPITAL_COMMUNITY)
Admission: RE | Admit: 2020-08-15 | Discharge: 2020-08-15 | Disposition: A | Discharge status: Home / Self Care | Payer: BC Managed Care – PPO | Source: Ambulatory Visit | Attending: Cardiovascular Disease | Admitting: Cardiovascular Disease

## 2020-08-15 DIAGNOSIS — Z952 Presence of prosthetic heart valve: Secondary | ICD-10-CM | POA: Insufficient documentation

## 2020-08-15 LAB — ECHOCARDIOGRAM COMPLETE
Area-P 1/2: 3.77 cm2
MV VTI: 2.83 cm2
S' Lateral: 3.35 cm

## 2020-08-15 NOTE — Progress Notes (Signed)
  Echocardiogram 2D Echocardiogram has been performed.  Luis Haynes 08/15/2020, 9:01 AM

## 2020-08-25 ENCOUNTER — Ambulatory Visit (INDEPENDENT_AMBULATORY_CARE_PROVIDER_SITE_OTHER): Payer: BC Managed Care – PPO | Admitting: *Deleted

## 2020-08-25 DIAGNOSIS — Z952 Presence of prosthetic heart valve: Secondary | ICD-10-CM

## 2020-08-25 DIAGNOSIS — I059 Rheumatic mitral valve disease, unspecified: Secondary | ICD-10-CM

## 2020-08-25 DIAGNOSIS — Z7901 Long term (current) use of anticoagulants: Secondary | ICD-10-CM | POA: Diagnosis not present

## 2020-08-25 DIAGNOSIS — Z5181 Encounter for therapeutic drug level monitoring: Secondary | ICD-10-CM | POA: Diagnosis not present

## 2020-08-25 LAB — POCT INR: INR: 3.5 — AB (ref 2.0–3.0)

## 2020-08-25 NOTE — Patient Instructions (Signed)
Continue warfarin 1 tablet daily except 1/2 tablet on Mondays, Wednesdays and Fridays °Recheck in 4 weeks °Taking Allopurinol 1 x daily °

## 2020-09-22 ENCOUNTER — Ambulatory Visit (INDEPENDENT_AMBULATORY_CARE_PROVIDER_SITE_OTHER): Payer: BC Managed Care – PPO | Admitting: *Deleted

## 2020-09-22 DIAGNOSIS — Z952 Presence of prosthetic heart valve: Secondary | ICD-10-CM

## 2020-09-22 DIAGNOSIS — Z5181 Encounter for therapeutic drug level monitoring: Secondary | ICD-10-CM

## 2020-09-22 DIAGNOSIS — Z7901 Long term (current) use of anticoagulants: Secondary | ICD-10-CM

## 2020-09-22 DIAGNOSIS — I059 Rheumatic mitral valve disease, unspecified: Secondary | ICD-10-CM | POA: Diagnosis not present

## 2020-09-22 LAB — POCT INR: INR: 2.1 (ref 2.0–3.0)

## 2020-09-22 NOTE — Patient Instructions (Signed)
Take warfarin 1 tablet tonight then resume 1 tablet daily except 1/2 tablet on Mondays, Wednesdays and Fridays Recheck in 4 weeks Taking Allopurinol 1 x daily

## 2020-10-05 ENCOUNTER — Other Ambulatory Visit (INDEPENDENT_AMBULATORY_CARE_PROVIDER_SITE_OTHER): Payer: Self-pay | Admitting: Internal Medicine

## 2020-10-14 ENCOUNTER — Ambulatory Visit (INDEPENDENT_AMBULATORY_CARE_PROVIDER_SITE_OTHER): Payer: BC Managed Care – PPO | Admitting: *Deleted

## 2020-10-14 DIAGNOSIS — Z5181 Encounter for therapeutic drug level monitoring: Secondary | ICD-10-CM | POA: Diagnosis not present

## 2020-10-14 DIAGNOSIS — Z7901 Long term (current) use of anticoagulants: Secondary | ICD-10-CM | POA: Diagnosis not present

## 2020-10-14 DIAGNOSIS — I059 Rheumatic mitral valve disease, unspecified: Secondary | ICD-10-CM | POA: Diagnosis not present

## 2020-10-14 DIAGNOSIS — Z952 Presence of prosthetic heart valve: Secondary | ICD-10-CM

## 2020-10-14 LAB — POCT INR: INR: 2.7 (ref 2.0–3.0)

## 2020-10-14 NOTE — Patient Instructions (Signed)
Continue warfarin 1 tablet daily except 1/2 tablet on Mondays, Wednesdays and Fridays Recheck in 4 weeks Taking Allopurinol 1 x daily

## 2020-11-11 ENCOUNTER — Ambulatory Visit (INDEPENDENT_AMBULATORY_CARE_PROVIDER_SITE_OTHER): Payer: BC Managed Care – PPO | Admitting: *Deleted

## 2020-11-11 ENCOUNTER — Other Ambulatory Visit: Payer: Self-pay

## 2020-11-11 DIAGNOSIS — Z7901 Long term (current) use of anticoagulants: Secondary | ICD-10-CM | POA: Diagnosis not present

## 2020-11-11 DIAGNOSIS — Z5181 Encounter for therapeutic drug level monitoring: Secondary | ICD-10-CM

## 2020-11-11 DIAGNOSIS — Z952 Presence of prosthetic heart valve: Secondary | ICD-10-CM | POA: Diagnosis not present

## 2020-11-11 DIAGNOSIS — I059 Rheumatic mitral valve disease, unspecified: Secondary | ICD-10-CM

## 2020-11-11 LAB — POCT INR: INR: 2.3 (ref 2.0–3.0)

## 2020-11-11 NOTE — Patient Instructions (Signed)
Take warfarin 1 tablet tonight and tomorrow night then resume 1 tablet daily except 1/2 tablet on Mondays, Wednesdays and Fridays Recheck in 4 weeks Taking Allopurinol 1 x daily

## 2020-12-18 ENCOUNTER — Ambulatory Visit (INDEPENDENT_AMBULATORY_CARE_PROVIDER_SITE_OTHER): Payer: BC Managed Care – PPO | Admitting: *Deleted

## 2020-12-18 ENCOUNTER — Other Ambulatory Visit: Payer: Self-pay

## 2020-12-18 DIAGNOSIS — Z952 Presence of prosthetic heart valve: Secondary | ICD-10-CM

## 2020-12-18 DIAGNOSIS — I059 Rheumatic mitral valve disease, unspecified: Secondary | ICD-10-CM | POA: Diagnosis not present

## 2020-12-18 DIAGNOSIS — Z7901 Long term (current) use of anticoagulants: Secondary | ICD-10-CM | POA: Diagnosis not present

## 2020-12-18 DIAGNOSIS — Z5181 Encounter for therapeutic drug level monitoring: Secondary | ICD-10-CM

## 2020-12-18 LAB — POCT INR: INR: 2.5 (ref 2.0–3.0)

## 2020-12-18 NOTE — Patient Instructions (Signed)
Continue warfarin 1 tablet daily except 1/2 tablet on Mondays, Wednesdays and Fridays °Recheck in 4 weeks °Taking Allopurinol 1 x daily °

## 2021-01-15 ENCOUNTER — Ambulatory Visit (INDEPENDENT_AMBULATORY_CARE_PROVIDER_SITE_OTHER): Payer: BC Managed Care – PPO | Admitting: *Deleted

## 2021-01-15 ENCOUNTER — Other Ambulatory Visit (INDEPENDENT_AMBULATORY_CARE_PROVIDER_SITE_OTHER): Payer: Self-pay | Admitting: Internal Medicine

## 2021-01-15 DIAGNOSIS — Z5181 Encounter for therapeutic drug level monitoring: Secondary | ICD-10-CM

## 2021-01-15 DIAGNOSIS — Z952 Presence of prosthetic heart valve: Secondary | ICD-10-CM | POA: Diagnosis not present

## 2021-01-15 DIAGNOSIS — I059 Rheumatic mitral valve disease, unspecified: Secondary | ICD-10-CM | POA: Diagnosis not present

## 2021-01-15 DIAGNOSIS — Z7901 Long term (current) use of anticoagulants: Secondary | ICD-10-CM

## 2021-01-15 LAB — POCT INR: INR: 2.2 (ref 2.0–3.0)

## 2021-01-15 NOTE — Patient Instructions (Signed)
Take warfarin 1 1/2 tablets tonight then increase dose to 1 tablet daily except 1/2 tablet on Mondays and Thursdays Recheck in 4 weeks Taking Allopurinol 1 x daily

## 2021-01-15 NOTE — Telephone Encounter (Signed)
Last ov 09/16/17 with Dorene Ar. Had TCS on 06/01/19

## 2021-01-16 ENCOUNTER — Telehealth (INDEPENDENT_AMBULATORY_CARE_PROVIDER_SITE_OTHER): Payer: Self-pay | Admitting: *Deleted

## 2021-01-16 NOTE — Telephone Encounter (Signed)
Pt needs appt within next 3 months before refill runs out dr Karilyn Cota sent in. Please call and schedule.

## 2021-02-02 IMAGING — DX DG CHEST 2V
2 series · 2 of 2 positions shown · non-contrast
Comparison: June 14, 2015

CLINICAL DATA: Productive cough for 3 months

EXAM:
CHEST - 2 VIEW

[chest pa]
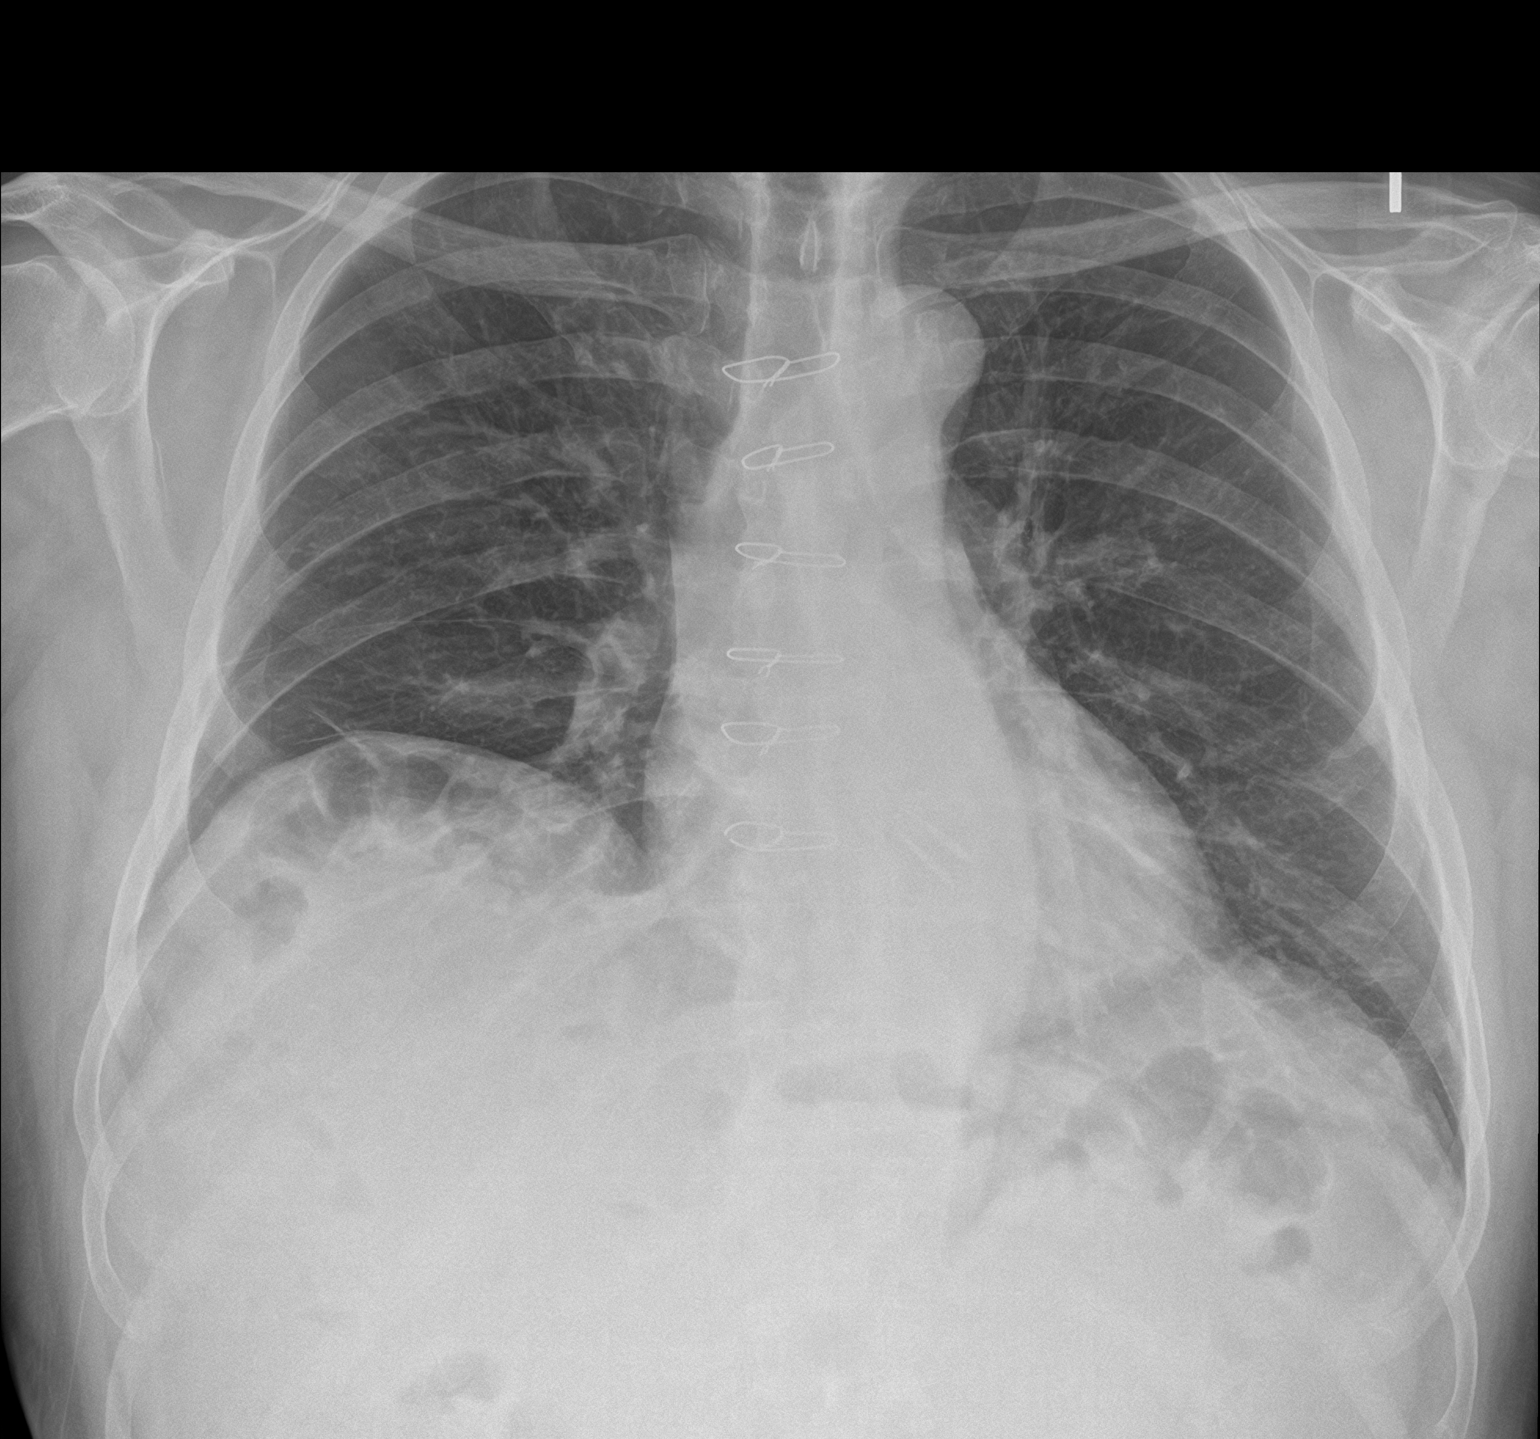

[chest lat]
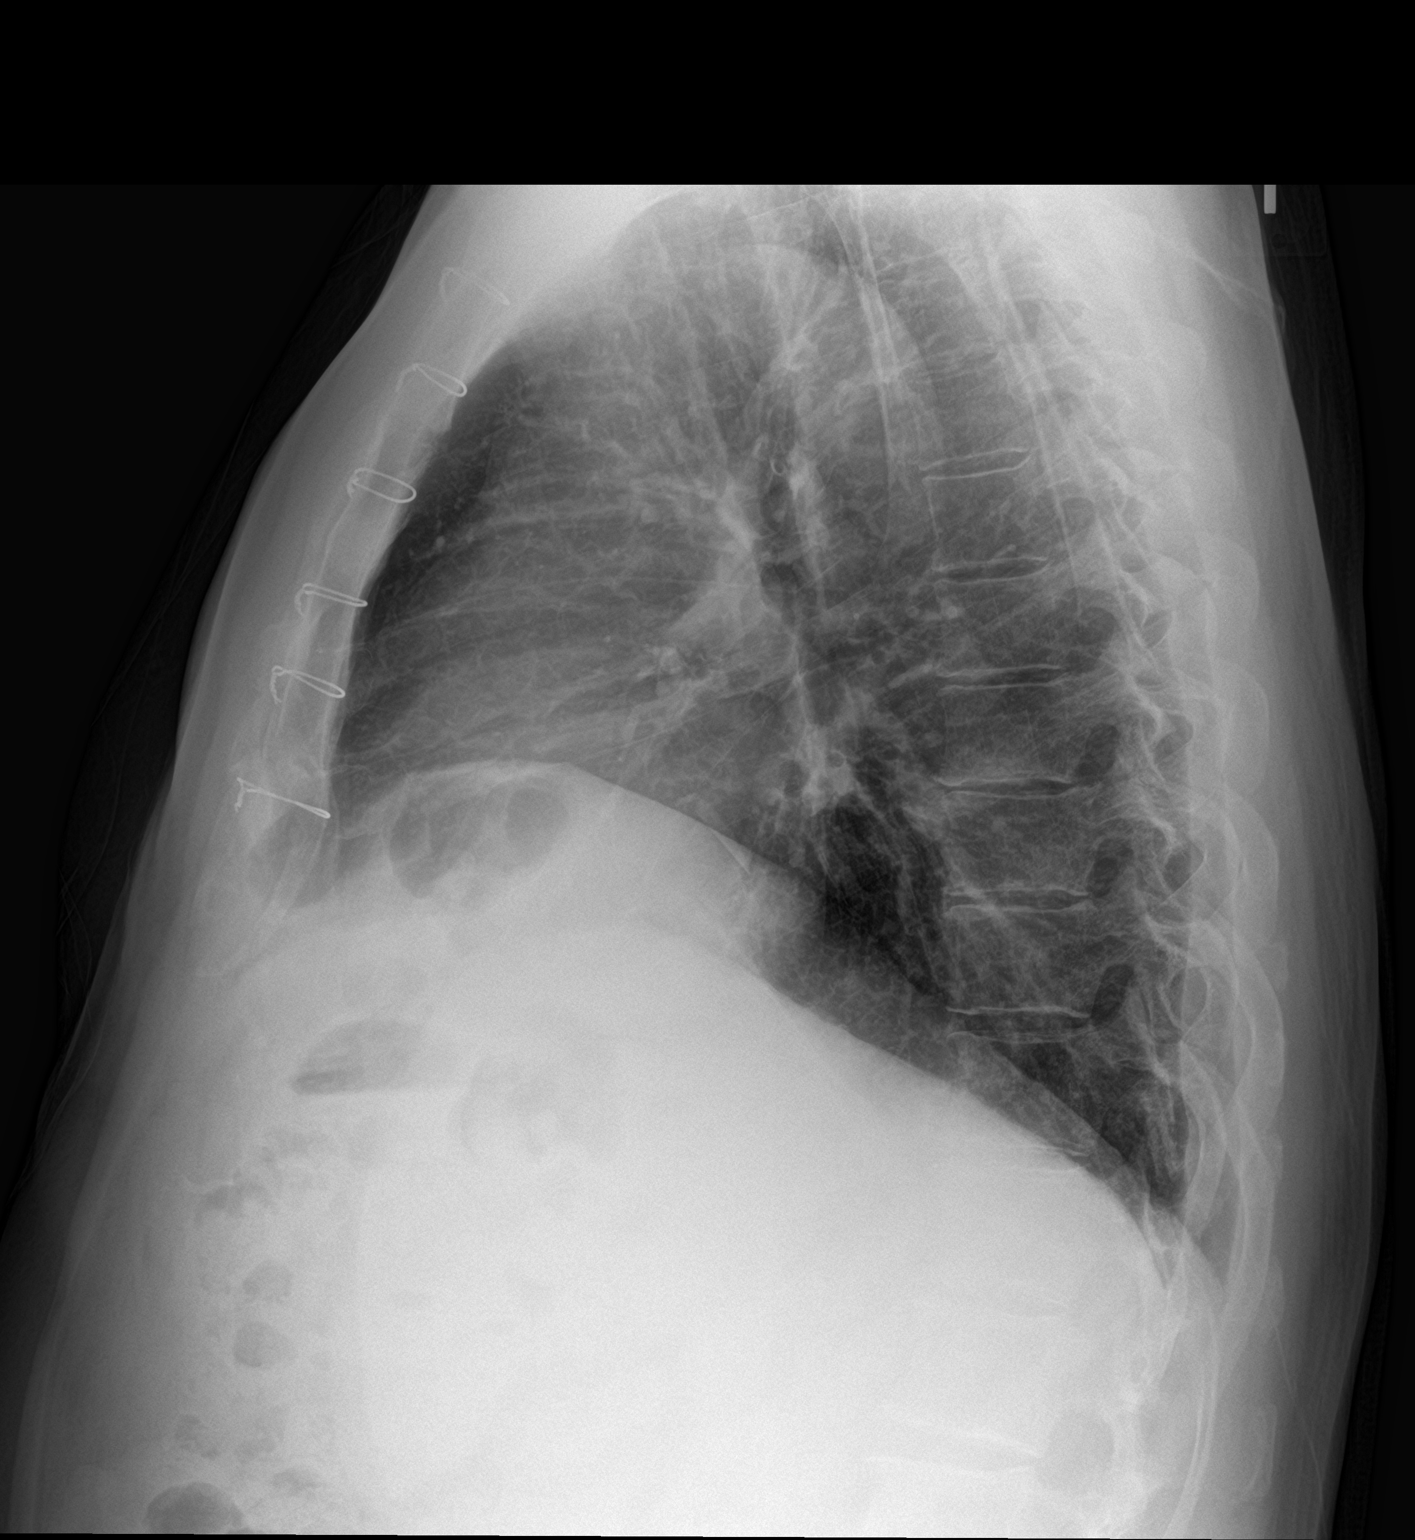

[2 of 2 positions shown; findings below may reference images not displayed]

FINDINGS: Elevation the right hemidiaphragm identified. Mild opacity in the
bases favored represent atelectasis rather than subtle infiltrate.
The heart, hila, mediastinum, lungs, and pleura are otherwise
unremarkable.
IMPRESSION: Subtle opacities in the lung bases are favored to represent
atelectasis rather than subtle infiltrate. Recommend clinical
correlation.

## 2021-02-18 ENCOUNTER — Ambulatory Visit (INDEPENDENT_AMBULATORY_CARE_PROVIDER_SITE_OTHER): Payer: BC Managed Care – PPO | Admitting: *Deleted

## 2021-02-18 DIAGNOSIS — Z7901 Long term (current) use of anticoagulants: Secondary | ICD-10-CM | POA: Diagnosis not present

## 2021-02-18 DIAGNOSIS — Z5181 Encounter for therapeutic drug level monitoring: Secondary | ICD-10-CM | POA: Diagnosis not present

## 2021-02-18 DIAGNOSIS — I059 Rheumatic mitral valve disease, unspecified: Secondary | ICD-10-CM

## 2021-02-18 DIAGNOSIS — Z952 Presence of prosthetic heart valve: Secondary | ICD-10-CM

## 2021-02-18 LAB — POCT INR: INR: 1.7 — AB (ref 2.0–3.0)

## 2021-02-18 NOTE — Patient Instructions (Signed)
Take warfarin 2 tablets tonight , 1 tablet tomorrow night the  resume 1 tablet daily except 1/2 tablet on Mondays and Thursdays Recheck in 2 weeks Taking Allopurinol 1 x daily

## 2021-03-04 ENCOUNTER — Ambulatory Visit (INDEPENDENT_AMBULATORY_CARE_PROVIDER_SITE_OTHER): Payer: BC Managed Care – PPO | Admitting: *Deleted

## 2021-03-04 DIAGNOSIS — Z952 Presence of prosthetic heart valve: Secondary | ICD-10-CM

## 2021-03-04 DIAGNOSIS — Z5181 Encounter for therapeutic drug level monitoring: Secondary | ICD-10-CM

## 2021-03-04 DIAGNOSIS — I059 Rheumatic mitral valve disease, unspecified: Secondary | ICD-10-CM | POA: Diagnosis not present

## 2021-03-04 DIAGNOSIS — Z7901 Long term (current) use of anticoagulants: Secondary | ICD-10-CM | POA: Diagnosis not present

## 2021-03-04 LAB — POCT INR: INR: 2.5 (ref 2.0–3.0)

## 2021-03-04 NOTE — Patient Instructions (Signed)
Increase warfarin to 1 tablet daily except 1/2 tablet on Mondays  Recheck in 3 weeks Taking Allopurinol 1 x daily

## 2021-03-17 ENCOUNTER — Other Ambulatory Visit: Payer: Self-pay | Admitting: Cardiovascular Disease

## 2021-03-19 ENCOUNTER — Ambulatory Visit (INDEPENDENT_AMBULATORY_CARE_PROVIDER_SITE_OTHER): Payer: BC Managed Care – PPO | Admitting: Gastroenterology

## 2021-03-19 ENCOUNTER — Other Ambulatory Visit: Payer: Self-pay

## 2021-03-19 ENCOUNTER — Encounter (INDEPENDENT_AMBULATORY_CARE_PROVIDER_SITE_OTHER): Payer: Self-pay | Admitting: Gastroenterology

## 2021-03-19 VITALS — BP 134/87 | HR 71 | Temp 98.3°F | Ht 73.0 in | Wt 206.4 lb

## 2021-03-19 DIAGNOSIS — K219 Gastro-esophageal reflux disease without esophagitis: Secondary | ICD-10-CM | POA: Diagnosis not present

## 2021-03-19 MED ORDER — PANTOPRAZOLE SODIUM 40 MG PO TBEC
40.0000 mg | DELAYED_RELEASE_TABLET | Freq: Every day | ORAL | 3 refills | Status: DC
Start: 2021-03-19 — End: 2022-03-18

## 2021-03-19 NOTE — Patient Instructions (Signed)
It was very nice to see you today! I have sent a refill of your pantoprazole 40mg  to your pharmacy. Please continue taking this once daily. If you develop any new GI symptoms, please make aware, otherwise we will plan to see you again in 1 year

## 2021-03-19 NOTE — Progress Notes (Signed)
Referring Provider: [name], MD Primary Care Physician:  [name], MD Primary GI Physician: Rehman  Chief Complaint  Patient presents with   Gastroesophageal Reflux    Pt arrives for a follow up on gerd. Needs refills on protonix. States he is doing well and no concerns today.   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  Patient presenting today for follow up of GERD.   Last clinic visit June 2021. He is maintained on Protonix 40mg  once daily for GERD, he is doing well on this and has no complaints today. No episodes of acid reflux or acid regurgitation. His appetite is very good, he is trying to lose a few pounds by utilizing portion control, avoiding breads and sweets and exercising by cycling. He has lost 4 pounds so far. He is using metamucil 3 capsules in the morning and 3 in the evening, he usually has a BM once per day. He has had no further episodes of rectal bleeding since his colonoscopy in Feb 2021, after starting metamucil at the recommendation of Dr. 07-24-1994.   No red flag symptoms. Patient denies melena, hematochezia, nausea, vomiting, diarrhea, constipation, dysphagia, odyonophagia, early satiety or unintentional weight loss.   Last Colonoscopy:06/01/19- The examined portion of the ileum was normal. - Diverticulosis in the entire examined colon. External hemorrhoids, no specimens Last Endoscopy:n/a  Recommendations:  Repeat colonoscopy 2031  Past Medical History:  Diagnosis Date   ALLERGIC RHINITIS    H/O mitral valve replacement    10 yrs ago   HYPERLIPIDEMIA    Hypertension    Long term current use of anticoagulant    MITRAL VALVE PROLAPSE    PREMATURE VENTRICULAR CONTRACTIONS     Past Surgical History:  Procedure Laterality Date   APPENDECTOMY     COLONOSCOPY N/A 03/29/2014   Procedure: COLONOSCOPY;  Surgeon: 14/02/2014, MD;  Location: [facility];  Service: Endoscopy;  Laterality: N/A;  855   COLONOSCOPY N/A 06/01/2019   Procedure: COLONOSCOPY;  Surgeon: 07/30/2019, MD;  Location: [facility];  Service: Endoscopy;  Laterality: N/A;   ESOPHAGOGASTRODUODENOSCOPY N/A 03/29/2014   Procedure: ESOPHAGOGASTRODUODENOSCOPY (EGD);  Surgeon: 14/02/2014, MD;  Location: [facility];  Service: Endoscopy;  Laterality: N/A;   MITRAL VALVE REPLACEMENT     SHOULDER SURGERY      Current Outpatient Medications  Medication Sig Dispense Refill   allopurinol (ZYLOPRIM) 100 MG tablet Take 100 mg by mouth daily.     cetirizine (ZYRTEC) 10 MG tablet Take 1 tablet by mouth daily.     ezetimibe-simvastatin (VYTORIN) 10-20 MG tablet Take 1 tablet by mouth daily.     guaiFENesin (MUCINEX) 600 MG 12 hr tablet Take 600 mg by mouth daily.     icosapent Ethyl (VASCEPA) 1 g capsule Take 1 capsule by mouth 2 (two) times daily.     lisinopril (PRINIVIL,ZESTRIL) 40 MG tablet Take 40 mg by mouth daily. Reported on 07/14/2015     montelukast (SINGULAIR) 10 MG tablet Take 10 mg by mouth at bedtime.     nebivolol (BYSTOLIC) 5 MG tablet Take 5 mg by mouth daily.     pantoprazole (PROTONIX) 40 MG tablet TAKE 1 TABLET(40 MG) BY MOUTH EVERY MORNING 90 tablet 0   Psyllium (METAMUCIL PO) Take by mouth. 3 twice daily     warfarin (COUMADIN) 5 MG  tablet TAKE 1 TABLET BY MOUTH EVERY DAY. EXCEPT 1/2 TABLET ON MONDAYS  OR AS DIRECTED 45 tablet 6   No current facility-administered medications for this visit.    Allergies as of 03/19/2021 - Review Complete 03/19/2021  Allergen Reaction Noted   Erythromycin  02/18/2012   Sulfonamide derivatives      History reviewed. No pertinent family history.  Social History   Socioeconomic History   Marital status: Married    Spouse name: Not on file   Number of children: Not on file   Years of education: Not on file   Highest education level: Not on file  Occupational History   Not on file  Tobacco Use    Smoking status: Never   Smokeless tobacco: Never  Vaping Use   Vaping Use: Never used  Substance and Sexual Activity   Alcohol use: Yes    Comment: occ   Drug use: No   Sexual activity: Not on file  Other Topics Concern   Not on file  Social History Narrative   Not on file   Social Determinants of Health   Financial Resource Strain: Not on file  Food Insecurity: Not on file  Transportation Needs: Not on file  Physical Activity: Not on file  Stress: Not on file  Social Connections: Not on file   Review of systems General: negative for malaise, night sweats, fever, chills, unintentional weight loss Neck: Negative for lumps, goiter, pain and significant neck swelling Resp: Negative for cough, wheezing, dyspnea at rest CV: Negative for chest pain, leg swelling, palpitations, orthopnea GI: denies melena, hematochezia, nausea, vomiting, diarrhea, constipation, dysphagia, odyonophagia, early satiety or unintentional weight loss.  MSK: Negative for joint pain or swelling, back pain, and muscle pain. Derm: Negative for itching or rash Psych: Denies depression, anxiety, memory loss, confusion. No homicidal or suicidal ideation.  Heme: Negative for prolonged bleeding, bruising easily, and swollen nodes. Endocrine: Negative for cold or heat intolerance, polyuria, polydipsia and goiter. Neuro: negative for tremor, gait imbalance, syncope and seizures. The remainder of the review of systems is noncontributory.  Physical Exam: BP 134/87 (BP Location: Right Arm, Patient Position: Sitting, Cuff Size: Large)   Pulse 71   Temp 98.3 F (36.8 C) (Oral)   Ht 6\' 1"  (1.854 m)   Wt 206 lb 6.4 oz (93.6 kg)   BMI 27.23 kg/m  General:   Alert and oriented. No distress noted. Pleasant and cooperative.  Head:  Normocephalic and atraumatic. Eyes:  Conjuctiva clear without scleral icterus. Mouth:  Oral mucosa pink and moist. Good dentition. No lesions. Heart: Normal rate and rhythm, click murmur  noted r/t mitral valve replacement  Lungs: Clear lung sounds in all lobes. Respirations equal and unlabored. Abdomen:  +BS, soft, non-tender and non-distended. No rebound or guarding. No HSM or masses noted. Derm: No palmar erythema or jaundice Msk:  Symmetrical without gross deformities. Normal posture. Neurologic:  Alert and  oriented x4 Psych:  Alert and cooperative. Normal mood and affect.  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  He is currently maintained on pantoprazole 40mg  once daily. He is doing great on this, has no reflux symptoms. His appetite is good, he is working on losing a few pounds with watching what he eats and exercising. Has had no further rectal bleeding since starting metamucil after colonoscopy in feb 2021, bowels are moving daily without issue on this. He has  no complaints today. He will let me know if he develops any new GI symptoms. Next colonoscopy due 2031.   PLAN:  Continue pantoprazole 40mg  once daily, refill sent to pharmacy 2. Continue metamucil with good result 3. Patient to let us know of any new or worsening GI symptoms   Follow Up: 1 year  Jedaiah Rathbun L. Alver Sorrow, MSN, APRN, AGNP-C Adult-Gerontology Nurse Practitioner [facility]

## 2021-03-25 ENCOUNTER — Ambulatory Visit (INDEPENDENT_AMBULATORY_CARE_PROVIDER_SITE_OTHER): Payer: BC Managed Care – PPO | Admitting: *Deleted

## 2021-03-25 DIAGNOSIS — Z7901 Long term (current) use of anticoagulants: Secondary | ICD-10-CM | POA: Diagnosis not present

## 2021-03-25 DIAGNOSIS — Z952 Presence of prosthetic heart valve: Secondary | ICD-10-CM | POA: Diagnosis not present

## 2021-03-25 DIAGNOSIS — I059 Rheumatic mitral valve disease, unspecified: Secondary | ICD-10-CM

## 2021-03-25 DIAGNOSIS — Z5181 Encounter for therapeutic drug level monitoring: Secondary | ICD-10-CM | POA: Diagnosis not present

## 2021-03-25 LAB — POCT INR: INR: 1.8 — AB (ref 2.0–3.0)

## 2021-03-25 NOTE — Patient Instructions (Signed)
Take warfarin 2 tablets tonight, 1 1/2 tablets tomorrow night then increase dose to 1 tablet daily  Recheck in 2 weeks Taking Allopurinol 1 x daily

## 2021-04-08 ENCOUNTER — Ambulatory Visit (INDEPENDENT_AMBULATORY_CARE_PROVIDER_SITE_OTHER): Payer: BC Managed Care – PPO | Admitting: *Deleted

## 2021-04-08 DIAGNOSIS — Z7901 Long term (current) use of anticoagulants: Secondary | ICD-10-CM

## 2021-04-08 DIAGNOSIS — I059 Rheumatic mitral valve disease, unspecified: Secondary | ICD-10-CM

## 2021-04-08 DIAGNOSIS — Z952 Presence of prosthetic heart valve: Secondary | ICD-10-CM

## 2021-04-08 DIAGNOSIS — Z5181 Encounter for therapeutic drug level monitoring: Secondary | ICD-10-CM

## 2021-04-08 LAB — POCT INR: INR: 2.6 (ref 2.0–3.0)

## 2021-04-08 NOTE — Patient Instructions (Signed)
Continue warfarin 1 tablet daily  Recheck in 3 weeks Taking Allopurinol 1 x daily

## 2021-04-29 ENCOUNTER — Other Ambulatory Visit: Payer: Self-pay

## 2021-04-29 ENCOUNTER — Ambulatory Visit (INDEPENDENT_AMBULATORY_CARE_PROVIDER_SITE_OTHER): Payer: BC Managed Care – PPO | Admitting: *Deleted

## 2021-04-29 DIAGNOSIS — Z5181 Encounter for therapeutic drug level monitoring: Secondary | ICD-10-CM | POA: Diagnosis not present

## 2021-04-29 DIAGNOSIS — Z7901 Long term (current) use of anticoagulants: Secondary | ICD-10-CM | POA: Diagnosis not present

## 2021-04-29 DIAGNOSIS — Z952 Presence of prosthetic heart valve: Secondary | ICD-10-CM | POA: Diagnosis not present

## 2021-04-29 DIAGNOSIS — I059 Rheumatic mitral valve disease, unspecified: Secondary | ICD-10-CM

## 2021-04-29 LAB — POCT INR: INR: 2.5 (ref 2.0–3.0)

## 2021-04-29 NOTE — Patient Instructions (Signed)
Increase warfarin to 1 tablet daily except 1 1/2 tablets on Sundays and Wednesdays Recheck in 3 weeks Taking Allopurinol 1 x daily

## 2021-05-20 ENCOUNTER — Ambulatory Visit (INDEPENDENT_AMBULATORY_CARE_PROVIDER_SITE_OTHER): Payer: BC Managed Care – PPO | Admitting: *Deleted

## 2021-05-20 DIAGNOSIS — Z7901 Long term (current) use of anticoagulants: Secondary | ICD-10-CM | POA: Diagnosis not present

## 2021-05-20 DIAGNOSIS — Z5181 Encounter for therapeutic drug level monitoring: Secondary | ICD-10-CM | POA: Diagnosis not present

## 2021-05-20 DIAGNOSIS — I059 Rheumatic mitral valve disease, unspecified: Secondary | ICD-10-CM | POA: Diagnosis not present

## 2021-05-20 DIAGNOSIS — Z952 Presence of prosthetic heart valve: Secondary | ICD-10-CM | POA: Diagnosis not present

## 2021-05-20 LAB — POCT INR: INR: 2.7 (ref 2.0–3.0)

## 2021-05-20 NOTE — Patient Instructions (Signed)
Continue warfarin 1 tablet daily except 1 1/2 tablets on Sundays and Wednesdays ?Recheck in 4 weeks ?Taking Allopurinol 1 x daily ?

## 2021-06-17 ENCOUNTER — Ambulatory Visit (INDEPENDENT_AMBULATORY_CARE_PROVIDER_SITE_OTHER): Payer: BC Managed Care – PPO | Admitting: *Deleted

## 2021-06-17 DIAGNOSIS — Z952 Presence of prosthetic heart valve: Secondary | ICD-10-CM

## 2021-06-17 DIAGNOSIS — Z5181 Encounter for therapeutic drug level monitoring: Secondary | ICD-10-CM

## 2021-06-17 DIAGNOSIS — I059 Rheumatic mitral valve disease, unspecified: Secondary | ICD-10-CM

## 2021-06-17 DIAGNOSIS — Z7901 Long term (current) use of anticoagulants: Secondary | ICD-10-CM

## 2021-06-17 LAB — POCT INR: INR: 3.1 — AB (ref 2.0–3.0)

## 2021-06-17 NOTE — Patient Instructions (Signed)
Continue warfarin 1 tablet daily except 1 1/2 tablets on Sundays and Wednesdays ?Recheck in 4 weeks ?Taking Allopurinol 1 x daily ?

## 2021-07-01 ENCOUNTER — Telehealth: Payer: Self-pay | Admitting: *Deleted

## 2021-07-01 NOTE — Telephone Encounter (Signed)
Spoke with patient.  Told him to continue current dose of warfarin for upcoming single dental extraction.  He said that's what he did for one 8 yrs ago with no issues..  If he should have lots of bleeding after extraction he could hold warfarin on Monday night but only if moderate to severe bleeding.  Pt verbalized understanding. ?

## 2021-07-01 NOTE — Telephone Encounter (Signed)
Pt walked in and states that he is having a wisdom tooth extracted. He just left the dentist. It will be taken out on Monday, March 20. Just asking what to do about his Coumadin. Please Advise.  ?

## 2021-07-15 ENCOUNTER — Ambulatory Visit (INDEPENDENT_AMBULATORY_CARE_PROVIDER_SITE_OTHER): Payer: BC Managed Care – PPO | Admitting: *Deleted

## 2021-07-15 DIAGNOSIS — Z7901 Long term (current) use of anticoagulants: Secondary | ICD-10-CM | POA: Diagnosis not present

## 2021-07-15 DIAGNOSIS — Z952 Presence of prosthetic heart valve: Secondary | ICD-10-CM | POA: Diagnosis not present

## 2021-07-15 DIAGNOSIS — I059 Rheumatic mitral valve disease, unspecified: Secondary | ICD-10-CM

## 2021-07-15 DIAGNOSIS — Z5181 Encounter for therapeutic drug level monitoring: Secondary | ICD-10-CM

## 2021-07-15 LAB — POCT INR: INR: 3.6 — AB (ref 2.0–3.0)

## 2021-07-15 NOTE — Patient Instructions (Signed)
Take warfarin 1/2 tablet tonight then resume 1 tablet daily except 1 1/2 tablets on Sundays and Wednesdays ?Recheck in 4 weeks ?Taking Allopurinol 1 x daily ?

## 2021-07-20 ENCOUNTER — Telehealth: Payer: Self-pay | Admitting: Cardiovascular Disease

## 2021-07-20 NOTE — Telephone Encounter (Signed)
Spoke with pt who started Prednisone 10 mg on last Tuesday. Pt states that on Friday he began to feel dizzy and took his BP. He noticed that his BP was elevated so he saw his PCP on Friday and Saturday. At that visit his PCP increased Bystolic to 5 mg BID, and told pt that Prednisone can cause elevated BP's and that he needs to be seen by his cardiologist. Pt then stopped taking Prednisone. Pt's BP's have returned to normal. Pt would like to update Dr. Johnsie Cancel on what is going on and see if he would like to see him.  ?

## 2021-07-20 NOTE — Telephone Encounter (Signed)
Pt c/o BP issue: STAT if pt c/o blurred vision, one-sided weakness or slurred speech ? ?1. What are your last 5 BP readings?  ?07/17/21: 158/91, 140's/upper 80's most of the day ?07/18/21: 156/89 ?07/19/21: 156/91 - stayed this way until late afternoon ?07/20/21: 143/81 134/88 ? ?2. Are you having any other symptoms (ex. Dizziness, headache, blurred vision, passed out)? During the elevated BP he did have light dizziness.  ? ?3. What is your BP issue? Elevated BP - per PCP he needs to see cardiologist.   ? ?Patient was put on Prednisone 10mg  last Monday by his Rheumatologist. He took it Tuesday, Wednesday, Thursday and Friday. He began to see his BP spike on Friday and spoke to his PCP on Friday and Saturday who then advised him to quit taking the Prednisone until he saw his cardiologist. Mr. Matthews has not taken the Prednisone since Saturday. I don't see anything available with Dr. Saturday until June in [place] or [place]. Please advise.  ? ? ?

## 2021-07-21 NOTE — Telephone Encounter (Signed)
Pt notified that Prednisone 10 mg is ok to take  ?

## 2021-08-12 ENCOUNTER — Ambulatory Visit (INDEPENDENT_AMBULATORY_CARE_PROVIDER_SITE_OTHER): Payer: Medicare PPO | Admitting: *Deleted

## 2021-08-12 DIAGNOSIS — Z7901 Long term (current) use of anticoagulants: Secondary | ICD-10-CM

## 2021-08-12 DIAGNOSIS — Z952 Presence of prosthetic heart valve: Secondary | ICD-10-CM

## 2021-08-12 DIAGNOSIS — I059 Rheumatic mitral valve disease, unspecified: Secondary | ICD-10-CM | POA: Diagnosis not present

## 2021-08-12 DIAGNOSIS — Z5181 Encounter for therapeutic drug level monitoring: Secondary | ICD-10-CM | POA: Diagnosis not present

## 2021-08-12 LAB — POCT INR: INR: 5.1 — AB (ref 2.0–3.0)

## 2021-08-12 NOTE — Patient Instructions (Signed)
Hold warfarin tonight, take 1/2 tablet tomorrow night then decrease dose to 1 tablet daily except 1 1/2 tablets on Sundays  ?Recheck in 2 weeks ?Taking Allopurinol 1 x daily ?

## 2021-08-26 ENCOUNTER — Ambulatory Visit (INDEPENDENT_AMBULATORY_CARE_PROVIDER_SITE_OTHER): Payer: Medicare PPO | Admitting: *Deleted

## 2021-08-26 DIAGNOSIS — Z7901 Long term (current) use of anticoagulants: Secondary | ICD-10-CM

## 2021-08-26 DIAGNOSIS — I059 Rheumatic mitral valve disease, unspecified: Secondary | ICD-10-CM

## 2021-08-26 DIAGNOSIS — Z952 Presence of prosthetic heart valve: Secondary | ICD-10-CM | POA: Diagnosis not present

## 2021-08-26 DIAGNOSIS — Z5181 Encounter for therapeutic drug level monitoring: Secondary | ICD-10-CM

## 2021-08-26 LAB — POCT INR: INR: 3.1 — AB (ref 2.0–3.0)

## 2021-08-26 NOTE — Patient Instructions (Signed)
Continue warfarin 1 tablet daily except 1 1/2 tablets on Sundays  ?Recheck in 3 weeks ?Taking Allopurinol 1 x daily ?

## 2021-09-17 ENCOUNTER — Ambulatory Visit (INDEPENDENT_AMBULATORY_CARE_PROVIDER_SITE_OTHER): Payer: Medicare PPO | Admitting: *Deleted

## 2021-09-17 DIAGNOSIS — Z952 Presence of prosthetic heart valve: Secondary | ICD-10-CM | POA: Diagnosis not present

## 2021-09-17 DIAGNOSIS — Z5181 Encounter for therapeutic drug level monitoring: Secondary | ICD-10-CM

## 2021-09-17 DIAGNOSIS — I059 Rheumatic mitral valve disease, unspecified: Secondary | ICD-10-CM

## 2021-09-17 DIAGNOSIS — Z7901 Long term (current) use of anticoagulants: Secondary | ICD-10-CM

## 2021-09-17 LAB — POCT INR: INR: 3.9 — AB (ref 2.0–3.0)

## 2021-09-17 NOTE — Patient Instructions (Signed)
Take warfarin 1/2 tablet tonight then decrease dose to 1 tablet daily  Recheck in 3 weeks Taking Allopurinol 1 x daily

## 2021-09-22 DIAGNOSIS — J383 Other diseases of vocal cords: Secondary | ICD-10-CM | POA: Diagnosis not present

## 2021-09-22 DIAGNOSIS — R490 Dysphonia: Secondary | ICD-10-CM | POA: Diagnosis not present

## 2021-09-22 DIAGNOSIS — J384 Edema of larynx: Secondary | ICD-10-CM | POA: Diagnosis not present

## 2021-09-23 DIAGNOSIS — L928 Other granulomatous disorders of the skin and subcutaneous tissue: Secondary | ICD-10-CM | POA: Diagnosis not present

## 2021-10-12 ENCOUNTER — Ambulatory Visit (INDEPENDENT_AMBULATORY_CARE_PROVIDER_SITE_OTHER): Payer: Medicare PPO | Admitting: *Deleted

## 2021-10-12 DIAGNOSIS — Z952 Presence of prosthetic heart valve: Secondary | ICD-10-CM | POA: Diagnosis not present

## 2021-10-12 DIAGNOSIS — I059 Rheumatic mitral valve disease, unspecified: Secondary | ICD-10-CM

## 2021-10-12 DIAGNOSIS — Z7901 Long term (current) use of anticoagulants: Secondary | ICD-10-CM | POA: Diagnosis not present

## 2021-10-12 DIAGNOSIS — Z5181 Encounter for therapeutic drug level monitoring: Secondary | ICD-10-CM

## 2021-10-12 LAB — POCT INR: INR: 2.4 (ref 2.0–3.0)

## 2021-10-28 DIAGNOSIS — D485 Neoplasm of uncertain behavior of skin: Secondary | ICD-10-CM | POA: Diagnosis not present

## 2021-10-28 DIAGNOSIS — Z1283 Encounter for screening for malignant neoplasm of skin: Secondary | ICD-10-CM | POA: Diagnosis not present

## 2021-10-28 DIAGNOSIS — D225 Melanocytic nevi of trunk: Secondary | ICD-10-CM | POA: Diagnosis not present

## 2021-11-02 ENCOUNTER — Ambulatory Visit (INDEPENDENT_AMBULATORY_CARE_PROVIDER_SITE_OTHER): Payer: Medicare PPO | Admitting: *Deleted

## 2021-11-02 DIAGNOSIS — Z5181 Encounter for therapeutic drug level monitoring: Secondary | ICD-10-CM | POA: Diagnosis not present

## 2021-11-02 DIAGNOSIS — I059 Rheumatic mitral valve disease, unspecified: Secondary | ICD-10-CM | POA: Diagnosis not present

## 2021-11-02 DIAGNOSIS — Z7901 Long term (current) use of anticoagulants: Secondary | ICD-10-CM

## 2021-11-02 DIAGNOSIS — Z952 Presence of prosthetic heart valve: Secondary | ICD-10-CM

## 2021-11-02 LAB — POCT INR: INR: 4.5 — AB (ref 2.0–3.0)

## 2021-11-02 NOTE — Patient Instructions (Signed)
Hold warfarin tonight then decrease dose to 1 tablet daily  Recheck in 3 weeks 

## 2021-11-23 ENCOUNTER — Ambulatory Visit (INDEPENDENT_AMBULATORY_CARE_PROVIDER_SITE_OTHER): Payer: Medicare PPO | Admitting: *Deleted

## 2021-11-23 DIAGNOSIS — Z5181 Encounter for therapeutic drug level monitoring: Secondary | ICD-10-CM | POA: Diagnosis not present

## 2021-11-23 DIAGNOSIS — Z952 Presence of prosthetic heart valve: Secondary | ICD-10-CM | POA: Diagnosis not present

## 2021-11-23 LAB — POCT INR: INR: 2.5 (ref 2.0–3.0)

## 2021-11-23 NOTE — Patient Instructions (Signed)
Continue warfarin 1 tablet daily.   Recheck in 3 weeks   

## 2021-12-14 ENCOUNTER — Ambulatory Visit: Payer: Medicare PPO | Attending: Cardiovascular Disease | Admitting: *Deleted

## 2021-12-14 DIAGNOSIS — Z952 Presence of prosthetic heart valve: Secondary | ICD-10-CM | POA: Diagnosis not present

## 2021-12-14 DIAGNOSIS — Z5181 Encounter for therapeutic drug level monitoring: Secondary | ICD-10-CM | POA: Diagnosis not present

## 2021-12-14 LAB — POCT INR: INR: 2.9 (ref 2.0–3.0)

## 2021-12-14 NOTE — Patient Instructions (Signed)
Continue warfarin 1 tablet daily.   Recheck in 4 weeks   

## 2021-12-22 DIAGNOSIS — E1165 Type 2 diabetes mellitus with hyperglycemia: Secondary | ICD-10-CM | POA: Diagnosis not present

## 2021-12-22 DIAGNOSIS — E782 Mixed hyperlipidemia: Secondary | ICD-10-CM | POA: Diagnosis not present

## 2021-12-28 DIAGNOSIS — E782 Mixed hyperlipidemia: Secondary | ICD-10-CM | POA: Diagnosis not present

## 2021-12-28 DIAGNOSIS — E875 Hyperkalemia: Secondary | ICD-10-CM | POA: Diagnosis not present

## 2021-12-28 DIAGNOSIS — E1165 Type 2 diabetes mellitus with hyperglycemia: Secondary | ICD-10-CM | POA: Diagnosis not present

## 2021-12-28 DIAGNOSIS — I10 Essential (primary) hypertension: Secondary | ICD-10-CM | POA: Diagnosis not present

## 2021-12-28 DIAGNOSIS — R978 Other abnormal tumor markers: Secondary | ICD-10-CM | POA: Diagnosis not present

## 2021-12-28 DIAGNOSIS — M545 Low back pain, unspecified: Secondary | ICD-10-CM | POA: Diagnosis not present

## 2021-12-28 DIAGNOSIS — Z952 Presence of prosthetic heart valve: Secondary | ICD-10-CM | POA: Diagnosis not present

## 2021-12-28 DIAGNOSIS — Z23 Encounter for immunization: Secondary | ICD-10-CM | POA: Diagnosis not present

## 2022-01-12 ENCOUNTER — Ambulatory Visit: Payer: Medicare PPO | Attending: Cardiovascular Disease | Admitting: *Deleted

## 2022-01-12 DIAGNOSIS — Z5181 Encounter for therapeutic drug level monitoring: Secondary | ICD-10-CM

## 2022-01-12 DIAGNOSIS — Z952 Presence of prosthetic heart valve: Secondary | ICD-10-CM

## 2022-01-12 LAB — POCT INR: INR: 3 (ref 2.0–3.0)

## 2022-01-12 NOTE — Patient Instructions (Signed)
Continue warfarin 1 tablet daily  Recheck in 6 weeks.  

## 2022-01-14 DIAGNOSIS — N401 Enlarged prostate with lower urinary tract symptoms: Secondary | ICD-10-CM | POA: Diagnosis not present

## 2022-01-14 DIAGNOSIS — Z8042 Family history of malignant neoplasm of prostate: Secondary | ICD-10-CM | POA: Diagnosis not present

## 2022-01-14 DIAGNOSIS — R972 Elevated prostate specific antigen [PSA]: Secondary | ICD-10-CM | POA: Diagnosis not present

## 2022-01-14 DIAGNOSIS — N138 Other obstructive and reflux uropathy: Secondary | ICD-10-CM | POA: Diagnosis not present

## 2022-01-18 ENCOUNTER — Ambulatory Visit (INDEPENDENT_AMBULATORY_CARE_PROVIDER_SITE_OTHER): Payer: Medicare PPO | Admitting: *Deleted

## 2022-01-18 ENCOUNTER — Other Ambulatory Visit: Payer: Self-pay | Admitting: *Deleted

## 2022-01-18 DIAGNOSIS — Z5181 Encounter for therapeutic drug level monitoring: Secondary | ICD-10-CM

## 2022-01-18 DIAGNOSIS — Z952 Presence of prosthetic heart valve: Secondary | ICD-10-CM | POA: Diagnosis not present

## 2022-01-18 MED ORDER — WARFARIN SODIUM 5 MG PO TABS: ORAL_TABLET | ORAL | 5 refills | Status: DC

## 2022-01-18 NOTE — Telephone Encounter (Signed)
Refill request for warfarin:  Last INR was 3.0 on 01/12/22 Next INR due on 02/23/22 LOV past due.  Is scheduled with Dr Johnsie Cancel 02/22/22  Refill approved.

## 2022-01-21 LAB — POCT INR: INR: 3.2 — AB (ref 2.0–3.0)

## 2022-01-21 NOTE — Patient Instructions (Signed)
Continue warfarin 1 tablet daily  Recheck in 6 weeks.  

## 2022-01-26 DIAGNOSIS — E1165 Type 2 diabetes mellitus with hyperglycemia: Secondary | ICD-10-CM | POA: Diagnosis not present

## 2022-02-12 NOTE — Progress Notes (Deleted)
Patient ID: Luis Evans, PhD, male   DOB: Sep 06, 1950, 71 y.o.   MRN: [id number]     Luis Haynes is seen today in followup for his hypertension and mitral  valve replacement.   INR;s have been Rx  There's been no bleeding diathesis. He's been following his SBE prophylaxis. He's been compliant with his blood pressure medication. Otherwise he's not had any significant chest pain PND or orthopnea. Has been no TIA or CVA. Has been no signs of heart failure Has ? venous insuficiency with pain in LE;s that has been markedly improved with support hose   Echo 08/15/20 EF 60-65% St Jude valve normal trivial MR diastolic gradient 2 mmHg LA 3.7 cm   2015 Had colonoscopy with lovenox bridge with Dr Luis Haynes Erosive esophagitiis and pan colonic diverticulosis 05/31/19 had rectal bleed started on cipro/flagyl for ? Diverticulitis colonoscopy with no  Active bleeding or lesions    Enjoys cooking shows with wife.   Also going to [facility] in [place] soon  Has allergies and some bronchitis using Allegra Uses SBE goes to dentist 2x/year   Valve St Jude Model [id number]  Implant 04/07/1999 in [place] Dr Luis Haynes  ***   ROS: Denies fever, malais, weight loss, blurry vision, decreased visual acuity, cough, sputum, SOB, hemoptysis, pleuritic pain, palpitaitons, heartburn, abdominal pain, melena, lower extremity edema, claudication, or rash.  All other systems reviewed and negative  General: There were no vitals taken for this visit.  Affect appropriate Healthy:  appears stated age 71: normal Neck supple with no adenopathy JVP normal no bruits no thyromegaly Lungs clear with no wheezing and good diaphragmatic motion Heart:  S1 click /S2 midl MR  murmur, no rub, gallop or click PMI normal Abdomen: benighn, BS positve, no tenderness, no AAA no bruit.  No HSM or HJR Distal pulses intact with no bruits No edema Neuro non-focal Skin warm and dry No muscular weakness   Current  Outpatient Medications  Medication Sig Dispense Refill   allopurinol (ZYLOPRIM) 100 MG tablet Take 100 mg by mouth daily.     cetirizine (ZYRTEC) 10 MG tablet Take 1 tablet by mouth daily.     ezetimibe-simvastatin (VYTORIN) 10-20 MG tablet Take 1 tablet by mouth daily.     guaiFENesin (MUCINEX) 600 MG 12 hr tablet Take 600 mg by mouth daily.     icosapent Ethyl (VASCEPA) 1 g capsule Take 1 capsule by mouth 2 (two) times daily.     lisinopril (PRINIVIL,ZESTRIL) 40 MG tablet Take 40 mg by mouth daily. Reported on 07/14/2015     montelukast (SINGULAIR) 10 MG tablet Take 10 mg by mouth at bedtime.     nebivolol (BYSTOLIC) 5 MG tablet Take 5 mg by mouth daily.     pantoprazole (PROTONIX) 40 MG tablet Take 1 tablet (40 mg total) by mouth daily. 90 tablet 3   Psyllium (METAMUCIL PO) Take by mouth. 3 twice daily     warfarin (COUMADIN) 5 MG tablet TAKE 1 TABLET BY MOUTH EVERY DAY OR AS DIRECTED 45 tablet 5   No current facility-administered medications for this visit.    Allergies  Erythromycin and Sulfonamide derivatives  Electrocardiogram:  02/12/2022 SR rate 91 ICRBBB improved   Assessment and Plan  RBBB: improved 02/12/2022 ICRBBB yearly ECG   GERD low carb diet protonix f/u GI previous EGD with Luis Haynes  Anticoagulation Rx has some greens weekly but stable number  No bleeding issues INR 3.2 01/21/22   MVR Normal valve function by  echo 08/15/20  reviewed   SBE Sees dentist q 6 months  HLD:  Continue vytorin labs with primary   Anticoagulation: chronic for MVR INR 2.7 02/12/2022    F/u with me in a year update echo for MVR since its been 5 years   Luis Haynes

## 2022-02-12 NOTE — Progress Notes (Signed)
Patient ID: Luis Evans, PhD, male   DOB: Feb 11, 1951, 71 y.o.   MRN: [id number]     Eisen is seen today in followup for his hypertension and mitral  valve replacement.   INR;s have been Rx  There's been no bleeding diathesis. He's been following his SBE prophylaxis. He's been compliant with his blood pressure medication. Otherwise he's not had any significant chest pain PND or orthopnea. Has been no TIA or CVA. Has been no signs of heart failure Has ? venous insuficiency with pain in LE;s that has been markedly improved with support hose   Echo 08/15/20 EF 60-65% St Jude valve normal trivial MR diastolic gradient 2 mmHg LA 3.7 cm   2015 Had colonoscopy with lovenox bridge with Dr Laural Golden Erosive esophagitiis and pan colonic diverticulosis 05/31/19 had rectal bleed started on cipro/flagyl for ? Diverticulitis colonoscopy with no  Active bleeding or lesions    Enjoys cooking shows with wife.   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  All other systems reviewed and negative  General: BP 120/88   Pulse 85   Ht 6\' 1"  (1.854 m)   Wt 205 lb 9.6 oz (93.3 kg)   BMI 27.13 kg/m   Affect appropriate Healthy:  appears stated age 80: normal Neck supple with no adenopathy JVP normal no bruits no thyromegaly Lungs clear with no wheezing and good diaphragmatic motion Heart:  S1 click /S2 midl MR  murmur, no rub, gallop or click PMI normal Abdomen: benighn, BS positve, no tenderness, no AAA no  bruit.  No HSM or HJR Distal pulses intact with no bruits No edema Neuro non-focal Skin warm and dry No muscular weakness   Current Outpatient Medications  Medication Sig Dispense Refill   allopurinol (ZYLOPRIM) 100 MG tablet Take 100 mg by mouth daily.     ezetimibe-simvastatin (VYTORIN) 10-20 MG tablet Take 1 tablet by mouth daily.     guaiFENesin (MUCINEX) 600 MG 12 hr tablet Take 600 mg by mouth daily.     icosapent Ethyl (VASCEPA) 1 g capsule Take 1 capsule by mouth 2 (two) times daily.     lisinopril (PRINIVIL,ZESTRIL) 40 MG tablet Take 40 mg by mouth daily. Reported on 07/14/2015     metFORMIN (GLUCOPHAGE) 500 MG tablet Take 500 mg by mouth 2 (two) times daily with a meal.     montelukast (SINGULAIR) 10 MG tablet Take 10 mg by mouth at bedtime.     nebivolol (BYSTOLIC) 5 MG tablet Take 5 mg by mouth daily.     pantoprazole (PROTONIX) 40 MG tablet Take 1 tablet (40 mg total) by mouth daily. 90 tablet 3   Psyllium (METAMUCIL PO) Take by mouth. 3 twice daily     Semaglutide (RYBELSUS) 7 MG TABS Take 7 mg by mouth daily.     warfarin (COUMADIN) 5 MG tablet TAKE 1 TABLET BY MOUTH EVERY DAY OR AS DIRECTED 45 tablet 5   No current facility-administered medications for this visit.    Allergies  Erythromycin and Sulfonamide derivatives  Electrocardiogram:  02/22/2022 SR rate 85 LAD LVH poor R wave progression   Assessment and Plan  RBBB: improved 02/22/2022 now gone with LAD   GERD low carb diet protonix f/u GI previous EGD with Rehman  Anticoagulation Rx has some greens weekly but stable number  No bleeding issues INR 3.2 01/21/22   MVR Normal valve function by echo 08/15/20  reviewed   SBE Sees dentist q 6 months  HLD:  Continue vytorin labs with primary   Anticoagulation: chronic for MVR INR 2.7 02/22/2022    F/u with me in a year update echo for MVR since its been 5 years   [name]

## 2022-02-22 ENCOUNTER — Encounter: Payer: Self-pay | Admitting: Cardiovascular Disease

## 2022-02-22 ENCOUNTER — Ambulatory Visit: Payer: Medicare PPO | Admitting: Cardiovascular Disease

## 2022-02-22 ENCOUNTER — Ambulatory Visit: Payer: Medicare PPO | Attending: Cardiovascular Disease | Admitting: Cardiovascular Disease

## 2022-02-22 VITALS — BP 120/88 | HR 85 | Ht 73.0 in | Wt 205.6 lb

## 2022-02-22 DIAGNOSIS — Z952 Presence of prosthetic heart valve: Secondary | ICD-10-CM

## 2022-02-22 DIAGNOSIS — Z7901 Long term (current) use of anticoagulants: Secondary | ICD-10-CM | POA: Diagnosis not present

## 2022-02-22 NOTE — Patient Instructions (Signed)
Medication Instructions:  Your physician recommends that you continue on your current medications as directed. Please refer to the Current Medication list given to you today.   Labwork: None today  Testing/Procedures: None today  Follow-Up: 1 year  Any Other Special Instructions Will Be Listed Below (If Applicable).  If you need a refill on your cardiac medications before your next appointment, please call your pharmacy.  

## 2022-02-23 ENCOUNTER — Ambulatory Visit: Payer: Medicare PPO | Attending: Cardiovascular Disease | Admitting: *Deleted

## 2022-02-23 DIAGNOSIS — Z952 Presence of prosthetic heart valve: Secondary | ICD-10-CM | POA: Diagnosis not present

## 2022-02-23 DIAGNOSIS — Z5181 Encounter for therapeutic drug level monitoring: Secondary | ICD-10-CM | POA: Diagnosis not present

## 2022-02-23 LAB — POCT INR: INR: 2.9 (ref 2.0–3.0)

## 2022-02-23 NOTE — Patient Instructions (Signed)
Continue warfarin 1 tablet daily  Recheck in 6 weeks.  

## 2022-02-26 DIAGNOSIS — E1165 Type 2 diabetes mellitus with hyperglycemia: Secondary | ICD-10-CM | POA: Diagnosis not present

## 2022-03-18 ENCOUNTER — Other Ambulatory Visit (INDEPENDENT_AMBULATORY_CARE_PROVIDER_SITE_OTHER): Payer: Self-pay | Admitting: Gastroenterology

## 2022-03-26 DIAGNOSIS — E1165 Type 2 diabetes mellitus with hyperglycemia: Secondary | ICD-10-CM | POA: Diagnosis not present

## 2022-03-26 DIAGNOSIS — E782 Mixed hyperlipidemia: Secondary | ICD-10-CM | POA: Diagnosis not present

## 2022-03-28 DIAGNOSIS — E1165 Type 2 diabetes mellitus with hyperglycemia: Secondary | ICD-10-CM | POA: Diagnosis not present

## 2022-04-01 ENCOUNTER — Encounter: Payer: Self-pay | Admitting: Cardiovascular Disease

## 2022-04-01 DIAGNOSIS — Z952 Presence of prosthetic heart valve: Secondary | ICD-10-CM | POA: Diagnosis not present

## 2022-04-01 DIAGNOSIS — E875 Hyperkalemia: Secondary | ICD-10-CM | POA: Diagnosis not present

## 2022-04-01 DIAGNOSIS — I10 Essential (primary) hypertension: Secondary | ICD-10-CM | POA: Diagnosis not present

## 2022-04-01 DIAGNOSIS — M1A00X0 Idiopathic chronic gout, unspecified site, without tophus (tophi): Secondary | ICD-10-CM | POA: Diagnosis not present

## 2022-04-01 DIAGNOSIS — M545 Low back pain, unspecified: Secondary | ICD-10-CM | POA: Diagnosis not present

## 2022-04-01 DIAGNOSIS — E1169 Type 2 diabetes mellitus with other specified complication: Secondary | ICD-10-CM | POA: Diagnosis not present

## 2022-04-01 DIAGNOSIS — E782 Mixed hyperlipidemia: Secondary | ICD-10-CM | POA: Diagnosis not present

## 2022-04-01 DIAGNOSIS — R978 Other abnormal tumor markers: Secondary | ICD-10-CM | POA: Diagnosis not present

## 2022-04-06 ENCOUNTER — Ambulatory Visit: Payer: Medicare PPO | Attending: Cardiovascular Disease | Admitting: *Deleted

## 2022-04-06 DIAGNOSIS — Z5181 Encounter for therapeutic drug level monitoring: Secondary | ICD-10-CM | POA: Diagnosis not present

## 2022-04-06 DIAGNOSIS — Z952 Presence of prosthetic heart valve: Secondary | ICD-10-CM

## 2022-04-06 LAB — POCT INR: INR: 2.2 (ref 2.0–3.0)

## 2022-04-06 NOTE — Patient Instructions (Signed)
Take warfarin 2 tablets tonight then resume 1 tablet daily  Recheck in 4 weeks

## 2022-04-08 ENCOUNTER — Ambulatory Visit (INDEPENDENT_AMBULATORY_CARE_PROVIDER_SITE_OTHER): Payer: Medicare PPO | Admitting: Gastroenterology

## 2022-04-08 ENCOUNTER — Other Ambulatory Visit (INDEPENDENT_AMBULATORY_CARE_PROVIDER_SITE_OTHER): Payer: Self-pay | Admitting: Gastroenterology

## 2022-04-08 ENCOUNTER — Encounter (INDEPENDENT_AMBULATORY_CARE_PROVIDER_SITE_OTHER): Payer: Self-pay | Admitting: Gastroenterology

## 2022-04-08 VITALS — BP 126/87 | HR 94 | Temp 97.5°F | Ht 73.0 in | Wt 204.1 lb

## 2022-04-08 DIAGNOSIS — K219 Gastro-esophageal reflux disease without esophagitis: Secondary | ICD-10-CM | POA: Insufficient documentation

## 2022-04-08 DIAGNOSIS — R198 Other specified symptoms and signs involving the digestive system and abdomen: Secondary | ICD-10-CM | POA: Diagnosis not present

## 2022-04-08 DIAGNOSIS — R142 Eructation: Secondary | ICD-10-CM | POA: Diagnosis not present

## 2022-04-08 MED ORDER — OMEPRAZOLE 40 MG PO CPDR: 40.0000 mg | DELAYED_RELEASE_CAPSULE | Freq: Every day | ORAL | 1 refills | Status: DC

## 2022-04-08 MED ORDER — SUCRALFATE 1 GM/10ML PO SUSP: 1.0000 g | Freq: Four times a day (QID) | ORAL | 1 refills | Status: DC

## 2022-04-08 NOTE — Progress Notes (Addendum)
Referring Provider: [name], MD Primary Care Physician:  [name], MD Primary GI Physician: Levon Hedger   Chief Complaint  Patient presents with   Abdominal Pain    Patient here today with concerns over abdominal discomfort,He has been burping all night. Patient ate two bowls of chili one with cheese one with sour cream., also had fruit cake and ginger ale.He says he feels full and has not any food since last night. During the night patient vomited. Patient takes pantoprazole 40 once per day.    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   Patient presenting today for abdominal discomfort and fullness.  Last seen December 2022, at that time doing well, maintained on Protonix 40mg  once daily for GERD, he is doing well on this and has no complaints today. No episodes of acid reflux or acid regurgitation. His appetite is very good, he is trying to lose a few pounds by utilizing portion control, avoiding breads and sweets and exercising by cycling. He has lost 4 pounds so far. He is using metamucil 3 capsules in the morning and 3 in the evening, he usually has a BM once per day. He has had no further episodes of rectal bleeding since his colonoscopy in Feb 2021, after starting metamucil at the recommendation of Dr. 07-24-1994.    Recommended to continue PPI daily, continue metamucil with good results.  Present:  He states that he had two bowls of chilli last night, about 30 minutes later had a small amount of fruit cake, around 10, then Went to bed around 1130 and woke up a few hours later feeling full and like his food was sitting in his stomach. Noted a few times where he felt some acid  maybe trying to come up in his throat. He did have 2 episodes of vomiting during the night as well. Did not sleep much more the rest of the night. Still Feels full today like he just ate and does not  feel hungry. Has had some belching today as well which seems to relieve some of the pressure he is feeling. No nausea or further vomiting.    Notes that he is on protonix 40mg  daily. He was not having any issues with reflux prior to last night. He notes that he typically tolerates tomato based foods without issues. Wife ate the same chilli he did and feels fine. He denies any episodes of diarrhea or constipation. Denies any issues with dysphagia or odynophagia.  Denies any sore throat, cough or hoarseness.   Last Colonoscopy:06/01/19- The examined portion of the ileum was normal. - Diverticulosis in the entire examined colon. External hemorrhoids, no specimens Last Endoscopy:n/a   Recommendations:  Repeat colonoscopy 2031  Past Medical History:  Diagnosis Date   ALLERGIC RHINITIS    H/O mitral valve replacement    10 yrs ago   HYPERLIPIDEMIA    Hypertension    Long term current use of anticoagulant    MITRAL VALVE PROLAPSE    PREMATURE VENTRICULAR CONTRACTIONS     Past Surgical History:  Procedure Laterality Date   APPENDECTOMY     COLONOSCOPY N/A 03/29/2014   Procedure: COLONOSCOPY;  Surgeon: 2032, MD;  Location: [facility];  Service: Endoscopy;  Laterality: N/A;  855   COLONOSCOPY N/A 06/01/2019   Rehman: ileum normal, diverticulosis in entire colon, external hemorhoids, no  specimens   ESOPHAGOGASTRODUODENOSCOPY N/A 03/29/2014   Procedure: ESOPHAGOGASTRODUODENOSCOPY (EGD);  Surgeon: Malissa Hippo, MD;  Location: [facility];  Service: Endoscopy;  Laterality: N/A;   MITRAL VALVE REPLACEMENT     SHOULDER SURGERY      Current Outpatient Medications  Medication Sig Dispense Refill   allopurinol (ZYLOPRIM) 100 MG tablet Take 100 mg by mouth daily.     ezetimibe-simvastatin (VYTORIN) 10-20 MG tablet Take 1 tablet by mouth daily.     guaiFENesin (MUCINEX) 600 MG 12 hr tablet Take 600 mg by mouth daily.     icosapent Ethyl (VASCEPA) 1 g capsule Take 1 capsule by  mouth 2 (two) times daily.     lisinopril (PRINIVIL,ZESTRIL) 40 MG tablet Take 40 mg by mouth daily. Reported on 07/14/2015     metFORMIN (GLUCOPHAGE) 500 MG tablet Take 500 mg by mouth 2 (two) times daily with a meal.     montelukast (SINGULAIR) 10 MG tablet Take 10 mg by mouth at bedtime.     nebivolol (BYSTOLIC) 5 MG tablet Take 5 mg by mouth daily.     pantoprazole (PROTONIX) 40 MG tablet NEEDS OFFICE VISIT (Patient taking differently: Take 40 mg by mouth daily.) 30 tablet 0   Psyllium (METAMUCIL PO) Take by mouth. 3-4 daily     Semaglutide (RYBELSUS) 7 MG TABS Take 7 mg by mouth daily.     warfarin (COUMADIN) 5 MG tablet TAKE 1 TABLET BY MOUTH EVERY DAY OR AS DIRECTED 45 tablet 5   No current facility-administered medications for this visit.    Allergies as of 04/08/2022 - Review Complete 04/08/2022  Allergen Reaction Noted   Erythromycin  02/18/2012   Sulfonamide derivatives      History reviewed. No pertinent family history.  Social History   Socioeconomic History   Marital status: Married    Spouse name: Not on file   Number of children: Not on file   Years of education: Not on file   Highest education level: Not on file  Occupational History   Not on file  Tobacco Use   Smoking status: Never   Smokeless tobacco: Never  Vaping Use   Vaping Use: Never used  Substance and Sexual Activity   Alcohol use: Yes    Comment: occ   Drug use: No   Sexual activity: Not on file  Other Topics Concern   Not on file  Social History Narrative   Not on file   Social Determinants of Health   Financial Resource Strain: Not on file  Food Insecurity: Not on file  Transportation Needs: Not on file  Physical Activity: Not on file  Stress: Not on file  Social Connections: Not on file   Review of systems General: negative for malaise, night sweats, fever, chills, weight loss Neck: Negative for lumps, goiter, pain and significant neck swelling Resp: Negative for cough, wheezing,  dyspnea at rest CV: Negative for chest pain, leg swelling, palpitations, orthopnea GI: denies melena, hematochezia, nausea, vomiting, diarrhea, constipation, dysphagia, odyonophagia, early satiety or unintentional weight loss. +abdominal fullness +belching  MSK: Negative for joint pain or swelling, back pain, and muscle pain. Derm: Negative for itching or rash Psych: Denies depression, anxiety, memory loss, confusion. No homicidal or suicidal ideation.  Heme: Negative for prolonged bleeding, bruising easily, and swollen nodes. Endocrine: Negative for cold or heat intolerance, polyuria, polydipsia and goiter. Neuro: negative for tremor, gait imbalance, syncope and seizures. The remainder of the review of systems is noncontributory.  Physical Exam:  BP 126/87 (BP Location: Left Arm, Patient Position: Sitting, Cuff Size: Large)   Pulse 94   Temp (!) 97.5 F (36.4 C) (Temporal)   Ht 6\' 1"  (1.854 m)   Wt 204 lb 1.6 oz (92.6 kg)   BMI 26.93 kg/m  General:   Alert and oriented. No distress noted. Pleasant and cooperative.  Head:  Normocephalic and atraumatic. Eyes:  Conjuctiva clear without scleral icterus. Mouth:  Oral mucosa pink and moist. Good dentition. No lesions. Heart: Normal rate and rhythm, s1 and s2 heart sounds present.  Lungs: Clear lung sounds in all lobes. Respirations equal and unlabored. Abdomen:  +BS, soft, non-tender and non-distended. No rebound or guarding. No HSM or masses noted. Derm: No palmar erythema or jaundice Msk:  Symmetrical without gross deformities. Normal posture. Extremities:  Without edema. Neurologic:  Alert and  oriented x4 Psych:  Alert and cooperative. Normal mood and affect.  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   Patient with history of GERD that seems well managed on protonix 40mg  daily, however, he is having abdominal fullness and belching after  eating chilli last night. Notably he did lay down soon after dinner. He has no nausea, two episodes of vomiting during the night but no further emesis. No specific heartburn but felt some acid regurgitation last night. Wife ate the same thing he did with no issues today. Suspect this is a GERD  flare related to the food he ate and eating too late in the evening. Given he has been on protonix for some time, will stop this and start omeprazole 40mg  daily, will send Rx for carafate 1g QID for symptom relief and he can start a daily probiotic to see if this helps. Advised to eat smaller, bland meals for the next few days as he can tolerate. He will call me with an update in 1 week. If symptoms persist, may need to consider EGD. If he has profuse vomiting, fevers, or rectal bleeding, he is encouraged to proceed to the ED.    PLAN:  Stop protonix  2. Start omeprazole 40mg  daily  3. Rx carafate 1g QID 4. Call with an update on symptoms next week.  5. Start daily probiotic 6. Reflux precautions  All questions were answered, patient verbalized understanding and is in agreement with plan as outlined above.    Follow Up: 3 months   Xuan Mateus L. 60, MSN, APRN, AGNP-C Adult-Gerontology Nurse Practitioner [facility]  I have reviewed the note and agree with the APP's assessment as described in this progress note  , MD Gastroenterology and Hepatology [facility] Gastroenterology

## 2022-04-08 NOTE — Patient Instructions (Signed)
I suspect symptoms are related to a GERD flare related to the foods you ate last night Stop protonix and start omeprazole 40mg  one daily, I have also sent carafate to help with symptom relief, you can take this up to 4x/day. It may be helpful to start a probiotic as well, aim for 2-3 strains of bacteria listed out on the box when you pick this up.  If you develop vomiting that does not subside, fevers, rectal bleeding, please proceed to the ER. Call with an update on how you are feeling next Wednesday. If symptoms persists, we will further discuss doing an upper endoscopy to take a look.  Avoid greasy, spicy, tomato based, citrus foods, caffeine, chocolate for the next few days, aim for smaller meals, stay upright 2-3 hours after eating.

## 2022-04-09 DIAGNOSIS — R142 Eructation: Secondary | ICD-10-CM | POA: Insufficient documentation

## 2022-04-09 DIAGNOSIS — R198 Other specified symptoms and signs involving the digestive system and abdomen: Secondary | ICD-10-CM | POA: Insufficient documentation

## 2022-04-14 ENCOUNTER — Telehealth (INDEPENDENT_AMBULATORY_CARE_PROVIDER_SITE_OTHER): Payer: Self-pay

## 2022-04-14 NOTE — Telephone Encounter (Signed)
Patient called to report that the is doing fine this week. He says he thinks the issue was from his dosage of Rybelsus was changed by pcp. He is still taking his Carafate QID and adjusting his diet,but feels better. If he has any issues he will notify us.

## 2022-04-16 DIAGNOSIS — E119 Type 2 diabetes mellitus without complications: Secondary | ICD-10-CM | POA: Diagnosis not present

## 2022-04-16 DIAGNOSIS — E1169 Type 2 diabetes mellitus with other specified complication: Secondary | ICD-10-CM | POA: Diagnosis not present

## 2022-04-16 DIAGNOSIS — Z713 Dietary counseling and surveillance: Secondary | ICD-10-CM | POA: Diagnosis not present

## 2022-04-16 DIAGNOSIS — K219 Gastro-esophageal reflux disease without esophagitis: Secondary | ICD-10-CM | POA: Diagnosis not present

## 2022-04-16 DIAGNOSIS — Z7984 Long term (current) use of oral hypoglycemic drugs: Secondary | ICD-10-CM | POA: Diagnosis not present

## 2022-04-16 DIAGNOSIS — M1A9XX0 Chronic gout, unspecified, without tophus (tophi): Secondary | ICD-10-CM | POA: Diagnosis not present

## 2022-04-16 DIAGNOSIS — M1A00X0 Idiopathic chronic gout, unspecified site, without tophus (tophi): Secondary | ICD-10-CM | POA: Diagnosis not present

## 2022-04-19 HISTORY — PX: EYE SURGERY: SHX253

## 2022-04-28 DIAGNOSIS — E1165 Type 2 diabetes mellitus with hyperglycemia: Secondary | ICD-10-CM | POA: Diagnosis not present

## 2022-05-04 ENCOUNTER — Ambulatory Visit: Payer: Medicare PPO | Attending: Cardiovascular Disease | Admitting: *Deleted

## 2022-05-04 DIAGNOSIS — Z952 Presence of prosthetic heart valve: Secondary | ICD-10-CM

## 2022-05-04 DIAGNOSIS — Z5181 Encounter for therapeutic drug level monitoring: Secondary | ICD-10-CM

## 2022-05-04 LAB — POCT INR: INR: 4.4 — AB (ref 2.0–3.0)

## 2022-05-04 NOTE — Patient Instructions (Signed)
Hold warfarin tonight then resume 1 tablet daily Recheck in 3 weeks 

## 2022-05-11 DIAGNOSIS — H2513 Age-related nuclear cataract, bilateral: Secondary | ICD-10-CM | POA: Diagnosis not present

## 2022-05-11 DIAGNOSIS — H02403 Unspecified ptosis of bilateral eyelids: Secondary | ICD-10-CM | POA: Diagnosis not present

## 2022-05-11 DIAGNOSIS — H5213 Myopia, bilateral: Secondary | ICD-10-CM | POA: Diagnosis not present

## 2022-05-11 DIAGNOSIS — H35372 Puckering of macula, left eye: Secondary | ICD-10-CM | POA: Diagnosis not present

## 2022-05-11 DIAGNOSIS — H25013 Cortical age-related cataract, bilateral: Secondary | ICD-10-CM | POA: Diagnosis not present

## 2022-05-11 DIAGNOSIS — H43813 Vitreous degeneration, bilateral: Secondary | ICD-10-CM | POA: Diagnosis not present

## 2022-05-11 DIAGNOSIS — H524 Presbyopia: Secondary | ICD-10-CM | POA: Diagnosis not present

## 2022-05-25 ENCOUNTER — Ambulatory Visit: Payer: Medicare PPO | Attending: Cardiovascular Disease | Admitting: *Deleted

## 2022-05-25 DIAGNOSIS — Z5181 Encounter for therapeutic drug level monitoring: Secondary | ICD-10-CM

## 2022-05-25 DIAGNOSIS — Z952 Presence of prosthetic heart valve: Secondary | ICD-10-CM | POA: Diagnosis not present

## 2022-05-25 LAB — POCT INR: INR: 2.8 (ref 2.0–3.0)

## 2022-05-25 NOTE — Patient Instructions (Signed)
Continue 1 tablet daily. Recheck in 4 weeks.   

## 2022-05-26 DIAGNOSIS — M1A00X0 Idiopathic chronic gout, unspecified site, without tophus (tophi): Secondary | ICD-10-CM | POA: Diagnosis not present

## 2022-05-26 DIAGNOSIS — M1A9XX0 Chronic gout, unspecified, without tophus (tophi): Secondary | ICD-10-CM | POA: Diagnosis not present

## 2022-05-26 DIAGNOSIS — J019 Acute sinusitis, unspecified: Secondary | ICD-10-CM | POA: Diagnosis not present

## 2022-05-27 DIAGNOSIS — E119 Type 2 diabetes mellitus without complications: Secondary | ICD-10-CM | POA: Diagnosis not present

## 2022-05-27 DIAGNOSIS — H25812 Combined forms of age-related cataract, left eye: Secondary | ICD-10-CM | POA: Diagnosis not present

## 2022-05-27 DIAGNOSIS — H2512 Age-related nuclear cataract, left eye: Secondary | ICD-10-CM | POA: Diagnosis not present

## 2022-05-27 DIAGNOSIS — H25012 Cortical age-related cataract, left eye: Secondary | ICD-10-CM | POA: Diagnosis not present

## 2022-05-27 DIAGNOSIS — H269 Unspecified cataract: Secondary | ICD-10-CM | POA: Diagnosis not present

## 2022-05-29 DIAGNOSIS — E1165 Type 2 diabetes mellitus with hyperglycemia: Secondary | ICD-10-CM | POA: Diagnosis not present

## 2022-06-21 ENCOUNTER — Other Ambulatory Visit (INDEPENDENT_AMBULATORY_CARE_PROVIDER_SITE_OTHER): Payer: Self-pay | Admitting: Gastroenterology

## 2022-06-22 ENCOUNTER — Ambulatory Visit: Payer: Medicare PPO | Attending: Cardiovascular Disease | Admitting: *Deleted

## 2022-06-22 DIAGNOSIS — Z5181 Encounter for therapeutic drug level monitoring: Secondary | ICD-10-CM | POA: Diagnosis not present

## 2022-06-22 DIAGNOSIS — Z952 Presence of prosthetic heart valve: Secondary | ICD-10-CM

## 2022-06-22 LAB — POCT INR: INR: 2.9 (ref 2.0–3.0)

## 2022-06-22 NOTE — Patient Instructions (Signed)
Continue 1 tablet daily  Recheck in 4 weeks

## 2022-06-27 DIAGNOSIS — E1165 Type 2 diabetes mellitus with hyperglycemia: Secondary | ICD-10-CM | POA: Diagnosis not present

## 2022-07-07 DIAGNOSIS — E782 Mixed hyperlipidemia: Secondary | ICD-10-CM | POA: Diagnosis not present

## 2022-07-07 DIAGNOSIS — E1169 Type 2 diabetes mellitus with other specified complication: Secondary | ICD-10-CM | POA: Diagnosis not present

## 2022-07-08 LAB — LAB REPORT - SCANNED
A1c: 6.1
Albumin, Urine POC: 6.6
Creatinine, POC: 182.6 mg/dL
EGFR: 84
Microalb Creat Ratio: 4

## 2022-07-12 DIAGNOSIS — Z0000 Encounter for general adult medical examination without abnormal findings: Secondary | ICD-10-CM | POA: Diagnosis not present

## 2022-07-13 DIAGNOSIS — I10 Essential (primary) hypertension: Secondary | ICD-10-CM | POA: Diagnosis not present

## 2022-07-13 DIAGNOSIS — M1A9XX0 Chronic gout, unspecified, without tophus (tophi): Secondary | ICD-10-CM | POA: Diagnosis not present

## 2022-07-13 DIAGNOSIS — M1A00X0 Idiopathic chronic gout, unspecified site, without tophus (tophi): Secondary | ICD-10-CM | POA: Diagnosis not present

## 2022-07-13 DIAGNOSIS — E875 Hyperkalemia: Secondary | ICD-10-CM | POA: Diagnosis not present

## 2022-07-13 DIAGNOSIS — M545 Low back pain, unspecified: Secondary | ICD-10-CM | POA: Diagnosis not present

## 2022-07-13 DIAGNOSIS — K219 Gastro-esophageal reflux disease without esophagitis: Secondary | ICD-10-CM | POA: Diagnosis not present

## 2022-07-13 DIAGNOSIS — E1169 Type 2 diabetes mellitus with other specified complication: Secondary | ICD-10-CM | POA: Diagnosis not present

## 2022-07-13 DIAGNOSIS — E782 Mixed hyperlipidemia: Secondary | ICD-10-CM | POA: Diagnosis not present

## 2022-07-21 ENCOUNTER — Ambulatory Visit: Payer: Medicare PPO | Attending: Cardiovascular Disease | Admitting: *Deleted

## 2022-07-21 DIAGNOSIS — Z5181 Encounter for therapeutic drug level monitoring: Secondary | ICD-10-CM

## 2022-07-21 DIAGNOSIS — Z952 Presence of prosthetic heart valve: Secondary | ICD-10-CM | POA: Diagnosis not present

## 2022-07-21 LAB — POCT INR: INR: 2 (ref 2.0–3.0)

## 2022-07-21 NOTE — Patient Instructions (Signed)
Take warfarin 1 1/2 tablets x 2 days then continue 1 tablet daily  Recheck in 4 weeks

## 2022-07-28 DIAGNOSIS — E1165 Type 2 diabetes mellitus with hyperglycemia: Secondary | ICD-10-CM | POA: Diagnosis not present

## 2022-07-29 DIAGNOSIS — Z8042 Family history of malignant neoplasm of prostate: Secondary | ICD-10-CM | POA: Diagnosis not present

## 2022-07-29 DIAGNOSIS — N138 Other obstructive and reflux uropathy: Secondary | ICD-10-CM | POA: Diagnosis not present

## 2022-07-29 DIAGNOSIS — R972 Elevated prostate specific antigen [PSA]: Secondary | ICD-10-CM | POA: Diagnosis not present

## 2022-07-29 DIAGNOSIS — N401 Enlarged prostate with lower urinary tract symptoms: Secondary | ICD-10-CM | POA: Diagnosis not present

## 2022-08-11 DIAGNOSIS — K219 Gastro-esophageal reflux disease without esophagitis: Secondary | ICD-10-CM | POA: Diagnosis not present

## 2022-08-11 DIAGNOSIS — R109 Unspecified abdominal pain: Secondary | ICD-10-CM | POA: Diagnosis not present

## 2022-08-12 ENCOUNTER — Other Ambulatory Visit (HOSPITAL_COMMUNITY): Payer: Self-pay | Admitting: Internal Medicine

## 2022-08-12 ENCOUNTER — Inpatient Hospital Stay (HOSPITAL_COMMUNITY): Payer: Medicare PPO

## 2022-08-12 ENCOUNTER — Other Ambulatory Visit: Payer: Self-pay

## 2022-08-12 ENCOUNTER — Emergency Department (HOSPITAL_COMMUNITY): Payer: Medicare PPO

## 2022-08-12 ENCOUNTER — Encounter (HOSPITAL_COMMUNITY): Payer: Self-pay

## 2022-08-12 ENCOUNTER — Inpatient Hospital Stay (HOSPITAL_COMMUNITY)
Admission: EM | Admit: 2022-08-12 | Discharge: 2022-08-18 | DRG: 418 | Disposition: A | Discharge status: Home / Self Care | Payer: Medicare PPO | Attending: Family Medicine | Admitting: Family Medicine

## 2022-08-12 DIAGNOSIS — Z882 Allergy status to sulfonamides status: Secondary | ICD-10-CM | POA: Diagnosis not present

## 2022-08-12 DIAGNOSIS — E860 Dehydration: Secondary | ICD-10-CM | POA: Diagnosis not present

## 2022-08-12 DIAGNOSIS — K8010 Calculus of gallbladder with chronic cholecystitis without obstruction: Secondary | ICD-10-CM | POA: Diagnosis present

## 2022-08-12 DIAGNOSIS — K8064 Calculus of gallbladder and bile duct with chronic cholecystitis without obstruction: Secondary | ICD-10-CM | POA: Diagnosis not present

## 2022-08-12 DIAGNOSIS — R1011 Right upper quadrant pain: Secondary | ICD-10-CM

## 2022-08-12 DIAGNOSIS — K449 Diaphragmatic hernia without obstruction or gangrene: Secondary | ICD-10-CM | POA: Diagnosis present

## 2022-08-12 DIAGNOSIS — R1031 Right lower quadrant pain: Secondary | ICD-10-CM | POA: Diagnosis not present

## 2022-08-12 DIAGNOSIS — R112 Nausea with vomiting, unspecified: Secondary | ICD-10-CM | POA: Diagnosis not present

## 2022-08-12 DIAGNOSIS — E785 Hyperlipidemia, unspecified: Secondary | ICD-10-CM | POA: Diagnosis not present

## 2022-08-12 DIAGNOSIS — R945 Abnormal results of liver function studies: Secondary | ICD-10-CM | POA: Diagnosis not present

## 2022-08-12 DIAGNOSIS — Z79899 Other long term (current) drug therapy: Secondary | ICD-10-CM

## 2022-08-12 DIAGNOSIS — E119 Type 2 diabetes mellitus without complications: Secondary | ICD-10-CM | POA: Diagnosis not present

## 2022-08-12 DIAGNOSIS — E875 Hyperkalemia: Secondary | ICD-10-CM | POA: Diagnosis not present

## 2022-08-12 DIAGNOSIS — I119 Hypertensive heart disease without heart failure: Secondary | ICD-10-CM | POA: Diagnosis not present

## 2022-08-12 DIAGNOSIS — R6881 Early satiety: Secondary | ICD-10-CM | POA: Diagnosis present

## 2022-08-12 DIAGNOSIS — N281 Cyst of kidney, acquired: Secondary | ICD-10-CM | POA: Diagnosis not present

## 2022-08-12 DIAGNOSIS — K8060 Calculus of gallbladder and bile duct with cholecystitis, unspecified, without obstruction: Secondary | ICD-10-CM | POA: Diagnosis not present

## 2022-08-12 DIAGNOSIS — Z7901 Long term (current) use of anticoagulants: Secondary | ICD-10-CM | POA: Diagnosis not present

## 2022-08-12 DIAGNOSIS — K828 Other specified diseases of gallbladder: Secondary | ICD-10-CM | POA: Diagnosis present

## 2022-08-12 DIAGNOSIS — R935 Abnormal findings on diagnostic imaging of other abdominal regions, including retroperitoneum: Secondary | ICD-10-CM | POA: Diagnosis not present

## 2022-08-12 DIAGNOSIS — N179 Acute kidney failure, unspecified: Secondary | ICD-10-CM | POA: Diagnosis not present

## 2022-08-12 DIAGNOSIS — K8050 Calculus of bile duct without cholangitis or cholecystitis without obstruction: Secondary | ICD-10-CM | POA: Diagnosis not present

## 2022-08-12 DIAGNOSIS — I5032 Chronic diastolic (congestive) heart failure: Secondary | ICD-10-CM | POA: Diagnosis present

## 2022-08-12 DIAGNOSIS — Z881 Allergy status to other antibiotic agents status: Secondary | ICD-10-CM | POA: Diagnosis not present

## 2022-08-12 DIAGNOSIS — K8042 Calculus of bile duct with acute cholecystitis without obstruction: Principal | ICD-10-CM

## 2022-08-12 DIAGNOSIS — Z7985 Long-term (current) use of injectable non-insulin antidiabetic drugs: Secondary | ICD-10-CM | POA: Diagnosis not present

## 2022-08-12 DIAGNOSIS — K838 Other specified diseases of biliary tract: Secondary | ICD-10-CM | POA: Diagnosis not present

## 2022-08-12 DIAGNOSIS — R7401 Elevation of levels of liver transaminase levels: Secondary | ICD-10-CM | POA: Diagnosis present

## 2022-08-12 DIAGNOSIS — K5730 Diverticulosis of large intestine without perforation or abscess without bleeding: Secondary | ICD-10-CM | POA: Diagnosis present

## 2022-08-12 DIAGNOSIS — K819 Cholecystitis, unspecified: Secondary | ICD-10-CM | POA: Diagnosis not present

## 2022-08-12 DIAGNOSIS — Z800 Family history of malignant neoplasm of digestive organs: Secondary | ICD-10-CM | POA: Diagnosis not present

## 2022-08-12 DIAGNOSIS — K8067 Calculus of gallbladder and bile duct with acute and chronic cholecystitis with obstruction: Principal | ICD-10-CM | POA: Diagnosis present

## 2022-08-12 DIAGNOSIS — K219 Gastro-esophageal reflux disease without esophagitis: Secondary | ICD-10-CM | POA: Diagnosis present

## 2022-08-12 DIAGNOSIS — R109 Unspecified abdominal pain: Secondary | ICD-10-CM | POA: Diagnosis not present

## 2022-08-12 DIAGNOSIS — Z7984 Long term (current) use of oral hypoglycemic drugs: Secondary | ICD-10-CM | POA: Diagnosis not present

## 2022-08-12 DIAGNOSIS — Z952 Presence of prosthetic heart valve: Secondary | ICD-10-CM | POA: Diagnosis not present

## 2022-08-12 DIAGNOSIS — I110 Hypertensive heart disease with heart failure: Secondary | ICD-10-CM | POA: Diagnosis present

## 2022-08-12 DIAGNOSIS — Z888 Allergy status to other drugs, medicaments and biological substances status: Secondary | ICD-10-CM | POA: Diagnosis not present

## 2022-08-12 DIAGNOSIS — I341 Nonrheumatic mitral (valve) prolapse: Secondary | ICD-10-CM | POA: Diagnosis present

## 2022-08-12 DIAGNOSIS — I10 Essential (primary) hypertension: Secondary | ICD-10-CM | POA: Diagnosis not present

## 2022-08-12 DIAGNOSIS — M109 Gout, unspecified: Secondary | ICD-10-CM | POA: Diagnosis present

## 2022-08-12 DIAGNOSIS — M1A9XX0 Chronic gout, unspecified, without tophus (tophi): Secondary | ICD-10-CM | POA: Diagnosis not present

## 2022-08-12 DIAGNOSIS — E1165 Type 2 diabetes mellitus with hyperglycemia: Secondary | ICD-10-CM | POA: Diagnosis present

## 2022-08-12 DIAGNOSIS — K8020 Calculus of gallbladder without cholecystitis without obstruction: Secondary | ICD-10-CM | POA: Diagnosis not present

## 2022-08-12 DIAGNOSIS — D649 Anemia, unspecified: Secondary | ICD-10-CM | POA: Diagnosis not present

## 2022-08-12 HISTORY — DX: Type 2 diabetes mellitus without complications: E11.9

## 2022-08-12 LAB — COMPREHENSIVE METABOLIC PANEL
ALT: 227 U/L — ABNORMAL HIGH (ref 0–44)
AST: 250 U/L — ABNORMAL HIGH (ref 15–41)
Albumin: 3.7 g/dL (ref 3.5–5.0)
Alkaline Phosphatase: 248 U/L — ABNORMAL HIGH (ref 38–126)
Anion gap: 12 (ref 5–15)
BUN: 23 mg/dL (ref 8–23)
CO2: 25 mmol/L (ref 22–32)
Calcium: 9.1 mg/dL (ref 8.9–10.3)
Chloride: 96 mmol/L — ABNORMAL LOW (ref 98–111)
Creatinine, Ser: 1.27 mg/dL — ABNORMAL HIGH (ref 0.61–1.24)
GFR, Estimated: >60 mL/min (ref >60)
Glucose, Bld: 142 mg/dL — ABNORMAL HIGH (ref 70–99)
Potassium: 4.3 mmol/L (ref 3.5–5.1)
Sodium: 133 mmol/L — ABNORMAL LOW (ref 135–145)
Total Bilirubin: 7.5 mg/dL — ABNORMAL HIGH (ref 0.3–1.2)
Total Protein: 7.3 g/dL (ref 6.5–8.1)

## 2022-08-12 LAB — CBC WITH DIFFERENTIAL/PLATELET
Abs Immature Granulocytes: 0.05 10*3/uL (ref 0.00–0.07)
Basophils Absolute: 0.1 10*3/uL (ref 0.0–0.1)
Basophils Relative: 0 %
Eosinophils Absolute: 0 10*3/uL (ref 0.0–0.5)
Eosinophils Relative: 0 %
HCT: 42.3 % (ref 39.0–52.0)
Hemoglobin: 13.8 g/dL (ref 13.0–17.0)
Immature Granulocytes: 0 %
Lymphocytes Relative: 11 %
Lymphs Abs: 1.5 10*3/uL (ref 0.7–4.0)
MCH: 29.3 pg (ref 26.0–34.0)
MCHC: 32.6 g/dL (ref 30.0–36.0)
MCV: 89.8 fL (ref 80.0–100.0)
Monocytes Absolute: 1.3 10*3/uL — ABNORMAL HIGH (ref 0.1–1.0)
Monocytes Relative: 10 %
Neutro Abs: 9.8 10*3/uL — ABNORMAL HIGH (ref 1.7–7.7)
Neutrophils Relative %: 79 %
Platelets: 292 10*3/uL (ref 150–400)
RBC: 4.71 MIL/uL (ref 4.22–5.81)
RDW: 14.5 % (ref 11.5–15.5)
WBC: 12.7 10*3/uL — ABNORMAL HIGH (ref 4.0–10.5)
nRBC: 0 % (ref 0.0–0.2)

## 2022-08-12 LAB — PROTIME-INR
INR: 3 — ABNORMAL HIGH (ref 0.8–1.2)
Prothrombin Time: 31 seconds — ABNORMAL HIGH (ref 11.4–15.2)

## 2022-08-12 LAB — TYPE AND SCREEN
ABO/RH(D): A POS
Antibody Screen: NEGATIVE

## 2022-08-12 LAB — GLUCOSE, CAPILLARY
Glucose-Capillary: 91 mg/dL (ref 70–99)
Glucose-Capillary: 100 mg/dL — ABNORMAL HIGH (ref 70–99)

## 2022-08-12 LAB — APTT: aPTT: 54 seconds — ABNORMAL HIGH (ref 24–36)

## 2022-08-12 LAB — LIPASE, BLOOD: Lipase: 118 U/L — ABNORMAL HIGH (ref 11–51)

## 2022-08-12 MED ORDER — PROCHLORPERAZINE EDISYLATE 10 MG/2ML IJ SOLN: 5.0000 mg | Freq: Four times a day (QID) | INTRAMUSCULAR | Status: DC | PRN

## 2022-08-12 MED ORDER — LACTATED RINGERS IV BOLUS
1000.0000 mL | Freq: Once | INTRAVENOUS | Status: AC
  Administered 2022-08-12: 1000 mL via INTRAVENOUS

## 2022-08-12 MED ORDER — SODIUM CHLORIDE 0.9 % IV SOLN
3.0000 g | Freq: Once | INTRAVENOUS | Status: AC
  Administered 2022-08-12: 3 g via INTRAVENOUS
  Filled 2022-08-12: qty 8

## 2022-08-12 MED ORDER — POLYETHYLENE GLYCOL 3350 17 G PO PACK: 17.0000 g | PACK | Freq: Every day | ORAL | Status: DC | PRN

## 2022-08-12 MED ORDER — INSULIN ASPART 100 UNIT/ML IJ SOLN
0.0000 [IU] | INTRAMUSCULAR | Status: DC
  Administered 2022-08-13 – 2022-08-15 (×7): 1 [IU] via SUBCUTANEOUS
  Administered 2022-08-15: 3 [IU] via SUBCUTANEOUS
  Administered 2022-08-15 – 2022-08-16 (×5): 1 [IU] via SUBCUTANEOUS
  Administered 2022-08-16: 5 [IU] via SUBCUTANEOUS
  Administered 2022-08-17 (×2): 1 [IU] via SUBCUTANEOUS
  Administered 2022-08-17: 2 [IU] via SUBCUTANEOUS
  Administered 2022-08-18: 1 [IU] via SUBCUTANEOUS
  Filled 2022-08-12: qty 0.09

## 2022-08-12 MED ORDER — GADOBUTROL 1 MMOL/ML IV SOLN
7.5000 mL | Freq: Once | INTRAVENOUS | Status: AC | PRN
  Administered 2022-08-12: 7.5 mL via INTRAVENOUS

## 2022-08-12 MED ORDER — HYDROMORPHONE HCL 1 MG/ML IJ SOLN
0.5000 mg | INTRAMUSCULAR | Status: DC | PRN
  Administered 2022-08-14 – 2022-08-15 (×3): 0.5 mg via INTRAVENOUS
  Filled 2022-08-12 (×3): qty 0.5

## 2022-08-12 MED ORDER — SODIUM CHLORIDE 0.9 % IV SOLN
3.0000 g | Freq: Four times a day (QID) | INTRAVENOUS | Status: DC
  Administered 2022-08-12 – 2022-08-18 (×21): 3 g via INTRAVENOUS
  Filled 2022-08-12 (×21): qty 8

## 2022-08-12 MED ORDER — OXYCODONE HCL 5 MG PO TABS
5.0000 mg | ORAL_TABLET | Freq: Four times a day (QID) | ORAL | Status: DC | PRN
  Administered 2022-08-14 – 2022-08-18 (×2): 5 mg via ORAL
  Filled 2022-08-12 (×2): qty 1

## 2022-08-12 MED ORDER — PANTOPRAZOLE SODIUM 40 MG PO TBEC
40.0000 mg | DELAYED_RELEASE_TABLET | Freq: Every day | ORAL | Status: DC
  Administered 2022-08-12 – 2022-08-18 (×7): 40 mg via ORAL
  Filled 2022-08-12 (×7): qty 1

## 2022-08-12 MED ORDER — ONDANSETRON HCL 4 MG/2ML IJ SOLN
4.0000 mg | Freq: Once | INTRAMUSCULAR | Status: AC
  Administered 2022-08-12: 4 mg via INTRAVENOUS
  Filled 2022-08-12: qty 2

## 2022-08-12 MED ORDER — BUTALBITAL-APAP-CAFFEINE 50-325-40 MG PO TABS
1.0000 | ORAL_TABLET | ORAL | Status: AC | PRN
  Administered 2022-08-12: 1 via ORAL
  Filled 2022-08-12: qty 1

## 2022-08-12 MED ORDER — NEBIVOLOL HCL 2.5 MG PO TABS
2.5000 mg | ORAL_TABLET | Freq: Every day | ORAL | Status: DC
  Administered 2022-08-12 – 2022-08-18 (×7): 2.5 mg via ORAL
  Filled 2022-08-12 (×7): qty 1

## 2022-08-12 MED ORDER — LACTATED RINGERS IV SOLN: INTRAVENOUS | Status: AC

## 2022-08-12 MED ORDER — MELATONIN 5 MG PO TABS
5.0000 mg | ORAL_TABLET | Freq: Every evening | ORAL | Status: DC | PRN
  Administered 2022-08-12: 5 mg via ORAL
  Filled 2022-08-12: qty 1

## 2022-08-12 NOTE — H&P (Signed)
History and Physical  [name], PhD ZOX:[id number] DOB: 08-06-1950 DOA: 08/12/2022  Referring physician: Dr. Eloise Harman, EDP  PCP: [name], MD  Outpatient Specialists: Cardiology. Patient coming from: Home.  Chief Complaint: Right upper quadrant abdominal pain after eating.  HPI: [name], PhD is a 72 y.o. male with medical history significant for mitral valve prolapse status post mitral valve replacement, history of aortic valve replacement, hypertension, type 2 diabetes, hyperlipidemia, GERD, who presented to [facility] ED with complaints of postprandial right upper quadrant abdominal pain.  Symptoms started 6 days ago.  Associated with chills and vomiting x 1 on Saturday night 08/07/2022.  No reported subjective fevers.  Endorses a 6 pound unintentional weight loss in 2 to 3 weeks, thinks it may be related to taking semaglutide.  He went to his primary care provider where labs were obtained, returned with elevated LFTs.  He was referred to the ED for further evaluation.  In the ED, afebrile, vital signs are stable.  Lab studies notable for leukocytosis with WBC 12.7 K.  Elevated liver chemistries, alkaline phosphatase 248, AST 250, ALT 227, total bilirubin 7.5.  Lipase 118.  Right upper quadrant abdominal ultrasound revealed the following findings: Cholelithiasis with moderate gallbladder wall thickening. No pericholecystic fluid seen. In the correct clinical settings this may represent acute cholecystitis   Dilation of the common bile duct to 1.6 cm, concerning for choledocholithiasis or other source of downstream biliary ductal obstruction.  Due to concern for possible intra-abdominal infection the patient received 1 dose of Unasyn 3 g x 1.  Additionally received 1 dose of IV Zofran 4 mg x 1, and 1 L IV fluid bolus LR x 1.  The patient is currently n.p.o. and is not experiencing any pain or nausea at this time.  States symptoms occur after eating.  EDP discussed the case  with GI and general surgery who will see in consultation.  MRCP ordered and is pending.  Last dose of Coumadin was last night 08/11/2022.  Has been n.p.o. since last night.  The patient was admitted at [facility] progressive care unit as inpatient status.    ED Course: Tmax 98.2.  BP 115/89, pulse 90, respiratory 15, saturation 97% on room air.  Lab studies markable for WBC 12.7, neutrophil count 9.8, monocyte count 1.3.  Sodium 133, chloride 96, glucose 142, creatinine 1.27, alkaline phosphatase 248, lipase 118, AST 250, ALT 227, total bilirubin 7.5.  Review of Systems: Review of systems as noted in the HPI. All other systems reviewed and are negative.   Past Medical History:  Diagnosis Date   ALLERGIC RHINITIS    Diabetes mellitus without complication    H/O mitral valve replacement    10 yrs ago   HYPERLIPIDEMIA    Hypertension    Long term current use of anticoagulant    MITRAL VALVE PROLAPSE    PREMATURE VENTRICULAR CONTRACTIONS    Past Surgical History:  Procedure Laterality Date   APPENDECTOMY     COLONOSCOPY N/A 03/29/2014   Procedure: COLONOSCOPY;  Surgeon: Malissa Hippo, MD;  Location: [facility];  Service: Endoscopy;  Laterality: N/A;  855   COLONOSCOPY N/A 06/01/2019   Rehman: ileum normal, diverticulosis in entire colon, external hemorhoids, no specimens   ESOPHAGOGASTRODUODENOSCOPY N/A 03/29/2014   Procedure: ESOPHAGOGASTRODUODENOSCOPY (EGD);  Surgeon: Malissa Hippo, MD;  Location: [facility];  Service: Endoscopy;  Laterality: N/A;   MITRAL VALVE REPLACEMENT     SHOULDER SURGERY  Social History:  reports that he has never smoked. He has never used smokeless tobacco. He reports current alcohol use. He reports that he does not use drugs.   Allergies  Allergen Reactions   Erythromycin    Sulfonamide Derivatives     Family history: None provided.  Prior to Admission medications   Medication Sig Start Date End Date Taking? Authorizing  Provider  allopurinol (ZYLOPRIM) 100 MG tablet Take 100 mg by mouth daily.    [provider]  ezetimibe-simvastatin (VYTORIN) 10-20 MG tablet Take 1 tablet by mouth daily.    [provider]  guaiFENesin (MUCINEX) 600 MG 12 hr tablet Take 600 mg by mouth daily.    [provider]  icosapent Ethyl (VASCEPA) 1 g capsule Take 1 capsule by mouth 2 (two) times daily. 05/02/19   [provider]  lisinopril (PRINIVIL,ZESTRIL) 40 MG tablet Take 40 mg by mouth daily. Reported on 07/14/2015    [provider]  metFORMIN (GLUCOPHAGE) 500 MG tablet Take 500 mg by mouth 2 (two) times daily with a meal.    [provider]  montelukast (SINGULAIR) 10 MG tablet Take 10 mg by mouth at bedtime.    [provider]  nebivolol (BYSTOLIC) 5 MG tablet Take 5 mg by mouth daily.    [provider]  omeprazole (PRILOSEC) 40 MG capsule TAKE 1 CAPSULE(40 MG) BY MOUTH DAILY 06/21/22   [name], [name], NP  Psyllium (METAMUCIL PO) Take by mouth. 3-4 daily    [provider]  Semaglutide (RYBELSUS) 7 MG TABS Take 7 mg by mouth daily.    [provider]  sucralfate (CARAFATE) 1 GM/10ML suspension Take 10 mLs (1 g total) by mouth 4 (four) times daily. 04/08/22   [name], Jeral Pinch, NP  warfarin (COUMADIN) 5 MG tablet TAKE 1 TABLET BY MOUTH EVERY DAY OR AS DIRECTED 01/18/22   [name], MD    Physical Exam: BP 126/82   Pulse 86   Temp 98.2 F (36.8 C) (Oral)   Resp 18   Ht 6\' 1"  (1.854 m)   Wt 83.9 kg   SpO2 98%   BMI 24.41 kg/m   General: 72 y.o. year-old male well developed well nourished in no acute distress.  Alert and oriented x3. Cardiovascular: Regular rate and rhythm with no rubs or gallops.  No thyromegaly or JVD noted.  No lower extremity edema. 2/4 pulses in all 4 extremities. Respiratory: Clear to auscultation with no wheezes or rales. Good inspiratory effort. Abdomen: Soft nontender nondistended with normal bowel  sounds x4 quadrants. Muskuloskeletal: No cyanosis, clubbing or edema noted bilaterally Neuro: CN II-XII intact, strength, sensation, reflexes Skin: No ulcerative lesions noted or rashes Psychiatry: Judgement and insight appear normal. Mood is appropriate for condition and setting          Labs on Admission:  Basic Metabolic Panel: Recent Labs  Lab 08/12/22 1259  NA 133*  K 4.3  CL 96*  CO2 25  GLUCOSE 142*  BUN 23  CREATININE 1.27*  CALCIUM 9.1   Liver Function Tests: Recent Labs  Lab 08/12/22 1259  AST 250*  ALT 227*  ALKPHOS 248*  BILITOT 7.5*  PROT 7.3  ALBUMIN 3.7   Recent Labs  Lab 08/12/22 1259  LIPASE 118*   No results for input(s): "AMMONIA" in the last 168 hours. CBC: Recent Labs  Lab 08/12/22 1259  WBC 12.7*  NEUTROABS 9.8*  HGB 13.8  HCT 42.3  MCV 89.8  PLT 292  Cardiac Enzymes: No results for input(s): "CKTOTAL", "CKMB", "CKMBINDEX", "TROPONINI" in the last 168 hours.  BNP (last 3 results) No results for input(s): "BNP" in the last 8760 hours.  ProBNP (last 3 results) No results for input(s): "PROBNP" in the last 8760 hours.  CBG: No results for input(s): "GLUCAP" in the last 168 hours.  Radiological Exams on Admission: US Abdomen Limited RUQ (LIVER/GB)  Result Date: 08/12/2022 CLINICAL DATA:  Right upper quadrant pain. EXAM: ULTRASOUND ABDOMEN LIMITED RIGHT UPPER QUADRANT COMPARISON:  None Available. FINDINGS: Gallbladder: Layering large calculus measures 2.3 cm seen in the neck of the gallbladder. There is moderate gallbladder wall thickening measuring 6.3 mm. Common bile duct: Diameter: 1.6 cm Liver: No focal lesion identified. Within normal limits in parenchymal echogenicity. Limited visualization of the left lobe. Portal vein is patent on color Doppler imaging with normal direction of blood flow towards the liver. Other: None. IMPRESSION: Cholelithiasis with moderate gallbladder wall thickening. No pericholecystic fluid seen. In the  correct clinical settings this may represent acute cholecystitis Dilation of the common bile duct to 1.6 cm, concerning for choledocholithiasis or other source of downstream biliary ductal obstruction. Electronically Signed   By: Ted Mcalpine M.D.   On: 08/12/2022 13:38    EKG: I independently viewed the EKG done and my findings are as followed: None at the time of this visit.  Assessment/Plan Present on Admission:  Choledocholithiasis  Principal Problem:   Choledocholithiasis  Choledocholithiasis, seen on right upper quadrant abdominal ultrasound. Presents with postprandial abdominal pain of nearly a week duration. MRCP is pending GI and general surgery consulted, appreciate assistance. Currently n.p.o. until seen by general surgery or GI. Last dose of Coumadin 08/11/2022 nightly. As needed analgesics IV fluid hydration LR at 50 cc/h x 2 days  Acute transaminitis, hyperbilirubinemia, in the setting of choledocholithiasis LFTs elevated T. bili 7.5 Repeat CMP and trend LFTs Avoid hepatotoxic agents. Holding off antilipid agents  Leukocytosis, suspect reactive in the setting of choledocholithiasis No right upper quadrant abdominal tenderness on exam. Afebrile, nonseptic appearing Received 1 dose of Unasyn in the ED Repeat CBC, if persistently elevated or in the setting of fever start antibiotics.  Mitral valve prolapse status post mitral valve replacement on Coumadin History of aortic valve replacement. Therapeutic INR 3.0 Coumadin held, heparin drip started. Heparin dosed by pharmacy  Type 2 diabetes with hyperglycemia Serum glucose 142 Hold off home oral hypoglycemics Start insulin sliding scale every 4 hours while NPO.  Unintentional weight loss, possibly side effect from Ozempic Reports 6 pound weight loss in 2 to 3 weeks. Hold off home Ozempic  Mild AKI, likely prerenal in the setting of dehydration from poor oral intake Baseline creatinine appears to be 0.88  with GFR greater than 60 Presented with creatinine 1.27 Avoid dehydration, nephrotoxic agents, or hypotension LR at 50 cc/h x 2 days. Monitor urine output. Closely monitor volume status while on IV fluid.  Hyperlipidemia Hold off home antilipid regimen due to elevated liver chemistries.  GERD Resume home PPI  Hypertension HFpEF Blood pressure is at goal Resume home nebivolol Hold off home lisinopril due to AKI Closely monitor volume status while on IV fluid   DVT prophylaxis: Heparin drip  Code Status: Full code  Family Communication: Updated his wife at bedside.  Disposition Plan: Admitted to progressive care unit  Consults called: General surgery, GI.  Admission status: Inpatient status.   Status is: Inpatient The patient requires at least 2 midnights for further evaluation and treatment of present condition.  Darlin Drop MD [facility] Pager [phone]  If 7PM-7AM, please contact night-coverage [email or web address] Password [facility]  08/12/2022, 4:08 PM

## 2022-08-12 NOTE — ED Triage Notes (Addendum)
Patient vomited Saturday night, all night. Had labs drawn yesterday at his primary care doctor. Was told his lipase, white blood cell count, bilirubin were elevated and that he needs an ERCP. Centralized abdominal pain and lack of appetite.

## 2022-08-12 NOTE — Plan of Care (Signed)
  Problem: Education: Goal: Knowledge of General Education information will improve Description Including pain rating scale, medication(s)/side effects and non-pharmacologic comfort measures Outcome: Progressing   Problem: Health Behavior/Discharge Planning: Goal: Ability to manage health-related needs will improve Outcome: Progressing   

## 2022-08-12 NOTE — Consult Note (Signed)
[name], PhD Jun 03, 1950  [id number].    Requesting MD: Dr. Vonita Moss Chief Complaint/Reason for Consult: gallstones, elevated LFTs  HPI:  This is a 72 yo male with a history of a mechanical heart valve on coumadin (INR 3), DM, HLD, and HTN who saw his PCP and was referred to the ED today secondary to elevated LFTs.  He began having some RUQ abdominal pain along with vomiting on Saturday.  He has had some weight loss of about 6 lbs in the last several weeks.  He has had some decrease oral intake as well as he has had pain episodes after eating.  He saw his pcp due to these symptoms and he was noted to have elevated LFTs and was referred here to the Ed.  He has noted some yellowing of skin and darkened urine. He denies pale stool. Here he has been noted to have a TB of 7.5, AST/ALT 250/227, ALKphos of 248.  He has an [place] that reveals a large stone measuring 2.3cm seen in the neck of the gallbladder with moderate wall thickening of 6.46mm.  He also has a CBD of 1.6cm but further visualization of the duct is not commented on. We have been asked to see for further recommendations.  ROS: ROS: see HPI  History reviewed. No pertinent family history.  Past Medical History:  Diagnosis Date   ALLERGIC RHINITIS    Diabetes mellitus without complication    H/O mitral valve replacement    10 yrs ago   HYPERLIPIDEMIA    Hypertension    Long term current use of anticoagulant    MITRAL VALVE PROLAPSE    PREMATURE VENTRICULAR CONTRACTIONS     Past Surgical History:  Procedure Laterality Date   APPENDECTOMY     COLONOSCOPY N/A 03/29/2014   Procedure: COLONOSCOPY;  Surgeon: Malissa Hippo, MD;  Location: [facility];  Service: Endoscopy;  Laterality: N/A;  855   COLONOSCOPY N/A 06/01/2019   Rehman: ileum normal, diverticulosis in entire colon, external hemorhoids, no specimens   ESOPHAGOGASTRODUODENOSCOPY N/A 03/29/2014   Procedure: ESOPHAGOGASTRODUODENOSCOPY (EGD);  Surgeon:  Malissa Hippo, MD;  Location: [facility];  Service: Endoscopy;  Laterality: N/A;   MITRAL VALVE REPLACEMENT     SHOULDER SURGERY      Social History:  reports that he has never smoked. He has never used smokeless tobacco. He reports current alcohol use. He reports that he does not use drugs.  Allergies:  Allergies  Allergen Reactions   Erythromycin    Sulfonamide Derivatives     (Not in a hospital admission)    Physical Exam: Blood pressure 115/89, pulse 90, temperature 98.2 F (36.8 C), temperature source Oral, resp. rate 15, height  (1.854 m), weight 83.9 kg, SpO2 97 %. General: pleasant, WD, WN white male who is laying in bed in NAD HEENT: head is normocephalic, atraumatic.  Ears and nose without any masses or lesions.  Mouth is pink and moist Heart: regular, rate, and rhythm.  Normal s1,s2. No obvious murmurs, gallops, or rubs noted.  Palpable radial and pedal pulses bilaterally Lungs: CTAB, no wheezes, rhonchi, or rales noted.  Respiratory effort nonlabored Abd: soft, mild TTP in RUQ, ND, +BS, no masses, hernias, or organomegaly MS: all 4 extremities are symmetrical with no cyanosis, clubbing, or edema. Skin: warm and dry with no masses, lesions, or rashes, mild jaundice Psych: A&Ox3 with an appropriate affect.   Results for orders placed or performed during the hospital  encounter of 08/12/22 (from the past 48 hour(s))  Comprehensive metabolic panel     Status: Abnormal   Collection Time: 08/12/22 12:59 PM  Result Value Ref Range   Sodium 133 (L) 135 - 145 mmol/L   Potassium 4.3 3.5 - 5.1 mmol/L   Chloride 96 (L) 98 - 111 mmol/L   CO2 25 22 - 32 mmol/L   Glucose, Bld 142 (H) 70 - 99 mg/dL    Comment: Glucose reference range applies only to samples taken after fasting for at least 8 hours.   BUN 23 8 - 23 mg/dL   Creatinine, Ser 1.61 (H) 0.61 - 1.24 mg/dL   Calcium 9.1 8.9 - 09.6 mg/dL   Total Protein 7.3 6.5 - 8.1 g/dL   Albumin 3.7 3.5 - 5.0 g/dL   AST  045 (H) 15 - 41 U/L   ALT 227 (H) 0 - 44 U/L   Alkaline Phosphatase 248 (H) 38 - 126 U/L   Total Bilirubin 7.5 (H) 0.3 - 1.2 mg/dL   GFR, Estimated >40 >98 mL/min    Comment: (NOTE) Calculated using the CKD-EPI Creatinine Equation (2021)    Anion gap 12 5 - 15    Comment: Performed at [facility], [address] [place], [place] [address]  Lipase, blood     Status: Abnormal   Collection Time: 08/12/22 12:59 PM  Result Value Ref Range   Lipase 118 (H) 11 - 51 U/L    Comment: Performed at [facility], [address], [place], [place] [address]  CBC with Diff     Status: Abnormal   Collection Time: 08/12/22 12:59 PM  Result Value Ref Range   WBC 12.7 (H) 4.0 - 10.5 K/uL   RBC 4.71 4.22 - 5.81 MIL/uL   Hemoglobin 13.8 13.0 - 17.0 g/dL   HCT 62.1 30.8 - 65.7 %   MCV 89.8 80.0 - 100.0 fL   MCH 29.3 26.0 - 34.0 pg   MCHC 32.6 30.0 - 36.0 g/dL   RDW 84.6 96.2 - 95.2 %   Platelets 292 150 - 400 K/uL   nRBC 0.0 0.0 - 0.2 %   Neutrophils Relative % 79 %   Neutro Abs 9.8 (H) 1.7 - 7.7 K/uL   Lymphocytes Relative 11 %   Lymphs Abs 1.5 0.7 - 4.0 K/uL   Monocytes Relative 10 %   Monocytes Absolute 1.3 (H) 0.1 - 1.0 K/uL   Eosinophils Relative 0 %   Eosinophils Absolute 0.0 0.0 - 0.5 K/uL   Basophils Relative 0 %   Basophils Absolute 0.1 0.0 - 0.1 K/uL   Immature Granulocytes 0 %   Abs Immature Granulocytes 0.05 0.00 - 0.07 K/uL    Comment: Performed at [facility], [address] [place], [place] [address]  Protime-INR     Status: Abnormal   Collection Time: 08/12/22  2:37 PM  Result Value Ref Range   Prothrombin Time 31.0 (H) 11.4 - 15.2 seconds   INR 3.0 (H) 0.8 - 1.2    Comment: (NOTE) INR goal varies based on device and disease states. Performed at [facility], [address] [place], [place] [address]   APTT     Status: Abnormal   Collection Time: 08/12/22  2:37 PM  Result Value Ref Range    aPTT 54 (H) 24 - 36 seconds    Comment:        IF BASELINE aPTT IS ELEVATED, SUGGEST PATIENT RISK ASSESSMENT BE  USED TO DETERMINE APPROPRIATE ANTICOAGULANT THERAPY. Performed at [facility], [address], [place], [place] [address]   Type and screen [facility]     Status: None (Preliminary result)   Collection Time: 08/12/22  2:37 PM  Result Value Ref Range   ABO/RH(D) PENDING    Antibody Screen PENDING    Sample Expiration      08/15/2022,2359 Performed at [facility], [address] [place], [place] [address]    US Abdomen Limited RUQ (LIVER/GB)  Result Date: 08/12/2022 CLINICAL DATA:  Right upper quadrant pain. EXAM: ULTRASOUND ABDOMEN LIMITED RIGHT UPPER QUADRANT COMPARISON:  None Available. FINDINGS: Gallbladder: Layering large calculus measures 2.3 cm seen in the neck of the gallbladder. There is moderate gallbladder wall thickening measuring 6.3 mm. Common bile duct: Diameter: 1.6 cm Liver: No focal lesion identified. Within normal limits in parenchymal echogenicity. Limited visualization of the left lobe. Portal vein is patent on color Doppler imaging with normal direction of blood flow towards the liver. Other: None. IMPRESSION: Cholelithiasis with moderate gallbladder wall thickening. No pericholecystic fluid seen. In the correct clinical settings this may represent acute cholecystitis Dilation of the common bile duct to 1.6 cm, concerning for choledocholithiasis or other source of downstream biliary ductal obstruction. Electronically Signed   By: Ted Mcalpine M.D.   On: 08/12/2022 13:38      Assessment/Plan Possible cholecystitis, gallstones, elevated LFTs, dilated CBD The patient has been seen, examined, chart, labs, vitals, and imaging personally reviewed.  He appears that he has gallbladder wall thickening with a 2.3cm stone in the neck of his gallbladder with a slight elevation in his WBC at 12.7.  However,  he has significantly elevated LFTs with a TB of 7.5 and a CBD of 1.6cm in width.  He needs further imaging to evaluate his duct prior to any type of surgical intervention to determine if this is secondary to a stone vs some other obstructive process.  MRCP would likely be the best imaging modality for this.  He can be started on abx therapy for now.  He should likely remain NPO at this time for this procedure.  Agree with medical admission and GI consult given dilated duct and elevated TB.  We will follow the patient and plan for operative intervention when indicated.   FEN - NPO/IVFs VTE - hold coumadin, no need for immediate reversal at this time, but may need pending MRCP results ID - per medicine.  Rocephin likely ok for now Admit - per medicine  HTN HLD Mechanic valve - on coumadin, INR 3, coumadin held DM  I reviewed nursing notes, ED provider notes, last 24 h vitals and pain scores, last 48 h intake and output, last 24 h labs and trends, and last 24 h imaging results.  Carl Best, Endo [facility] 08/12/2022, 3:19 PM Please see Amion for pager number during day hours 7:00am-4:30pm or 7:00am -11:30am on weekends

## 2022-08-12 NOTE — ED Provider Notes (Signed)
EMERGENCY DEPARTMENT AT [facility] Provider Note   CSN: [id number] Arrival date & time: 08/12/22  1216     History  Chief Complaint  Patient presents with   Abdominal Pain    Luis Natal, PhD is a 72 y.o. male.  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  Since Saturday has had intermittent vomiting and postprandial right upper quadrant pain.  Has had decreased p.o. intake.  No abdominal surgeries aside from appendectomy.  Denies any fever, diarrhea, or constipation.  Noticed changes to his eye color today.  Says he has been n.p.o. since last night.        Home Medications Prior to Admission medications   Medication Sig Start Date End Date Taking? Authorizing Provider  AFRIN NASAL SPRAY 0.05 % nasal spray Place 1 spray into both nostrils 2 (two) times daily as needed for congestion.   Yes [provider]  allopurinol (ZYLOPRIM) 300 MG tablet Take 300 mg by mouth daily.   Yes [provider]  amoxicillin (AMOXIL) 500 MG capsule Take 2,000 mg by mouth See admin instructions. Take 2,000 mg by mouth one hour prior to dental procedures   Yes [provider]  colchicine 0.6 MG tablet Take 0.6 mg by mouth See admin instructions. Take 0.6 mg by mouth once a day as directed for gout flares   Yes [provider]  guaiFENesin (MUCINEX) 600 MG 12 hr tablet Take 600 mg by mouth in the morning.   Yes [provider]  icosapent Ethyl (VASCEPA) 1 g capsule Take 2 g by mouth 2 (two) times daily. 05/02/19  Yes [provider]  lisinopril (PRINIVIL,ZESTRIL) 40 MG tablet Take 40 mg by mouth daily. Reported on 07/14/2015   Yes [provider]  metFORMIN (GLUCOPHAGE) 500 MG tablet Take 500 mg by mouth in the morning and at bedtime.   Yes [provider]  montelukast (SINGULAIR) 10 MG tablet Take 10 mg  by mouth in the morning.   Yes [provider]  nebivolol (BYSTOLIC) 5 MG tablet Take 5 mg by mouth daily.   Yes [provider]  NEXLETOL 180 MG TABS Take 180 mg by mouth daily.   Yes [provider]  omeprazole (PRILOSEC) 40 MG capsule TAKE 1 CAPSULE(40 MG) BY MOUTH DAILY Patient taking differently: Take 40 mg by mouth daily before breakfast. 06/21/22  Yes [name], [name], NP  OVER THE COUNTER MEDICATION Take 1-2 tablets by mouth See admin instructions. TYLENOL Sinus + Headache Non-Drowsy Daytime Caplets for Nasal Congestion, Sinus Pressure & Pain Relief- Take 1-2 caplets by mouth every six hours as needed for migraines   Yes [provider]  Psyllium (METAMUCIL PO) Take 4 capsules by mouth daily with breakfast.   Yes [provider]  warfarin (COUMADIN) 5 MG tablet TAKE 1 TABLET BY MOUTH EVERY DAY OR AS DIRECTED Patient taking differently: Take 5 mg by mouth at bedtime. 01/18/22  Yes Luis Stade, MD  RYBELSUS 14 MG TABS Take 14 mg by mouth daily. Patient not taking: Reported on 08/12/2022    [provider]  sucralfate (CARAFATE) 1 GM/10ML suspension Take 10 mLs (1 g total) by mouth 4 (four) times daily. Patient not taking: Reported on 08/12/2022 04/08/22   Luis James, NP      Allergies    Erythromycin, Sulfamethoxazole, and Vytorin [ezetimibe-simvastatin]    Review of Systems  Review of Systems  Physical Exam Updated Vital Signs BP (!) 140/88 (BP Location: Right Arm)   Pulse 91   Temp 98.3 F (36.8 C) (Oral)   Resp 18   Ht 6\' 1"  (1.854 m)   Wt 82.7 kg   SpO2 96%   BMI 24.05 kg/m  Physical Exam Vitals and nursing note reviewed.  Constitutional:      General: He is not in acute distress.    Appearance: He is well-developed.  HENT:     Head: Normocephalic and atraumatic.     Right Ear: External ear normal.     Left Ear: External ear normal.     Nose: Nose normal.  Eyes:     General: Scleral icterus present.      Extraocular Movements: Extraocular movements intact.     Conjunctiva/sclera: Conjunctivae normal.     Pupils: Pupils are equal, round, and reactive to light.  Cardiovascular:     Rate and Rhythm: Normal rate and regular rhythm.     Heart sounds: Normal heart sounds.  Pulmonary:     Effort: Pulmonary effort is normal. No respiratory distress.     Breath sounds: Normal breath sounds.  Abdominal:     General: There is no distension.     Palpations: Abdomen is soft. There is no mass.     Tenderness: There is no abdominal tenderness. There is no guarding.  Musculoskeletal:     Cervical back: Normal range of motion and neck supple.     Right lower leg: No edema.     Left lower leg: No edema.  Skin:    General: Skin is warm and dry.  Neurological:     Mental Status: He is alert. Mental status is at baseline.  Psychiatric:        Mood and Affect: Mood normal.        Behavior: Behavior normal.     ED Results / Procedures / Treatments   Labs (all labs ordered are listed, but only abnormal results are displayed) Labs Reviewed  COMPREHENSIVE METABOLIC PANEL - Abnormal; Notable for the following components:      Result Value   Sodium 133 (*)    Chloride 96 (*)    Glucose, Bld 142 (*)    Creatinine, Ser 1.27 (*)    AST 250 (*)    ALT 227 (*)    Alkaline Phosphatase 248 (*)    Total Bilirubin 7.5 (*)    All other components within normal limits  LIPASE, BLOOD - Abnormal; Notable for the following components:   Lipase 118 (*)    All other components within normal limits  CBC WITH DIFFERENTIAL/PLATELET - Abnormal; Notable for the following components:   WBC 12.7 (*)    Neutro Abs 9.8 (*)    Monocytes Absolute 1.3 (*)    All other components within normal limits  PROTIME-INR - Abnormal; Notable for the following components:   Prothrombin Time 31.0 (*)    INR 3.0 (*)    All other components within normal limits  APTT - Abnormal; Notable for the following components:   aPTT 54  (*)    All other components within normal limits  HEMOGLOBIN A1C - Abnormal; Notable for the following components:   Hgb A1c MFr Bld 5.7 (*)    All other components within normal limits  COMPREHENSIVE METABOLIC PANEL - Abnormal; Notable for the following components:   Glucose, Bld 112 (*)    BUN 25 (*)    Albumin 3.2 (*)  AST 161 (*)    ALT 177 (*)    Alkaline Phosphatase 220 (*)    Total Bilirubin 4.7 (*)    All other components within normal limits  PROTIME-INR - Abnormal; Notable for the following components:   Prothrombin Time 30.0 (*)    INR 2.9 (*)    All other components within normal limits  LIPASE, BLOOD - Abnormal; Notable for the following components:   Lipase 154 (*)    All other components within normal limits  GLUCOSE, CAPILLARY - Abnormal; Notable for the following components:   Glucose-Capillary 100 (*)    All other components within normal limits  GLUCOSE, CAPILLARY - Abnormal; Notable for the following components:   Glucose-Capillary 120 (*)    All other components within normal limits  GLUCOSE, CAPILLARY - Abnormal; Notable for the following components:   Glucose-Capillary 103 (*)    All other components within normal limits  GLUCOSE, CAPILLARY - Abnormal; Notable for the following components:   Glucose-Capillary 121 (*)    All other components within normal limits  GLUCOSE, CAPILLARY - Abnormal; Notable for the following components:   Glucose-Capillary 113 (*)    All other components within normal limits  GLUCOSE, CAPILLARY - Abnormal; Notable for the following components:   Glucose-Capillary 129 (*)    All other components within normal limits  GLUCOSE, CAPILLARY  CBC  MAGNESIUM  PHOSPHORUS  TYPE AND SCREEN    EKG None  Radiology MR ABDOMEN MRCP W WO CONTAST  Result Date: 08/12/2022 CLINICAL DATA:  Jaundice, cholelithiasis, biliary ductal dilatation by ultrasound EXAM: MRI ABDOMEN WITHOUT AND WITH CONTRAST (INCLUDING MRCP) TECHNIQUE:  Multiplanar multisequence MR imaging of the abdomen was performed both before and after the administration of intravenous contrast. Heavily T2-weighted images of the biliary and pancreatic ducts were obtained, and three-dimensional MRCP images were rendered by post processing. CONTRAST:  7.66mL GADAVIST GADOBUTROL 1 MMOL/ML IV SOLN COMPARISON:  Same-day right upper quadrant ultrasound FINDINGS: Lower chest: No acute abnormality. Elevation of the right hemidiaphragm. Hepatobiliary: No solid liver abnormality is seen. Distended gallbladder containing multiple gallstones. Gallbladder wall thickening or pericholecystic fluid. Intra and extrahepatic biliary ductal dilatation, the common bile duct measuring up to 1.7 cm in caliber, with numerous calculi stacked within the common bile duct, at least 6, measuring up to 1.1 cm in caliber (series 3, image 15). Pancreas: Unremarkable. No pancreatic ductal dilatation or surrounding inflammatory changes. Spleen: Normal in size without significant abnormality. Adrenals/Urinary Tract: Adrenal glands are unremarkable. Simple, benign right renal cortical cysts, for which no further follow-up or characterization is required. Kidneys are otherwise normal, without renal calculi, solid lesion, or hydronephrosis. Stomach/Bowel: Stomach is within normal limits. No evidence of bowel wall thickening, distention, or inflammatory changes. Vascular/Lymphatic: No significant vascular findings are present. No enlarged abdominal lymph nodes. Other: No abdominal wall hernia or abnormality. No ascites. Musculoskeletal: No acute or significant osseous findings. IMPRESSION: 1. Distended gallbladder containing multiple gallstones. No gallbladder wall thickening or pericholecystic fluid. 2. Intra and extrahepatic biliary ductal dilatation, the common bile duct measuring up to 1.7 cm in caliber, with numerous calculi stacked within the common bile duct, at least 6, measuring up to 1.1 cm in caliber.  These results will be called to the ordering clinician or representative by the Radiologist Assistant, and communication documented in the PACS or [employer]. Electronically Signed   By: Jearld Lesch M.D.   On: 08/12/2022 21:32   MR 3D Recon At Scanner  Result Date: 08/12/2022 CLINICAL DATA:  Jaundice, cholelithiasis, biliary ductal dilatation by ultrasound EXAM: MRI ABDOMEN WITHOUT AND WITH CONTRAST (INCLUDING MRCP) TECHNIQUE: Multiplanar multisequence MR imaging of the abdomen was performed both before and after the administration of intravenous contrast. Heavily T2-weighted images of the biliary and pancreatic ducts were obtained, and three-dimensional MRCP images were rendered by post processing. CONTRAST:  7.17mL GADAVIST GADOBUTROL 1 MMOL/ML IV SOLN COMPARISON:  Same-day right upper quadrant ultrasound FINDINGS: Lower chest: No acute abnormality. Elevation of the right hemidiaphragm. Hepatobiliary: No solid liver abnormality is seen. Distended gallbladder containing multiple gallstones. Gallbladder wall thickening or pericholecystic fluid. Intra and extrahepatic biliary ductal dilatation, the common bile duct measuring up to 1.7 cm in caliber, with numerous calculi stacked within the common bile duct, at least 6, measuring up to 1.1 cm in caliber (series 3, image 15). Pancreas: Unremarkable. No pancreatic ductal dilatation or surrounding inflammatory changes. Spleen: Normal in size without significant abnormality. Adrenals/Urinary Tract: Adrenal glands are unremarkable. Simple, benign right renal cortical cysts, for which no further follow-up or characterization is required. Kidneys are otherwise normal, without renal calculi, solid lesion, or hydronephrosis. Stomach/Bowel: Stomach is within normal limits. No evidence of bowel wall thickening, distention, or inflammatory changes. Vascular/Lymphatic: No significant vascular findings are present. No enlarged abdominal lymph nodes. Other: No abdominal  wall hernia or abnormality. No ascites. Musculoskeletal: No acute or significant osseous findings. IMPRESSION: 1. Distended gallbladder containing multiple gallstones. No gallbladder wall thickening or pericholecystic fluid. 2. Intra and extrahepatic biliary ductal dilatation, the common bile duct measuring up to 1.7 cm in caliber, with numerous calculi stacked within the common bile duct, at least 6, measuring up to 1.1 cm in caliber. These results will be called to the ordering clinician or representative by the Radiologist Assistant, and communication documented in the PACS or [employer]. Electronically Signed   By: Jearld Lesch M.D.   On: 08/12/2022 21:32   US Abdomen Limited RUQ (LIVER/GB)  Result Date: 08/12/2022 CLINICAL DATA:  Right upper quadrant pain. EXAM: ULTRASOUND ABDOMEN LIMITED RIGHT UPPER QUADRANT COMPARISON:  None Available. FINDINGS: Gallbladder: Layering large calculus measures 2.3 cm seen in the neck of the gallbladder. There is moderate gallbladder wall thickening measuring 6.3 mm. Common bile duct: Diameter: 1.6 cm Liver: No focal lesion identified. Within normal limits in parenchymal echogenicity. Limited visualization of the left lobe. Portal vein is patent on color Doppler imaging with normal direction of blood flow towards the liver. Other: None. IMPRESSION: Cholelithiasis with moderate gallbladder wall thickening. No pericholecystic fluid seen. In the correct clinical settings this may represent acute cholecystitis Dilation of the common bile duct to 1.6 cm, concerning for choledocholithiasis or other source of downstream biliary ductal obstruction. Electronically Signed   By: Ted Mcalpine M.D.   On: 08/12/2022 13:38    Procedures Procedures   Medications Ordered in ED Medications  lactated ringers infusion ( Intravenous New Bag/Given 08/13/22 1625)  oxyCODONE (Oxy IR/ROXICODONE) immediate release tablet 5 mg (has no administration in time range)  HYDROmorphone  (DILAUDID) injection 0.5 mg (has no administration in time range)  polyethylene glycol (MIRALAX / GLYCOLAX) packet 17 g (has no administration in time range)  insulin aspart (novoLOG) injection 0-9 Units ( Subcutaneous Not Given 08/13/22 1217)  nebivolol (BYSTOLIC) tablet 2.5 mg (2.5 mg Oral Given 08/13/22 0852)  pantoprazole (PROTONIX) EC tablet 40 mg (40 mg Oral Given 08/13/22 0852)  prochlorperazine (COMPAZINE) injection 5 mg (has no administration in time range)  melatonin tablet 5 mg (5 mg Oral Given 08/12/22 2114)  Ampicillin-Sulbactam (UNASYN) 3 g in sodium chloride 0.9 % 100 mL IVPB (3 g Intravenous New Bag/Given 08/13/22 1417)  lisinopril (ZESTRIL) tablet 40 mg (40 mg Oral Given 08/13/22 1500)  ondansetron (ZOFRAN) injection 4 mg (4 mg Intravenous Given 08/12/22 1257)  lactated ringers bolus 1,000 mL (0 mLs Intravenous Stopped 08/12/22 1706)  Ampicillin-Sulbactam (UNASYN) 3 g in sodium chloride 0.9 % 100 mL IVPB (0 g Intravenous Stopped 08/12/22 1506)  gadobutrol (GADAVIST) 1 MMOL/ML injection 7.5 mL (7.5 mLs Intravenous Contrast Given 08/12/22 1833)  butalbital-acetaminophen-caffeine (FIORICET) 50-325-40 MG per tablet 1 tablet (1 tablet Oral Given 08/12/22 2114)    ED Course/ Medical Decision Making/ A&P Clinical Course as of 08/13/22 1650  Thu Aug 12, 2022  1451 Stevphen Rochester from general surgery is aware. Recommends reaching out to GI after the CT.  [RP]  1520 Dr Margo Aye from medicine to admit the patient. [RP]  1537 Dr Marca Ancona from GI is aware.  Recommends MRCP.  Feels that the patient likely has a gallstone that is so large that is blocking the common bile duct. [RP]    Clinical Course User Index [RP] Rondel Baton, MD                             Medical Decision Making Amount and/or Complexity of Data Reviewed Labs: ordered. Radiology: ordered.  Risk Prescription drug management. Decision regarding hospitalization.   Luis Natal, PhD is a 72 y.o. male with  comorbidities that complicate the patient evaluation including mechanical valve on Coumadin who presents emergency department with right upper quadrant pain and abnormal LFTs  Initial Ddx:  Choledocholithiasis, cholecystitis, hepatitis, pancreatic mass  MDM:  Concerned about cholecystitis or choledocholithiasis given the patient's symptoms.  With his age could also have a pancreatic head mass so we will consider additional imaging after his right upper quadrant ultrasound.  No risk factors for hepatitis.  Plan:  Labs Lipase Right upper quadrant ultrasound  ED Summary/Re-evaluation:  Patient underwent the above workup and did have mildly elevated AST and ALT and total bilirubin of 7.5.  Right upper quadrant ultrasound showed a stone in the neck of the gallbladder with common bile duct dilation.  Discussed with surgery who recommended CT and GI consult.  Discussed with GI who recommended MRCP which is pending at this time which will also evaluate for any pancreatic head masses.  Patient was started on antibiotics and then admitted to medicine for further management.  This patient presents to the ED for concern of complaints listed in HPI, this involves an extensive number of treatment options, and is a complaint that carries with it a high risk of complications and morbidity. Disposition including potential need for admission considered.   Dispo: Admit to Floor  Records reviewed Outpatient Clinic Notes The following labs were independently interpreted: Chemistry and show  elevated LFTs concerning for choledocholithiasis I independently reviewed the following imaging with scope of interpretation limited to determining acute life threatening conditions related to emergency care:  Right upper quadrant ultrasound  and agree with the radiologist interpretation with the following exceptions: None I personally reviewed and interpreted the pt's EKG: see above for interpretation  I have reviewed the  patients home medications and made adjustments as needed Consults: Gastroenterology, General Surgery, and Hospitalist Social Determinants of health:  Elderly   Final Clinical Impression(s) / ED Diagnoses Final diagnoses:  Choledocholithiasis with acute cholecystitis  Right upper quadrant abdominal pain  Nausea and vomiting,  unspecified vomiting type  Anticoagulated    Rx / DC Orders ED Discharge Orders     None         Rondel Baton, MD 08/13/22 1650

## 2022-08-12 NOTE — Progress Notes (Signed)
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)  Allergies  Allergen Reactions   Erythromycin Other (See Comments)    GI Intolerance   Sulfamethoxazole Other (See Comments)    GI Intolerance   Vytorin [Ezetimibe-Simvastatin] Other (See Comments)    LEG CRAMPS    Patient Measurements: Height:  (185.4 cm) Weight: 83.9 kg (185 lb) IBW/kg (Calculated) : 79.9 Heparin Dosing Weight: total body weight  Vital Signs: Temp: 98.6 F (37 C) (04/25 1637) Temp Source: Oral (04/25 1637) BP: 134/86 (04/25 1637) Pulse Rate: 88 (04/25 1637)  Labs: Recent Labs    08/12/22 1259 08/12/22 1437  HGB 13.8  --   HCT 42.3  --   PLT 292  --   APTT  --  54*  LABPROT  --  31.0*  INR  --  3.0*  CREATININE 1.27*  --     Estimated Creatinine Clearance: 59.4 mL/min (A) (by C-G formula based on SCr of 1.27 mg/dL (H)).   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 [place]: Cholelithiasis with moderate gallbladder wall thickening. No pericholecystic fluid seen. In the correct clinical settings this may represent acute cholecystitis. Dilation of the common bile duct to 1.6 cm, concerning for choledocholithiasis or other source of downstream biliary ductal obstruction.  Warfarin held on admission in case surgical intervention required. Pharmacy consulted for heparin dosing. INR = 3. Hgb 13.8, Plt 292.    Goal of  Therapy:  Heparin level 0.3-0.7 units/ml Monitor platelets by anticoagulation protocol: Yes   Plan:  Start IV heparin when INR < 2.5 per MD. Daily PT/INR ordered   Greer Pickerel, PharmD, BCPS Clinical Pharmacist 08/12/2022,7:14 PM

## 2022-08-12 NOTE — ED Notes (Signed)
ED TO INPATIENT HANDOFF REPORT  Name/Age/Gender Luis Natal, PhD 72 y.o. male  Code Status Code Status History     Date Active Date Inactive Code Status Order ID Comments User Context   05/31/2019 1136 06/03/2019 1611 Full Code [id number]  Erick Blinks, DO ED      Advance Directive Documentation    Flowsheet Row Most Recent Value  Type of Advance Directive Healthcare Power of Attorney, Living will  Pre-existing out of facility DNR order (yellow form or pink MOST form) --  "MOST" Form in Place? --       Home/SNF/Other Home  Chief Complaint Choledocholithiasis [K80.50]  Level of Care/Admitting Diagnosis ED Disposition     ED Disposition  Admit   Condition  --   Comment  Hospital Area: [facility] [[id number]]  Level of Care: Progressive [102]  Admit to Progressive based on following criteria: GI, ENDOCRINE disease patients with GI bleeding, acute liver failure or pancreatitis, stable with diabetic ketoacidosis or thyrotoxicosis (hypothyroid) state.  May admit patient to [facility] or [facility] if equivalent level of care is available:: Yes  Covid Evaluation: Asymptomatic - no recent exposure (last 10 days) testing not required  Diagnosis: Choledocholithiasis [409811]  Admitting Physician: Darlin Drop [[id number]]  Attending Physician: Darlin Drop [[id number]]  Certification:: I certify this patient will need inpatient services for at least 2 midnights  Estimated Length of Stay: 2          Medical History Past Medical History:  Diagnosis Date   ALLERGIC RHINITIS    Diabetes mellitus without complication    H/O mitral valve replacement    10 yrs ago   HYPERLIPIDEMIA    Hypertension    Long term current use of anticoagulant    MITRAL VALVE PROLAPSE    PREMATURE VENTRICULAR CONTRACTIONS     Allergies Allergies  Allergen Reactions   Erythromycin    Sulfonamide Derivatives     IV Location/Drains/Wounds Patient  Lines/Drains/Airways Status     Active Line/Drains/Airways     Name Placement date Placement time Site Days   Peripheral IV 08/12/22 20 G Anterior;Proximal;Right Forearm 08/12/22  1256  Forearm  less than 1            Labs/Imaging Results for orders placed or performed during the hospital encounter of 08/12/22 (from the past 48 hour(s))  Comprehensive metabolic panel     Status: Abnormal   Collection Time: 08/12/22 12:59 PM  Result Value Ref Range   Sodium 133 (L) 135 - 145 mmol/L   Potassium 4.3 3.5 - 5.1 mmol/L   Chloride 96 (L) 98 - 111 mmol/L   CO2 25 22 - 32 mmol/L   Glucose, Bld 142 (H) 70 - 99 mg/dL    Comment: Glucose reference range applies only to samples taken after fasting for at least 8 hours.   BUN 23 8 - 23 mg/dL   Creatinine, Ser 6.57 (H) 0.61 - 1.24 mg/dL   Calcium 9.1 8.9 - 84.6 mg/dL   Total Protein 7.3 6.5 - 8.1 g/dL   Albumin 3.7 3.5 - 5.0 g/dL   AST 962 (H) 15 - 41 U/L   ALT 227 (H) 0 - 44 U/L   Alkaline Phosphatase 248 (H) 38 - 126 U/L   Total Bilirubin 7.5 (H) 0.3 - 1.2 mg/dL   GFR, Estimated >95 >28 mL/min    Comment: (NOTE) Calculated using the CKD-EPI Creatinine Equation (2021)    Anion gap 12 5 - 15  Comment: Performed at [facility], [address] [place], [place] [address]  Lipase, blood     Status: Abnormal   Collection Time: 08/12/22 12:59 PM  Result Value Ref Range   Lipase 118 (H) 11 - 51 U/L    Comment: Performed at [facility], [address] [place], [place] [address]  CBC with Diff     Status: Abnormal   Collection Time: 08/12/22 12:59 PM  Result Value Ref Range   WBC 12.7 (H) 4.0 - 10.5 K/uL   RBC 4.71 4.22 - 5.81 MIL/uL   Hemoglobin 13.8 13.0 - 17.0 g/dL   HCT 82.9 56.2 - 13.0 %   MCV 89.8 80.0 - 100.0 fL   MCH 29.3 26.0 - 34.0 pg   MCHC 32.6 30.0 - 36.0 g/dL   RDW 86.5 78.4 - 69.6 %   Platelets 292 150 - 400 K/uL   nRBC 0.0 0.0 - 0.2 %   Neutrophils Relative % 79 %   Neutro  Abs 9.8 (H) 1.7 - 7.7 K/uL   Lymphocytes Relative 11 %   Lymphs Abs 1.5 0.7 - 4.0 K/uL   Monocytes Relative 10 %   Monocytes Absolute 1.3 (H) 0.1 - 1.0 K/uL   Eosinophils Relative 0 %   Eosinophils Absolute 0.0 0.0 - 0.5 K/uL   Basophils Relative 0 %   Basophils Absolute 0.1 0.0 - 0.1 K/uL   Immature Granulocytes 0 %   Abs Immature Granulocytes 0.05 0.00 - 0.07 K/uL    Comment: Performed at [facility], [address] [place], [place] [address]  Protime-INR     Status: Abnormal   Collection Time: 08/12/22  2:37 PM  Result Value Ref Range   Prothrombin Time 31.0 (H) 11.4 - 15.2 seconds   INR 3.0 (H) 0.8 - 1.2    Comment: (NOTE) INR goal varies based on device and disease states. Performed at [facility], [address], [place], [place] [address]   APTT     Status: Abnormal   Collection Time: 08/12/22  2:37 PM  Result Value Ref Range   aPTT 54 (H) 24 - 36 seconds    Comment:        IF BASELINE aPTT IS ELEVATED, SUGGEST PATIENT RISK ASSESSMENT BE USED TO DETERMINE APPROPRIATE ANTICOAGULANT THERAPY. Performed at [facility], [address], [place], [place] [address]   Type and screen [facility]     Status: None (Preliminary result)   Collection Time: 08/12/22  2:37 PM  Result Value Ref Range   ABO/RH(D) PENDING    Antibody Screen PENDING    Sample Expiration      08/15/2022,2359 Performed at [facility], [address] [place], [place] [address]    US Abdomen Limited RUQ (LIVER/GB)  Result Date: 08/12/2022 CLINICAL DATA:  Right upper quadrant pain. EXAM: ULTRASOUND ABDOMEN LIMITED RIGHT UPPER QUADRANT COMPARISON:  None Available. FINDINGS: Gallbladder: Layering large calculus measures 2.3 cm seen in the neck of the gallbladder. There is moderate gallbladder wall thickening measuring 6.3 mm. Common bile duct: Diameter: 1.6 cm Liver: No focal lesion identified. Within normal  limits in parenchymal echogenicity. Limited visualization of the left lobe. Portal vein is patent on color Doppler imaging with normal direction of blood flow towards the liver. Other: None. IMPRESSION: Cholelithiasis with moderate gallbladder wall thickening. No pericholecystic fluid seen. In the correct clinical settings this may represent acute cholecystitis Dilation of the common bile  duct to 1.6 cm, concerning for choledocholithiasis or other source of downstream biliary ductal obstruction. Electronically Signed   By: Ted Mcalpine M.D.   On: 08/12/2022 13:38    Pending Labs Unresulted Labs (From admission, onward)    None       Vitals/Pain Today's Vitals   08/12/22 1222 08/12/22 1223 08/12/22 1543  BP: 115/89  126/82  Pulse: 90  86  Resp: 15  18  Temp: 98.2 F (36.8 C)    TempSrc: Oral    SpO2: 97%  98%  Weight:  83.9 kg   Height:   (1.854 m)   PainSc:  8      Isolation Precautions No active isolations  Medications Medications  ondansetron (ZOFRAN) injection 4 mg (4 mg Intravenous Given 08/12/22 1257)  lactated ringers bolus 1,000 mL (1,000 mLs Intravenous New Bag/Given 08/12/22 1513)  Ampicillin-Sulbactam (UNASYN) 3 g in sodium chloride 0.9 % 100 mL IVPB (0 g Intravenous Stopped 08/12/22 1506)    Mobility walks

## 2022-08-13 ENCOUNTER — Encounter: Payer: Self-pay | Admitting: Cardiovascular Disease

## 2022-08-13 DIAGNOSIS — K8050 Calculus of bile duct without cholangitis or cholecystitis without obstruction: Secondary | ICD-10-CM | POA: Diagnosis not present

## 2022-08-13 LAB — CBC
HCT: 40.3 % (ref 39.0–52.0)
Hemoglobin: 13 g/dL (ref 13.0–17.0)
MCH: 29.9 pg (ref 26.0–34.0)
MCHC: 32.3 g/dL (ref 30.0–36.0)
MCV: 92.6 fL (ref 80.0–100.0)
Platelets: 265 10*3/uL (ref 150–400)
RBC: 4.35 MIL/uL (ref 4.22–5.81)
RDW: 14.6 % (ref 11.5–15.5)
WBC: 8.6 10*3/uL (ref 4.0–10.5)
nRBC: 0 % (ref 0.0–0.2)

## 2022-08-13 LAB — COMPREHENSIVE METABOLIC PANEL
ALT: 177 U/L — ABNORMAL HIGH (ref 0–44)
AST: 161 U/L — ABNORMAL HIGH (ref 15–41)
Albumin: 3.2 g/dL — ABNORMAL LOW (ref 3.5–5.0)
Alkaline Phosphatase: 220 U/L — ABNORMAL HIGH (ref 38–126)
Anion gap: 10 (ref 5–15)
BUN: 25 mg/dL — ABNORMAL HIGH (ref 8–23)
CO2: 30 mmol/L (ref 22–32)
Calcium: 9.2 mg/dL (ref 8.9–10.3)
Chloride: 100 mmol/L (ref 98–111)
Creatinine, Ser: 1.05 mg/dL (ref 0.61–1.24)
GFR, Estimated: >60 mL/min (ref >60)
Glucose, Bld: 112 mg/dL — ABNORMAL HIGH (ref 70–99)
Potassium: 4.3 mmol/L (ref 3.5–5.1)
Sodium: 140 mmol/L (ref 135–145)
Total Bilirubin: 4.7 mg/dL — ABNORMAL HIGH (ref 0.3–1.2)
Total Protein: 6.6 g/dL (ref 6.5–8.1)

## 2022-08-13 LAB — GLUCOSE, CAPILLARY
Glucose-Capillary: 103 mg/dL — ABNORMAL HIGH (ref 70–99)
Glucose-Capillary: 113 mg/dL — ABNORMAL HIGH (ref 70–99)
Glucose-Capillary: 120 mg/dL — ABNORMAL HIGH (ref 70–99)
Glucose-Capillary: 121 mg/dL — ABNORMAL HIGH (ref 70–99)
Glucose-Capillary: 121 mg/dL — ABNORMAL HIGH (ref 70–99)
Glucose-Capillary: 129 mg/dL — ABNORMAL HIGH (ref 70–99)
Glucose-Capillary: 99 mg/dL (ref 70–99)

## 2022-08-13 LAB — PROTIME-INR
INR: 2.9 — ABNORMAL HIGH (ref 0.8–1.2)
Prothrombin Time: 30 seconds — ABNORMAL HIGH (ref 11.4–15.2)

## 2022-08-13 LAB — LIPASE, BLOOD: Lipase: 154 U/L — ABNORMAL HIGH (ref 11–51)

## 2022-08-13 LAB — MAGNESIUM: Magnesium: 2.2 mg/dL (ref 1.7–2.4)

## 2022-08-13 LAB — HEMOGLOBIN A1C
Hgb A1c MFr Bld: 5.7 % — ABNORMAL HIGH (ref 4.8–5.6)
Mean Plasma Glucose: 116.89 mg/dL

## 2022-08-13 LAB — PHOSPHORUS: Phosphorus: 2.5 mg/dL (ref 2.5–4.6)

## 2022-08-13 MED ORDER — LISINOPRIL 20 MG PO TABS
40.0000 mg | ORAL_TABLET | Freq: Every day | ORAL | Status: DC
  Administered 2022-08-13 – 2022-08-18 (×6): 40 mg via ORAL
  Filled 2022-08-13 (×7): qty 2

## 2022-08-13 NOTE — TOC CM/SW Note (Signed)
  Transition of Care [facility]) Screening Note   Patient Details  Name: Luis Natal, PhD Date of Birth: December 01, 1950   Transition of Care [facility]) CM/SW Contact:    Howell Rucks, RN Phone Number: 08/13/2022, 1:53 PM    Transition of Care Department [facility]) has reviewed patient and no TOC needs have been identified at this time. We will continue to monitor patient advancement through interdisciplinary progression rounds. If new patient transition needs arise, please place a TOC consult.

## 2022-08-13 NOTE — Progress Notes (Addendum)
PROGRESS NOTE  Luis Natal, PhD ZOX:[id number] DOB: February 14, 1951 DOA: 08/12/2022 PCP: [name], MD   LOS: 1 day   Brief Narrative / Interim history: 72 year old male with history of MVP status post mitral valve replacement, mechanical aortic valve replacement, HTN, DM2, HLD who comes into the hospital with postprandial right upper quadrant abdominal pain for several days, associated with chills, nausea.  Also reports a 6 pound unintentional weight loss, possibly related to semaglutide.  He was seen by his PCP, who found patient had elevated LFTs and he was sent to the hospital.  Imaging with concern for cholelithiasis as well as choledocholithiasis.  GI and surgery were consulted.  He was placed on antibiotics and admitted to the hospital  Subjective / 24h Interval events: Doing well this morning, he tells me he slept well overnight, better than the last few nights.  No nausea or vomiting.  Abdominal pain better.  Assesement and Plan: Principal problem Cholecystitis, choledocholithiasis -evidenced on the MRI/MRCP obtained on admission.  General surgery as well as gastroenterology consulted.  Likely needs ERCP followed by cholecystectomy, timing per GI and surgery given elevated INR in the setting of Coumadin use.  Active problems Mitral and aortic valve replacements -on Coumadin, goal INR 2.5-3.5.  INR this morning 2.9.  Discussed with GI, will allow INR to drift down without vitamin K intervention for now.  If is less than 2 L potentially do the ERCP tomorrow.  When INR gets below 2.5, patient needs to be on IV heparin  Elevated LFTs -due to #1, LFTs already improving this morning  DM2 - continue SSI. Hold home agents  Lab Results  Component Value Date   HGBA1C 5.7 (H) 08/13/2022    Mild AKI -due to dehydration, creatinine normalized  Hyperlipidemia -hold home regimen due to elevated LFTs  Hypertension, diastolic CHF -continue home nebivolol, hold lisinopril  Scheduled  Meds:  insulin aspart  0-9 Units Subcutaneous Q4H   nebivolol  2.5 mg Oral Daily   pantoprazole  40 mg Oral Daily   Continuous Infusions:  ampicillin-sulbactam (UNASYN) IV 3 g (08/13/22 0853)   lactated ringers Stopped (08/12/22 1748)   PRN Meds:.HYDROmorphone (DILAUDID) injection, melatonin, oxyCODONE, polyethylene glycol, prochlorperazine  Current Outpatient Medications  Medication Instructions   AFRIN NASAL SPRAY 0.05 % nasal spray 1 spray, Each Nare, 2 times daily PRN   allopurinol (ZYLOPRIM) 300 mg, Oral, Daily   amoxicillin (AMOXIL) 2,000 mg, Oral, See admin instructions, Take 2,000 mg by mouth one hour prior to dental procedures   colchicine 0.6 mg, Oral, See admin instructions, Take 0.6 mg by mouth once a day as directed for gout flares   icosapent Ethyl (VASCEPA) 2 g, Oral, 2 times daily   lisinopril (ZESTRIL) 40 mg, Oral, Daily, Reported on 07/14/2015   metFORMIN (GLUCOPHAGE) 500 mg, Oral, 2 times daily   montelukast (SINGULAIR) 10 mg, Oral, Every morning   Mucinex 600 mg, Oral, Every morning   nebivolol (BYSTOLIC) 5 mg, Daily   Nexletol 180 mg, Oral, Daily   omeprazole (PRILOSEC) 40 MG capsule TAKE 1 CAPSULE(40 MG) BY MOUTH DAILY   OVER THE COUNTER MEDICATION 1-2 tablets, Oral, See admin instructions, TYLENOL Sinus + Headache Non-Drowsy Daytime Caplets for Nasal Congestion, Sinus Pressure & Pain Relief- Take 1-2 caplets by mouth every six hours as needed for migraines   Psyllium (METAMUCIL PO) 4 capsules, Oral, Daily with breakfast   Rybelsus 14 mg, Daily   sucralfate (CARAFATE) 1 g, Oral, 4 times daily   warfarin (COUMADIN)  5 MG tablet TAKE 1 TABLET BY MOUTH EVERY DAY OR AS DIRECTED    Diet Orders (From admission, onward)     Start     Ordered   08/14/22 0001  Diet NPO time specified  Diet effective midnight        08/13/22 0851   08/13/22 0852  Diet clear liquid Room service appropriate? Yes; Fluid consistency: Thin  Diet effective now       Question Answer Comment   Room service appropriate? Yes   Fluid consistency: Thin      08/13/22 0851            DVT prophylaxis:    Lab Results  Component Value Date   PLT 265 08/13/2022      Code Status: Full Code  Family Communication: no family at bedside   Status is: Inpatient Remains inpatient appropriate because: severity of illness   Level of care: Progressive  Consultants:  GI General surgery   Objective: Vitals:   08/13/22 0006 08/13/22 0313 08/13/22 0500 08/13/22 0820  BP: 118/78 117/78    Pulse: 85 83    Resp: 17 17  17   Temp: 98.1 F (36.7 C) 98.9 F (37.2 C)    TempSrc: Oral Oral    SpO2: 94% 93%    Weight:   82.7 kg   Height:        Intake/Output Summary (Last 24 hours) at 08/13/2022 1005 Last data filed at 08/13/2022 0400 Gross per 24 hour  Intake 310.49 ml  Output --  Net 310.49 ml   Wt Readings from Last 3 Encounters:  08/13/22 82.7 kg  04/08/22 92.6 kg  02/22/22 93.3 kg    Examination:  Constitutional: NAD Eyes: faint scleral icterus ENMT: Mucous membranes are moist.  Neck: normal, supple Respiratory: clear to auscultation bilaterally, no wheezing, no crackles. Normal respiratory effort. No accessory muscle use.  Cardiovascular: Regular rate and rhythm, mechanical click present. No LE edema.  Abdomen: non distended, no tenderness. Bowel sounds positive.  Musculoskeletal: no clubbing / cyanosis.   Data Reviewed: I have independently reviewed following labs and imaging studies   CBC Recent Labs  Lab 08/12/22 1259 08/13/22 0400  WBC 12.7* 8.6  HGB 13.8 13.0  HCT 42.3 40.3  PLT 292 265  MCV 89.8 92.6  MCH 29.3 29.9  MCHC 32.6 32.3  RDW 14.5 14.6  LYMPHSABS 1.5  --   MONOABS 1.3*  --   EOSABS 0.0  --   BASOSABS 0.1  --     Recent Labs  Lab 08/12/22 1259 08/12/22 1437 08/13/22 0400  NA 133*  --  140  K 4.3  --  4.3  CL 96*  --  100  CO2 25  --  30  GLUCOSE 142*  --  112*  BUN 23  --  25*  CREATININE 1.27*  --  1.05  CALCIUM  9.1  --  9.2  AST 250*  --  161*  ALT 227*  --  177*  ALKPHOS 248*  --  220*  BILITOT 7.5*  --  4.7*  ALBUMIN 3.7  --  3.2*  MG  --   --  2.2  INR  --  3.0* 2.9*  HGBA1C  --   --  5.7*    ------------------------------------------------------------------------------------------------------------------ No results for input(s): "CHOL", "HDL", "LDLCALC", "TRIG", "CHOLHDL", "LDLDIRECT" in the last 72 hours.  Lab Results  Component Value Date   HGBA1C 5.7 (H) 08/13/2022   ------------------------------------------------------------------------------------------------------------------ No results for input(s): "TSH", "  T4TOTAL", "T3FREE", "THYROIDAB" in the last 72 hours.  Invalid input(s): "FREET3"  Cardiac Enzymes No results for input(s): "CKMB", "TROPONINI", "MYOGLOBIN" in the last 168 hours.  Invalid input(s): "CK" ------------------------------------------------------------------------------------------------------------------ No results found for: "BNP"  CBG: Recent Labs  Lab 08/12/22 1658 08/12/22 2032 08/13/22 0008 08/13/22 0309 08/13/22 0758  GLUCAP 91 100* 120* 103* 121*    No results found for this or any previous visit (from the past 240 hour(s)).   Radiology Studies: MR ABDOMEN MRCP W WO CONTAST  Result Date: 08/12/2022 CLINICAL DATA:  Jaundice, cholelithiasis, biliary ductal dilatation by ultrasound EXAM: MRI ABDOMEN WITHOUT AND WITH CONTRAST (INCLUDING MRCP) TECHNIQUE: Multiplanar multisequence MR imaging of the abdomen was performed both before and after the administration of intravenous contrast. Heavily T2-weighted images of the biliary and pancreatic ducts were obtained, and three-dimensional MRCP images were rendered by post processing. CONTRAST:  7.30mL GADAVIST GADOBUTROL 1 MMOL/ML IV SOLN COMPARISON:  Same-day right upper quadrant ultrasound FINDINGS: Lower chest: No acute abnormality. Elevation of the right hemidiaphragm. Hepatobiliary: No solid liver  abnormality is seen. Distended gallbladder containing multiple gallstones. Gallbladder wall thickening or pericholecystic fluid. Intra and extrahepatic biliary ductal dilatation, the common bile duct measuring up to 1.7 cm in caliber, with numerous calculi stacked within the common bile duct, at least 6, measuring up to 1.1 cm in caliber (series 3, image 15). Pancreas: Unremarkable. No pancreatic ductal dilatation or surrounding inflammatory changes. Spleen: Normal in size without significant abnormality. Adrenals/Urinary Tract: Adrenal glands are unremarkable. Simple, benign right renal cortical cysts, for which no further follow-up or characterization is required. Kidneys are otherwise normal, without renal calculi, solid lesion, or hydronephrosis. Stomach/Bowel: Stomach is within normal limits. No evidence of bowel wall thickening, distention, or inflammatory changes. Vascular/Lymphatic: No significant vascular findings are present. No enlarged abdominal lymph nodes. Other: No abdominal wall hernia or abnormality. No ascites. Musculoskeletal: No acute or significant osseous findings. IMPRESSION: 1. Distended gallbladder containing multiple gallstones. No gallbladder wall thickening or pericholecystic fluid. 2. Intra and extrahepatic biliary ductal dilatation, the common bile duct measuring up to 1.7 cm in caliber, with numerous calculi stacked within the common bile duct, at least 6, measuring up to 1.1 cm in caliber. These results will be called to the ordering clinician or representative by the Radiologist Assistant, and communication documented in the PACS or [employer]. Electronically Signed   By: Jearld Lesch M.D.   On: 08/12/2022 21:32   MR 3D Recon At Scanner  Result Date: 08/12/2022 CLINICAL DATA:  Jaundice, cholelithiasis, biliary ductal dilatation by ultrasound EXAM: MRI ABDOMEN WITHOUT AND WITH CONTRAST (INCLUDING MRCP) TECHNIQUE: Multiplanar multisequence MR imaging of the abdomen was  performed both before and after the administration of intravenous contrast. Heavily T2-weighted images of the biliary and pancreatic ducts were obtained, and three-dimensional MRCP images were rendered by post processing. CONTRAST:  7.17mL GADAVIST GADOBUTROL 1 MMOL/ML IV SOLN COMPARISON:  Same-day right upper quadrant ultrasound FINDINGS: Lower chest: No acute abnormality. Elevation of the right hemidiaphragm. Hepatobiliary: No solid liver abnormality is seen. Distended gallbladder containing multiple gallstones. Gallbladder wall thickening or pericholecystic fluid. Intra and extrahepatic biliary ductal dilatation, the common bile duct measuring up to 1.7 cm in caliber, with numerous calculi stacked within the common bile duct, at least 6, measuring up to 1.1 cm in caliber (series 3, image 15). Pancreas: Unremarkable. No pancreatic ductal dilatation or surrounding inflammatory changes. Spleen: Normal in size without significant abnormality. Adrenals/Urinary Tract: Adrenal glands are unremarkable. Simple, benign right renal cortical  cysts, for which no further follow-up or characterization is required. Kidneys are otherwise normal, without renal calculi, solid lesion, or hydronephrosis. Stomach/Bowel: Stomach is within normal limits. No evidence of bowel wall thickening, distention, or inflammatory changes. Vascular/Lymphatic: No significant vascular findings are present. No enlarged abdominal lymph nodes. Other: No abdominal wall hernia or abnormality. No ascites. Musculoskeletal: No acute or significant osseous findings. IMPRESSION: 1. Distended gallbladder containing multiple gallstones. No gallbladder wall thickening or pericholecystic fluid. 2. Intra and extrahepatic biliary ductal dilatation, the common bile duct measuring up to 1.7 cm in caliber, with numerous calculi stacked within the common bile duct, at least 6, measuring up to 1.1 cm in caliber. These results will be called to the ordering clinician or  representative by the Radiologist Assistant, and communication documented in the PACS or [employer]. Electronically Signed   By: Jearld Lesch M.D.   On: 08/12/2022 21:32   US Abdomen Limited RUQ (LIVER/GB)  Result Date: 08/12/2022 CLINICAL DATA:  Right upper quadrant pain. EXAM: ULTRASOUND ABDOMEN LIMITED RIGHT UPPER QUADRANT COMPARISON:  None Available. FINDINGS: Gallbladder: Layering large calculus measures 2.3 cm seen in the neck of the gallbladder. There is moderate gallbladder wall thickening measuring 6.3 mm. Common bile duct: Diameter: 1.6 cm Liver: No focal lesion identified. Within normal limits in parenchymal echogenicity. Limited visualization of the left lobe. Portal vein is patent on color Doppler imaging with normal direction of blood flow towards the liver. Other: None. IMPRESSION: Cholelithiasis with moderate gallbladder wall thickening. No pericholecystic fluid seen. In the correct clinical settings this may represent acute cholecystitis Dilation of the common bile duct to 1.6 cm, concerning for choledocholithiasis or other source of downstream biliary ductal obstruction. Electronically Signed   By: Ted Mcalpine M.D.   On: 08/12/2022 13:38     Pamella Pert, MD, PhD [facility]  Between 7 am - 7 pm I am available, please contact me via Amion (for emergencies) or Securechat (non urgent messages)  Between 7 pm - 7 am I am not available, please contact night coverage MD/APP via Amion

## 2022-08-13 NOTE — Consult Note (Signed)
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  He did not have any fevers but continued to feel unwell with recurrence of right upper quadrant and epigastric pain after eating.  He went to his primary care physician and had labs drawn which showed abnormal LFTs and was subsequently advised to proceed to the ER. Ultrasound in ER showed cholelithiasis with gallbladder thickening, 6.3 mm, CBD of 1.6 cm, 2.3 cm large calculus in neck of gallbladder. Follow-up MRCP showed distended gallbladder containing multiple stones, no gallbladder wall thickening or pericholecystic fluid, intra and extrahepatic biliary ductal dilatation, CBD 1.7 cm with numerous calculi stacked and common bile duct, at least 6 measuring up to 1.1 cm in caliber.  Patient states he has lost about 18 pounds in the last [redacted] weeks along with early satiety which he thinks is related to use of semaglutide for prediabetes. He has not noted any change in bowel habits. About 3 years ago he had rectal bleeding for which he underwent a colonoscopy with Dr. Karilyn Cota, which showed pan-diverticulosis. Patient denies acid reflux or heartburn. He is on omeprazole for history of hiatal hernia.  He states his mother died of stomach cancer when he was in his 8s. No family history of colon cancer, esophageal cancer, pancreatic cancer.   Past Medical History:  Diagnosis Date   ALLERGIC RHINITIS    Diabetes mellitus without complication (HCC)    H/O mitral valve replacement    10 yrs ago   HYPERLIPIDEMIA    Hypertension     Long term current use of anticoagulant    MITRAL VALVE PROLAPSE    PREMATURE VENTRICULAR CONTRACTIONS     Past Surgical History:  Procedure Laterality Date   APPENDECTOMY     COLONOSCOPY N/A 03/29/2014   Procedure: COLONOSCOPY;  Surgeon: Malissa Hippo, MD;  Location: [facility];  Service: Endoscopy;  Laterality: N/A;  855   COLONOSCOPY N/A 06/01/2019   Rehman: ileum normal, diverticulosis in entire colon, external hemorhoids, no specimens   ESOPHAGOGASTRODUODENOSCOPY N/A 03/29/2014   Procedure: ESOPHAGOGASTRODUODENOSCOPY (EGD);  Surgeon: Malissa Hippo, MD;  Location: [facility];  Service: Endoscopy;  Laterality: N/A;   MITRAL VALVE REPLACEMENT     SHOULDER SURGERY      Prior to Admission medications   Medication Sig Start Date End Date Taking? Authorizing Provider  AFRIN NASAL SPRAY 0.05 % nasal spray Place 1 spray into both nostrils 2 (two) times daily as needed for congestion.   Yes [provider]  allopurinol (ZYLOPRIM) 300 MG tablet Take 300 mg by mouth daily.   Yes [provider]  amoxicillin (AMOXIL) 500 MG capsule Take 2,000 mg by mouth See admin instructions. Take 2,000 mg by mouth one hour prior to dental procedures   Yes [provider]  colchicine 0.6 MG tablet Take 0.6 mg by mouth See admin instructions. Take 0.6 mg by mouth once a day as directed for gout flares   Yes [provider]  guaiFENesin (MUCINEX) 600 MG 12 hr tablet Take 600 mg by mouth in the morning.   Yes [provider]  icosapent Ethyl (VASCEPA) 1  g capsule Take 2 g by mouth 2 (two) times daily. 05/02/19  Yes [provider]  lisinopril (PRINIVIL,ZESTRIL) 40 MG tablet Take 40 mg by mouth daily. Reported on 07/14/2015   Yes [provider]  metFORMIN (GLUCOPHAGE) 500 MG tablet Take 500 mg by mouth in the morning and at bedtime.   Yes [provider]  montelukast (SINGULAIR) 10 MG tablet Take 10 mg by mouth in the morning.   Yes  [provider]  nebivolol (BYSTOLIC) 5 MG tablet Take 5 mg by mouth daily.   Yes [provider]  NEXLETOL 180 MG TABS Take 180 mg by mouth daily.   Yes [provider]  omeprazole (PRILOSEC) 40 MG capsule TAKE 1 CAPSULE(40 MG) BY MOUTH DAILY Patient taking differently: Take 40 mg by mouth daily before breakfast. 06/21/22  Yes [name], [name], NP  OVER THE COUNTER MEDICATION Take 1-2 tablets by mouth See admin instructions. TYLENOL Sinus + Headache Non-Drowsy Daytime Caplets for Nasal Congestion, Sinus Pressure & Pain Relief- Take 1-2 caplets by mouth every six hours as needed for migraines   Yes [provider]  Psyllium (METAMUCIL PO) Take 4 capsules by mouth daily with breakfast.   Yes [provider]  warfarin (COUMADIN) 5 MG tablet TAKE 1 TABLET BY MOUTH EVERY DAY OR AS DIRECTED Patient taking differently: Take 5 mg by mouth at bedtime. 01/18/22  Yes [name], MD  RYBELSUS 14 MG TABS Take 14 mg by mouth daily. Patient not taking: Reported on 08/12/2022    [provider]  sucralfate (CARAFATE) 1 GM/10ML suspension Take 10 mLs (1 g total) by mouth 4 (four) times daily. Patient not taking: Reported on 08/12/2022 04/08/22   [name], NP    Current Facility-Administered Medications  Medication Dose Route Frequency Provider Last Rate Last Admin   Ampicillin-Sulbactam (UNASYN) 3 g in sodium chloride 0.9 % 100 mL IVPB  3 g Intravenous Q6H Dow Adolph N, DO   Stopped at 08/13/22 1610   HYDROmorphone (DILAUDID) injection 0.5 mg  0.5 mg Intravenous Q4H PRN Dow Adolph N, DO       insulin aspart (novoLOG) injection 0-9 Units  0-9 Units Subcutaneous Q4H Dow Adolph N, DO   1 Units at 08/13/22 9604   lactated ringers infusion   Intravenous Continuous Dow Adolph N, DO   Stopped at 08/12/22 1748   melatonin tablet 5 mg  5 mg Oral QHS PRN Dow Adolph N, DO   5 mg at 08/12/22 2114   nebivolol (BYSTOLIC) tablet 2.5 mg  2.5 mg Oral  Daily Dow Adolph N, DO   2.5 mg at 08/12/22 1841   oxyCODONE (Oxy IR/ROXICODONE) immediate release tablet 5 mg  5 mg Oral Q6H PRN Dow Adolph N, DO       pantoprazole (PROTONIX) EC tablet 40 mg  40 mg Oral Daily Dow Adolph N, DO   40 mg at 08/12/22 1841   polyethylene glycol (MIRALAX / GLYCOLAX) packet 17 g  17 g Oral Daily PRN Dow Adolph N, DO       prochlorperazine (COMPAZINE) injection 5 mg  5 mg Intravenous Q6H PRN Dow Adolph N, DO        Allergies as of 08/12/2022 - Review Complete 08/12/2022  Allergen Reaction Noted   Erythromycin Other (See Comments) 02/18/2012   Sulfamethoxazole Other (See Comments) 12/17/2013   Vytorin [ezetimibe-simvastatin] Other (See Comments) 08/12/2022    History reviewed. No pertinent family history.  Social History   Socioeconomic  History   Marital status: Married    Spouse name: Not on file   Number of children: Not on file   Years of education: Not on file   Highest education level: Not on file  Occupational History   Not on file  Tobacco Use   Smoking status: Never   Smokeless tobacco: Never  Vaping Use   Vaping Use: Never used  Substance and Sexual Activity   Alcohol use: Yes    Comment: occ   Drug use: No   Sexual activity: Not on file  Other Topics Concern   Not on file  Social History Narrative   Not on file   Social Determinants of Health   Financial Resource Strain: Not on file  Food Insecurity: No Food Insecurity (08/12/2022)   Hunger Vital Sign    Worried About Running Out of Food in the Last Year: Never true    Ran Out of Food in the Last Year: Never true  Transportation Needs: No Transportation Needs (08/12/2022)   PRAPARE - Administrator, Civil Service (Medical): No    Lack of Transportation (Non-Medical): No  Physical Activity: Not on file  Stress: Not on file  Social Connections: Not on file  Intimate Partner Violence: Not At Risk (08/12/2022)   Humiliation, Afraid, Rape, and Kick questionnaire     Fear of Current or Ex-Partner: No    Emotionally Abused: No    Physically Abused: No    Sexually Abused: No    Review of Systems: As per HPI Physical Exam: Vital signs in last 24 hours: Temp:  [98.1 F (36.7 C)-98.9 F (37.2 C)] 98.9 F (37.2 C) (04/26 0313) Pulse Rate:  [83-90] 83 (04/26 0313) Resp:  [15-20] 17 (04/26 0313) BP: (115-134)/(78-89) 117/78 (04/26 0313) SpO2:  [93 %-98 %] 93 % (04/26 0313) Weight:  [82.7 kg-83.9 kg] 82.7 kg (04/26 0500)    General:   Alert,  Well-developed, well-nourished, pleasant and cooperative in NAD Head:  Normocephalic and atraumatic. Eyes: Mild icterus.   Conjunctiva pink. Ears:  Normal auditory acuity. Nose:  No deformity, discharge,  or lesions. Mouth:  No deformity or lesions.  Oropharynx pink & moist. Neck:  Supple; no masses or thyromegaly. Lungs:  Clear throughout to auscultation.   No wheezes, crackles, or rhonchi. No acute distress. Heart:  Regular rate and rhythm; no murmurs, clicks, rubs,  or gallops. Extremities:  Without clubbing or edema. Neurologic:  Alert and  oriented x4;  grossly normal neurologically. Skin:  Intact without significant lesions or rashes. Psych:  Alert and cooperative. Normal mood and affect. Abdomen:  Soft, nontender and nondistended. No masses, hepatosplenomegaly or hernias noted. Normal bowel sounds, without guarding, and without rebound.         Lab Results: Recent Labs    08/12/22 1259 08/13/22 0400  WBC 12.7* 8.6  HGB 13.8 13.0  HCT 42.3 40.3  PLT 292 265   BMET Recent Labs    08/12/22 1259 08/13/22 0400  NA 133* 140  K 4.3 4.3  CL 96* 100  CO2 25 30  GLUCOSE 142* 112*  BUN 23 25*  CREATININE 1.27* 1.05  CALCIUM 9.1 9.2   LFT Recent Labs    08/13/22 0400  PROT 6.6  ALBUMIN 3.2*  AST 161*  ALT 177*  ALKPHOS 220*  BILITOT 4.7*   PT/INR Recent Labs    08/12/22 1437 08/13/22 0400  LABPROT 31.0* 30.0*  INR 3.0* 2.9*    Studies/Results: MR ABDOMEN MRCP W  WO  CONTAST  Result Date: 08/12/2022 CLINICAL DATA:  Jaundice, cholelithiasis, biliary ductal dilatation by ultrasound EXAM: MRI ABDOMEN WITHOUT AND WITH CONTRAST (INCLUDING MRCP) TECHNIQUE: Multiplanar multisequence MR imaging of the abdomen was performed both before and after the administration of intravenous contrast. Heavily T2-weighted images of the biliary and pancreatic ducts were obtained, and three-dimensional MRCP images were rendered by post processing. CONTRAST:  7.31mL GADAVIST GADOBUTROL 1 MMOL/ML IV SOLN COMPARISON:  Same-day right upper quadrant ultrasound FINDINGS: Lower chest: No acute abnormality. Elevation of the right hemidiaphragm. Hepatobiliary: No solid liver abnormality is seen. Distended gallbladder containing multiple gallstones. Gallbladder wall thickening or pericholecystic fluid. Intra and extrahepatic biliary ductal dilatation, the common bile duct measuring up to 1.7 cm in caliber, with numerous calculi stacked within the common bile duct, at least 6, measuring up to 1.1 cm in caliber (series 3, image 15). Pancreas: Unremarkable. No pancreatic ductal dilatation or surrounding inflammatory changes. Spleen: Normal in size without significant abnormality. Adrenals/Urinary Tract: Adrenal glands are unremarkable. Simple, benign right renal cortical cysts, for which no further follow-up or characterization is required. Kidneys are otherwise normal, without renal calculi, solid lesion, or hydronephrosis. Stomach/Bowel: Stomach is within normal limits. No evidence of bowel wall thickening, distention, or inflammatory changes. Vascular/Lymphatic: No significant vascular findings are present. No enlarged abdominal lymph nodes. Other: No abdominal wall hernia or abnormality. No ascites. Musculoskeletal: No acute or significant osseous findings. IMPRESSION: 1. Distended gallbladder containing multiple gallstones. No gallbladder wall thickening or pericholecystic fluid. 2. Intra and extrahepatic  biliary ductal dilatation, the common bile duct measuring up to 1.7 cm in caliber, with numerous calculi stacked within the common bile duct, at least 6, measuring up to 1.1 cm in caliber. These results will be called to the ordering clinician or representative by the Radiologist Assistant, and communication documented in the PACS or [employer]. Electronically Signed   By: Jearld Lesch M.D.   On: 08/12/2022 21:32   MR 3D Recon At Scanner  Result Date: 08/12/2022 CLINICAL DATA:  Jaundice, cholelithiasis, biliary ductal dilatation by ultrasound EXAM: MRI ABDOMEN WITHOUT AND WITH CONTRAST (INCLUDING MRCP) TECHNIQUE: Multiplanar multisequence MR imaging of the abdomen was performed both before and after the administration of intravenous contrast. Heavily T2-weighted images of the biliary and pancreatic ducts were obtained, and three-dimensional MRCP images were rendered by post processing. CONTRAST:  7.33mL GADAVIST GADOBUTROL 1 MMOL/ML IV SOLN COMPARISON:  Same-day right upper quadrant ultrasound FINDINGS: Lower chest: No acute abnormality. Elevation of the right hemidiaphragm. Hepatobiliary: No solid liver abnormality is seen. Distended gallbladder containing multiple gallstones. Gallbladder wall thickening or pericholecystic fluid. Intra and extrahepatic biliary ductal dilatation, the common bile duct measuring up to 1.7 cm in caliber, with numerous calculi stacked within the common bile duct, at least 6, measuring up to 1.1 cm in caliber (series 3, image 15). Pancreas: Unremarkable. No pancreatic ductal dilatation or surrounding inflammatory changes. Spleen: Normal in size without significant abnormality. Adrenals/Urinary Tract: Adrenal glands are unremarkable. Simple, benign right renal cortical cysts, for which no further follow-up or characterization is required. Kidneys are otherwise normal, without renal calculi, solid lesion, or hydronephrosis. Stomach/Bowel: Stomach is within normal limits. No  evidence of bowel wall thickening, distention, or inflammatory changes. Vascular/Lymphatic: No significant vascular findings are present. No enlarged abdominal lymph nodes. Other: No abdominal wall hernia or abnormality. No ascites. Musculoskeletal: No acute or significant osseous findings. IMPRESSION: 1. Distended gallbladder containing multiple gallstones. No gallbladder wall thickening or pericholecystic fluid. 2. Intra and extrahepatic biliary  ductal dilatation, the common bile duct measuring up to 1.7 cm in caliber, with numerous calculi stacked within the common bile duct, at least 6, measuring up to 1.1 cm in caliber. These results will be called to the ordering clinician or representative by the Radiologist Assistant, and communication documented in the PACS or [employer]. Electronically Signed   By: Jearld Lesch M.D.   On: 08/12/2022 21:32   US Abdomen Limited RUQ (LIVER/GB)  Result Date: 08/12/2022 CLINICAL DATA:  Right upper quadrant pain. EXAM: ULTRASOUND ABDOMEN LIMITED RIGHT UPPER QUADRANT COMPARISON:  None Available. FINDINGS: Gallbladder: Layering large calculus measures 2.3 cm seen in the neck of the gallbladder. There is moderate gallbladder wall thickening measuring 6.3 mm. Common bile duct: Diameter: 1.6 cm Liver: No focal lesion identified. Within normal limits in parenchymal echogenicity. Limited visualization of the left lobe. Portal vein is patent on color Doppler imaging with normal direction of blood flow towards the liver. Other: None. IMPRESSION: Cholelithiasis with moderate gallbladder wall thickening. No pericholecystic fluid seen. In the correct clinical settings this may represent acute cholecystitis Dilation of the common bile duct to 1.6 cm, concerning for choledocholithiasis or other source of downstream biliary ductal obstruction. Electronically Signed   By: Ted Mcalpine M.D.   On: 08/12/2022 13:38    Impression: Cholelithiasis and choledocholithiasis T.  bili/AST/ALT/ALP: 7.5/118/250/242 admission  and 4.7/161/177/220 today  Minimally elevated lipase 118/154 with normal-appearing pancreas on MRCP  Mechanical mitral valve, since 2020, on warfarin, last dose 08/11/2022 INR 3 yesterday and 2.9 today   Comorbidities: Dyslipidemia, hypertension, diabetes  Plan: Patient will need ERCP, however INR needs to be at least less than 2, ideally around 1.7. Coumadin is on hold, last dose 08/11/2022, plans are to start heparin once INR is 2.5 or less.  No signs of cholangitis, currently without abdominal pain, fever and normal hemodynamics. Will start patient on clear liquid diet and keep him n.p.o. postmidnight, if INR is appropriate as mentioned above, ERCP tomorrow, if not may have to wait for another 24 hours. Patient on IV Unasyn 3 g every 6 hours and lactated Ringer's at 50 cc an hour. Recommend surgical evaluation for cholecystectomy, We will continue to follow,   LOS: 1 day   Kerin Salen, MD  08/13/2022, 8:52 AM

## 2022-08-14 DIAGNOSIS — K8050 Calculus of bile duct without cholangitis or cholecystitis without obstruction: Secondary | ICD-10-CM | POA: Diagnosis not present

## 2022-08-14 LAB — COMPREHENSIVE METABOLIC PANEL
ALT: 119 U/L — ABNORMAL HIGH (ref 0–44)
AST: 71 U/L — ABNORMAL HIGH (ref 15–41)
Albumin: 3.1 g/dL — ABNORMAL LOW (ref 3.5–5.0)
Alkaline Phosphatase: 209 U/L — ABNORMAL HIGH (ref 38–126)
Anion gap: 12 (ref 5–15)
BUN: 15 mg/dL (ref 8–23)
CO2: 26 mmol/L (ref 22–32)
Calcium: 8.7 mg/dL — ABNORMAL LOW (ref 8.9–10.3)
Chloride: 100 mmol/L (ref 98–111)
Creatinine, Ser: 0.97 mg/dL (ref 0.61–1.24)
GFR, Estimated: >60 mL/min (ref >60)
Glucose, Bld: 107 mg/dL — ABNORMAL HIGH (ref 70–99)
Potassium: 4.1 mmol/L (ref 3.5–5.1)
Sodium: 138 mmol/L (ref 135–145)
Total Bilirubin: 2.8 mg/dL — ABNORMAL HIGH (ref 0.3–1.2)
Total Protein: 6.3 g/dL — ABNORMAL LOW (ref 6.5–8.1)

## 2022-08-14 LAB — GLUCOSE, CAPILLARY
Glucose-Capillary: 103 mg/dL — ABNORMAL HIGH (ref 70–99)
Glucose-Capillary: 117 mg/dL — ABNORMAL HIGH (ref 70–99)
Glucose-Capillary: 119 mg/dL — ABNORMAL HIGH (ref 70–99)
Glucose-Capillary: 121 mg/dL — ABNORMAL HIGH (ref 70–99)
Glucose-Capillary: 136 mg/dL — ABNORMAL HIGH (ref 70–99)
Glucose-Capillary: 92 mg/dL (ref 70–99)

## 2022-08-14 LAB — CBC
HCT: 38.2 % — ABNORMAL LOW (ref 39.0–52.0)
Hemoglobin: 12.1 g/dL — ABNORMAL LOW (ref 13.0–17.0)
MCH: 28.8 pg (ref 26.0–34.0)
MCHC: 31.7 g/dL (ref 30.0–36.0)
MCV: 91 fL (ref 80.0–100.0)
Platelets: 282 10*3/uL (ref 150–400)
RBC: 4.2 MIL/uL — ABNORMAL LOW (ref 4.22–5.81)
RDW: 14.3 % (ref 11.5–15.5)
WBC: 7.7 10*3/uL (ref 4.0–10.5)
nRBC: 0 % (ref 0.0–0.2)

## 2022-08-14 LAB — MAGNESIUM: Magnesium: 1.8 mg/dL (ref 1.7–2.4)

## 2022-08-14 LAB — HEPARIN LEVEL (UNFRACTIONATED): Heparin Unfractionated: <0.1 IU/mL — ABNORMAL LOW (ref 0.30–0.70)

## 2022-08-14 LAB — APTT: aPTT: 87 seconds — ABNORMAL HIGH (ref 24–36)

## 2022-08-14 LAB — PROTIME-INR
INR: 2.4 — ABNORMAL HIGH (ref 0.8–1.2)
Prothrombin Time: 26 seconds — ABNORMAL HIGH (ref 11.4–15.2)

## 2022-08-14 MED ORDER — ALLOPURINOL 300 MG PO TABS
300.0000 mg | ORAL_TABLET | Freq: Every day | ORAL | Status: DC
  Administered 2022-08-14 – 2022-08-18 (×5): 300 mg via ORAL
  Filled 2022-08-14 (×5): qty 1

## 2022-08-14 MED ORDER — HEPARIN (PORCINE) 25000 UT/250ML-% IV SOLN
1850.0000 [IU]/h | INTRAVENOUS | Status: DC
  Administered 2022-08-14: 1000 [IU]/h via INTRAVENOUS
  Administered 2022-08-15: 1600 [IU]/h via INTRAVENOUS
  Administered 2022-08-16: 1850 [IU]/h via INTRAVENOUS
  Filled 2022-08-14 (×4): qty 250

## 2022-08-14 MED ORDER — COLCHICINE 0.6 MG PO TABS
0.6000 mg | ORAL_TABLET | Freq: Every day | ORAL | Status: DC
  Administered 2022-08-14 – 2022-08-18 (×5): 0.6 mg via ORAL
  Filled 2022-08-14 (×5): qty 1

## 2022-08-14 NOTE — Progress Notes (Signed)
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)  Allergies  Allergen Reactions   Erythromycin Other (See Comments)    GI Intolerance   Sulfamethoxazole Other (See Comments)    GI Intolerance   Vytorin [Ezetimibe-Simvastatin] Other (See Comments)    LEG CRAMPS    Patient Measurements: Height: 6\' 1"  (185.4 cm) Weight: 82.4 kg (181 lb 10.5 oz) IBW/kg (Calculated) : 79.9 Heparin Dosing Weight: total body weight  Vital Signs: Temp: 98.1 F (36.7 C) (04/27 0340) Temp Source: Oral (04/26 2320) BP: 135/86 (04/27 0340) Pulse Rate: 80 (04/27 0340)  Labs: Recent Labs    08/12/22 1259 08/12/22 1437 08/13/22 0400 08/14/22 0430  HGB 13.8  --  13.0 12.1*  HCT 42.3  --  40.3 38.2*  PLT 292  --  265 282  APTT  --  54*  --   --   LABPROT  --  31.0* 30.0* 26.0*  INR  --  3.0* 2.9* 2.4*  CREATININE 1.27*  --  1.05  --      Estimated Creatinine Clearance: 71.9 mL/min (by C-G formula based on SCr of 1.05 mg/dL).   Medical History: Past Medical History:  Diagnosis Date   ALLERGIC RHINITIS    Diabetes mellitus without complication (HCC)    H/O mitral valve replacement    10 yrs ago   HYPERLIPIDEMIA    Hypertension    Long term current use of anticoagulant    MITRAL VALVE PROLAPSE    PREMATURE VENTRICULAR CONTRACTIONS     Medications:  PTA Warfarin 5mg  PO daily-last dose 08/11/22 at 2115 INR goal 2.5-3.5 per Anticoag Clinic notes  Assessment: 16 y/oM with PMH of mechanical MVR on warfarin PTA who presents with right upper quadrant abdominal pain after eating. [place]: Cholelithiasis with moderate gallbladder wall thickening. No pericholecystic fluid seen. In the correct clinical settings this may represent acute cholecystitis. Dilation of the common bile duct to 1.6 cm, concerning for choledocholithiasis or other source of downstream biliary ductal obstruction.  Warfarin held on admission in case surgical intervention  required. Pharmacy consulted for heparin dosing. INR = 3. Hgb 13.8, Plt 292.    08/14/2022: INR 2.4 CBC: Hg slightly low/stable since admission; pltc 282 Noted plans for ERCP once INR<2; possible cholecystectomy to follow  Goal of Therapy:  Heparin level 0.3-0.7 units/ml Monitor platelets by anticoagulation protocol: Yes   Plan:  Start IV heparin 1000 units/hr Check heparin level in 8 hours Daily heparin level, CBC, INR F/U plans for procedures/need to hold heparin  Junita Push, PharmD, BCPS 08/14/2022,5:48 AM

## 2022-08-14 NOTE — Progress Notes (Signed)
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)  Allergies  Allergen Reactions   Erythromycin Other (See Comments)    GI Intolerance   Sulfamethoxazole Other (See Comments)    GI Intolerance   Vytorin [Ezetimibe-Simvastatin] Other (See Comments)    LEG CRAMPS    Patient Measurements: Height: 6\' 1"  (185.4 cm) Weight: 82.4 kg (181 lb 10.5 oz) IBW/kg (Calculated) : 79.9 Heparin Dosing Weight: total body weight  Vital Signs: Temp: 99.1 F (37.3 C) (04/27 1456) Temp Source: Oral (04/27 1456) BP: 155/107 (04/27 1456) Pulse Rate: 87 (04/27 1456)  Labs: Recent Labs    08/12/22 1259 08/12/22 1437 08/13/22 0400 08/14/22 0430 08/14/22 1433  HGB 13.8  --  13.0 12.1*  --   HCT 42.3  --  40.3 38.2*  --   PLT 292  --  265 282  --   APTT  --  54*  --   --  87*  LABPROT  --  31.0* 30.0* 26.0*  --   INR  --  3.0* 2.9* 2.4*  --   HEPARINUNFRC  --   --   --   --  <0.10*  CREATININE 1.27*  --  1.05 0.97  --      Estimated Creatinine Clearance: 77.8 mL/min (by C-G formula based on SCr of 0.97 mg/dL).   Medical History: Past Medical History:  Diagnosis Date   ALLERGIC RHINITIS    Diabetes mellitus without complication (HCC)    H/O mitral valve replacement    10 yrs ago   HYPERLIPIDEMIA    Hypertension    Long term current use of anticoagulant    MITRAL VALVE PROLAPSE    PREMATURE VENTRICULAR CONTRACTIONS     Medications:  PTA Warfarin 5mg  PO daily-last dose 08/11/22 at 2115 INR goal 2.5-3.5 per Anticoag Clinic notes  Assessment: 20 y/oM with PMH of mechanical MVR on warfarin PTA who presents with right upper quadrant abdominal pain after eating. [place]: Cholelithiasis with moderate gallbladder wall thickening. No pericholecystic fluid seen. In the correct clinical settings this may represent acute cholecystitis. Dilation of the common bile duct to 1.6 cm, concerning for choledocholithiasis or other source of downstream  biliary ductal obstruction.  Warfarin held on admission in case surgical intervention required. Pharmacy consulted for heparin dosing. INR = 3. Hgb 13.8, Plt 292.    08/14/2022: INR 2.4 Heparin level undetectable and aPTT 87 (suspect levels are being impacted from elevated LFTs and Tbili) CBC: Hg slightly low/stable since admission; pltc 282 Noted plans for ERCP once INR<2; possible cholecystectomy to follow  Goal of Therapy:  Heparin level 0.3-0.7 units/ml Monitor platelets by anticoagulation protocol: Yes   Plan:  Increase IV heparin to 1,300 units/hr Check heparin level in 8 hours at midnight Daily CBC, INR F/U plans for procedures/need to hold heparin once INR < 2  Enzo Bi, PharmD, BCPS, BCIDP Clinical Pharmacist 08/14/2022 3:40 PM

## 2022-08-14 NOTE — Progress Notes (Signed)
[facility] Progress Note  Luis Natal, PhD 72 y.o. 1950-05-24  CC: Choledocholithiasis   Subjective: Patient seen and examined at bedside.  Sitting in the chair.  Not in acute distress.  Denies any abdominal pain.  ROS : Afebrile, negative for chest pain   Objective: Vital signs in last 24 hours: Vitals:   08/14/22 0730 08/14/22 0910  BP: (!) 143/96 (!) 143/88  Pulse: 85   Resp:    Temp: 98.1 F (36.7 C)   SpO2: 94%     Physical Exam: Sitting comfortably in the chair, not in acute distress.  Abdominal exam benign.  Alert and oriented x 3.  Mood and affect normal.  Lab Results: Recent Labs    08/13/22 0400 08/14/22 0430  NA 140 138  K 4.3 4.1  CL 100 100  CO2 30 26  GLUCOSE 112* 107*  BUN 25* 15  CREATININE 1.05 0.97  CALCIUM 9.2 8.7*  MG 2.2 1.8  PHOS 2.5  --    Recent Labs    08/13/22 0400 08/14/22 0430  AST 161* 71*  ALT 177* 119*  ALKPHOS 220* 209*  BILITOT 4.7* 2.8*  PROT 6.6 6.3*  ALBUMIN 3.2* 3.1*   Recent Labs    08/12/22 1259 08/13/22 0400 08/14/22 0430  WBC 12.7* 8.6 7.7  NEUTROABS 9.8*  --   --   HGB 13.8 13.0 12.1*  HCT 42.3 40.3 38.2*  MCV 89.8 92.6 91.0  PLT 292 265 282   Recent Labs    08/13/22 0400 08/14/22 0430  LABPROT 30.0* 26.0*  INR 2.9* 2.4*      Assessment/Plan: -Abdominal pain and elevated LFTs.  Imaging findings showed choledocholithiasis and gallstones. -Mechanical mitral valve replacement.  Was on Coumadin.  INR 2.4 today.  Recommendations ------------------------ -No plan for ERCP today as INR remains elevated.  Tentative plan for ERCP tomorrow depending on INR as well as schedule availability.  Most likely, he may get ERCP done early next week. -Continue other supportive care.  Repeat INR in the morning.  GI will follow. -Discussed with Dr. Concha Se as well as hospitalist via epic chat.   Kathi Der MD, FACP 08/14/2022, 12:33 PM  Contact #  [phone]

## 2022-08-14 NOTE — Progress Notes (Signed)
PROGRESS NOTE  Luis Natal, PhD YQM:[id number] DOB: 12/03/1950 DOA: 08/12/2022 PCP: [name], MD   LOS: 2 days   Brief Narrative / Interim history: 72 year old male with history of MVP status post mitral valve replacement, mechanical aortic valve replacement, HTN, DM2, HLD who comes into the hospital with postprandial right upper quadrant abdominal pain for several days, associated with chills, nausea.  Also reports a 6 pound unintentional weight loss, possibly related to semaglutide.  He was seen by his PCP, who found patient had elevated LFTs and he was sent to the hospital.  Imaging with concern for cholelithiasis as well as choledocholithiasis.  GI and surgery were consulted.  He was placed on antibiotics and admitted to the hospital  Subjective / 24h Interval events: He is doing well today.  Denies any abdominal pain, no nausea or vomiting.  Had some clear liquids last night and tolerated that well  Assesement and Plan: Principal problem Cholecystitis, choledocholithiasis -evidenced on the MRI/MRCP obtained on admission.  General surgery as well as gastroenterology consulted.  Likely needs ERCP followed by cholecystectomy, timing per GI and surgery given elevated INR in the setting of Coumadin use. -INR 2.4 today  Active problems Mitral and aortic valve replacements -on Coumadin, goal INR 2.5-3.5.  INR this morning 2.9.  Discussed with GI, will allow INR to drift down without vitamin K intervention for now.  If is less than 2 L potentially do the ERCP tomorrow.  -On heparin drip since INR less than 2.5.  Elevated LFTs -due to #1, LFTs are continuing to improve today  DM2 - continue SSI. Hold home agents  Lab Results  Component Value Date   HGBA1C 5.7 (H) 08/13/2022    Mild AKI -due to dehydration, creatinine normalized  Hyperlipidemia -hold home regimen due to elevated LFTs  Hypertension, diastolic CHF -continue home regimen  Scheduled Meds:  insulin aspart  0-9  Units Subcutaneous Q4H   lisinopril  40 mg Oral Daily   nebivolol  2.5 mg Oral Daily   pantoprazole  40 mg Oral Daily   Continuous Infusions:  ampicillin-sulbactam (UNASYN) IV 3 g (08/14/22 0809)   heparin 1,000 Units/hr (08/14/22 0630)   lactated ringers 50 mL/hr at 08/13/22 1625   PRN Meds:.HYDROmorphone (DILAUDID) injection, melatonin, oxyCODONE, polyethylene glycol, prochlorperazine  Current Outpatient Medications  Medication Instructions   AFRIN NASAL SPRAY 0.05 % nasal spray 1 spray, Each Nare, 2 times daily PRN   allopurinol (ZYLOPRIM) 300 mg, Oral, Daily   amoxicillin (AMOXIL) 2,000 mg, Oral, See admin instructions, Take 2,000 mg by mouth one hour prior to dental procedures   colchicine 0.6 mg, Oral, See admin instructions, Take 0.6 mg by mouth once a day as directed for gout flares   icosapent Ethyl (VASCEPA) 2 g, Oral, 2 times daily   lisinopril (ZESTRIL) 40 mg, Oral, Daily, Reported on 07/14/2015   metFORMIN (GLUCOPHAGE) 500 mg, Oral, 2 times daily   montelukast (SINGULAIR) 10 mg, Oral, Every morning   Mucinex 600 mg, Oral, Every morning   nebivolol (BYSTOLIC) 5 mg, Daily   Nexletol 180 mg, Oral, Daily   omeprazole (PRILOSEC) 40 MG capsule TAKE 1 CAPSULE(40 MG) BY MOUTH DAILY   OVER THE COUNTER MEDICATION 1-2 tablets, Oral, See admin instructions, TYLENOL Sinus + Headache Non-Drowsy Daytime Caplets for Nasal Congestion, Sinus Pressure & Pain Relief- Take 1-2 caplets by mouth every six hours as needed for migraines   Psyllium (METAMUCIL PO) 4 capsules, Oral, Daily with breakfast   Rybelsus 14 mg, Daily  sucralfate (CARAFATE) 1 g, Oral, 4 times daily   warfarin (COUMADIN) 5 MG tablet TAKE 1 TABLET BY MOUTH EVERY DAY OR AS DIRECTED    Diet Orders (From admission, onward)     Start     Ordered   08/14/22 0001  Diet NPO time specified  Diet effective midnight        08/13/22 0851            DVT prophylaxis:    Lab Results  Component Value Date   PLT 282  08/14/2022      Code Status: Full Code  Family Communication: no family at bedside   Status is: Inpatient Remains inpatient appropriate because: severity of illness   Level of care: Progressive  Consultants:  GI General surgery   Objective: Vitals:   08/14/22 0340 08/14/22 0351 08/14/22 0730 08/14/22 0910  BP: 135/86  (!) 143/96 (!) 143/88  Pulse: 80  85   Resp: 17     Temp: 98.1 F (36.7 C)  98.1 F (36.7 C)   TempSrc:   Oral   SpO2: 93%  94%   Weight:  82.4 kg    Height:        Intake/Output Summary (Last 24 hours) at 08/14/2022 1010 Last data filed at 08/14/2022 0634 Gross per 24 hour  Intake 1620.85 ml  Output --  Net 1620.85 ml    Wt Readings from Last 3 Encounters:  08/14/22 82.4 kg  04/08/22 92.6 kg  02/22/22 93.3 kg    Examination:  Constitutional: NAD Eyes: lids and conjunctivae normal, no scleral icterus ENMT: mmm Neck: normal, supple Respiratory: clear to auscultation bilaterally, no wheezing, no crackles. Normal respiratory effort.  Cardiovascular: Regular rate and rhythm, no murmurs / rubs / gallops. No LE edema. Abdomen: soft, no distention, no tenderness. Bowel sounds positive.  Skin: no rashes  Data Reviewed: I have independently reviewed following labs and imaging studies   CBC Recent Labs  Lab 08/12/22 1259 08/13/22 0400 08/14/22 0430  WBC 12.7* 8.6 7.7  HGB 13.8 13.0 12.1*  HCT 42.3 40.3 38.2*  PLT 292 265 282  MCV 89.8 92.6 91.0  MCH 29.3 29.9 28.8  MCHC 32.6 32.3 31.7  RDW 14.5 14.6 14.3  LYMPHSABS 1.5  --   --   MONOABS 1.3*  --   --   EOSABS 0.0  --   --   BASOSABS 0.1  --   --      Recent Labs  Lab 08/12/22 1259 08/12/22 1437 08/13/22 0400 08/14/22 0430  NA 133*  --  140 138  K 4.3  --  4.3 4.1  CL 96*  --  100 100  CO2 25  --  30 26  GLUCOSE 142*  --  112* 107*  BUN 23  --  25* 15  CREATININE 1.27*  --  1.05 0.97  CALCIUM 9.1  --  9.2 8.7*  AST 250*  --  161* 71*  ALT 227*  --  177* 119*  ALKPHOS  248*  --  220* 209*  BILITOT 7.5*  --  4.7* 2.8*  ALBUMIN 3.7  --  3.2* 3.1*  MG  --   --  2.2 1.8  INR  --  3.0* 2.9* 2.4*  HGBA1C  --   --  5.7*  --      ------------------------------------------------------------------------------------------------------------------ No results for input(s): "CHOL", "HDL", "LDLCALC", "TRIG", "CHOLHDL", "LDLDIRECT" in the last 72 hours.  Lab Results  Component Value Date   HGBA1C 5.7 (  H) 08/13/2022   ------------------------------------------------------------------------------------------------------------------ No results for input(s): "TSH", "T4TOTAL", "T3FREE", "THYROIDAB" in the last 72 hours.  Invalid input(s): "FREET3"  Cardiac Enzymes No results for input(s): "CKMB", "TROPONINI", "MYOGLOBIN" in the last 168 hours.  Invalid input(s): "CK" ------------------------------------------------------------------------------------------------------------------ No results found for: "BNP"  CBG: Recent Labs  Lab 08/13/22 1617 08/13/22 2042 08/13/22 2325 08/14/22 0337 08/14/22 0755  GLUCAP 129* 121* 99 121* 119*     No results found for this or any previous visit (from the past 240 hour(s)).   Radiology Studies: No results found.   Pamella Pert, MD, PhD [facility]  Between 7 am - 7 pm I am available, please contact me via Amion (for emergencies) or Securechat (non urgent messages)  Between 7 pm - 7 am I am not available, please contact night coverage MD/APP via Amion

## 2022-08-15 DIAGNOSIS — K8050 Calculus of bile duct without cholangitis or cholecystitis without obstruction: Secondary | ICD-10-CM | POA: Diagnosis not present

## 2022-08-15 LAB — PROTIME-INR
INR: 2.1 — ABNORMAL HIGH (ref 0.8–1.2)
Prothrombin Time: 23.3 seconds — ABNORMAL HIGH (ref 11.4–15.2)

## 2022-08-15 LAB — CBC
HCT: 38 % — ABNORMAL LOW (ref 39.0–52.0)
Hemoglobin: 12.4 g/dL — ABNORMAL LOW (ref 13.0–17.0)
MCH: 29.2 pg (ref 26.0–34.0)
MCHC: 32.6 g/dL (ref 30.0–36.0)
MCV: 89.4 fL (ref 80.0–100.0)
Platelets: 313 10*3/uL (ref 150–400)
RBC: 4.25 MIL/uL (ref 4.22–5.81)
RDW: 14.5 % (ref 11.5–15.5)
WBC: 10.3 10*3/uL (ref 4.0–10.5)
nRBC: 0 % (ref 0.0–0.2)

## 2022-08-15 LAB — COMPREHENSIVE METABOLIC PANEL
ALT: 87 U/L — ABNORMAL HIGH (ref 0–44)
AST: 39 U/L (ref 15–41)
Albumin: 3.3 g/dL — ABNORMAL LOW (ref 3.5–5.0)
Alkaline Phosphatase: 195 U/L — ABNORMAL HIGH (ref 38–126)
Anion gap: 13 (ref 5–15)
BUN: 11 mg/dL (ref 8–23)
CO2: 25 mmol/L (ref 22–32)
Calcium: 8.6 mg/dL — ABNORMAL LOW (ref 8.9–10.3)
Chloride: 97 mmol/L — ABNORMAL LOW (ref 98–111)
Creatinine, Ser: 0.97 mg/dL (ref 0.61–1.24)
GFR, Estimated: >60 mL/min (ref >60)
Glucose, Bld: 131 mg/dL — ABNORMAL HIGH (ref 70–99)
Potassium: 4 mmol/L (ref 3.5–5.1)
Sodium: 135 mmol/L (ref 135–145)
Total Bilirubin: 2.3 mg/dL — ABNORMAL HIGH (ref 0.3–1.2)
Total Protein: 6.6 g/dL (ref 6.5–8.1)

## 2022-08-15 LAB — GLUCOSE, CAPILLARY
Glucose-Capillary: 120 mg/dL — ABNORMAL HIGH (ref 70–99)
Glucose-Capillary: 128 mg/dL — ABNORMAL HIGH (ref 70–99)
Glucose-Capillary: 134 mg/dL — ABNORMAL HIGH (ref 70–99)
Glucose-Capillary: 148 mg/dL — ABNORMAL HIGH (ref 70–99)
Glucose-Capillary: 148 mg/dL — ABNORMAL HIGH (ref 70–99)
Glucose-Capillary: 239 mg/dL — ABNORMAL HIGH (ref 70–99)

## 2022-08-15 LAB — HEPARIN LEVEL (UNFRACTIONATED)
Heparin Unfractionated: 0.19 IU/mL — ABNORMAL LOW (ref 0.30–0.70)
Heparin Unfractionated: 0.41 IU/mL (ref 0.30–0.70)
Heparin Unfractionated: <0.1 IU/mL — ABNORMAL LOW (ref 0.30–0.70)

## 2022-08-15 LAB — MAGNESIUM: Magnesium: 1.9 mg/dL (ref 1.7–2.4)

## 2022-08-15 MED ORDER — ORAL CARE MOUTH RINSE: 15.0000 mL | OROMUCOSAL | Status: DC | PRN

## 2022-08-15 MED ORDER — HEPARIN BOLUS VIA INFUSION
2000.0000 [IU] | Freq: Once | INTRAVENOUS | Status: AC
  Administered 2022-08-15: 2000 [IU] via INTRAVENOUS
  Filled 2022-08-15: qty 2000

## 2022-08-15 MED ORDER — METHYLPREDNISOLONE SODIUM SUCC 125 MG IJ SOLR
125.0000 mg | Freq: Once | INTRAMUSCULAR | Status: AC
  Administered 2022-08-15: 125 mg via INTRAVENOUS
  Filled 2022-08-15: qty 2

## 2022-08-15 NOTE — H&P (View-Only) (Signed)
[facility] Gastroenterology Progress Note  Luis Natal, PhD 72 y.o. February 24, 1951  CC: Choledocholithiasis   Subjective: Patient seen and examined at bedside.  Resting in the bed.  Denies any acute GI issues.  Complaining of significant pain in the foot because of gout flare.  ROS : Afebrile, negative for chest pain   Objective: Vital signs in last 24 hours: Vitals:   08/14/22 2035 08/15/22 0412  BP: (!) 158/96 132/83  Pulse: 94 90  Resp: 18 (!) 21  Temp: 99.9 F (37.7 C) 99.6 F (37.6 C)  SpO2: 97% 90%    Physical Exam: Sitting comfortably in the chair, not in acute distress.  Abdominal exam benign.  Alert and oriented x 3.  Mood and affect normal.  Lab Results: Recent Labs    08/13/22 0400 08/14/22 0430 08/15/22 0419  NA 140 138 135  K 4.3 4.1 4.0  CL 100 100 97*  CO2 30 26 25   GLUCOSE 112* 107* 131*  BUN 25* 15 11  CREATININE 1.05 0.97 0.97  CALCIUM 9.2 8.7* 8.6*  MG 2.2 1.8 1.9  PHOS 2.5  --   --    Recent Labs    08/14/22 0430 08/15/22 0419  AST 71* 39  ALT 119* 87*  ALKPHOS 209* 195*  BILITOT 2.8* 2.3*  PROT 6.3* 6.6  ALBUMIN 3.1* 3.3*   Recent Labs    08/12/22 1259 08/13/22 0400 08/14/22 0430 08/15/22 0419  WBC 12.7*   < > 7.7 10.3  NEUTROABS 9.8*  --   --   --   HGB 13.8   < > 12.1* 12.4*  HCT 42.3   < > 38.2* 38.0*  MCV 89.8   < > 91.0 89.4  PLT 292   < > 282 313   < > = values in this interval not displayed.   Recent Labs    08/14/22 0430 08/15/22 0419  LABPROT 26.0* 23.3*  INR 2.4* 2.1*      Assessment/Plan: -Abdominal pain and elevated LFTs.  Imaging findings showed choledocholithiasis and gallstones. -Mechanical mitral valve replacement.  Was on Coumadin.  INR 2.1 today.  On heparin drip now.  Recommendations ------------------------ -Discussed with Dr. Lovina Reach was covering ERCP services this weekend.  According to him, INR needs to be less than 1.7 preferably for an ERCP with sphincterotomy. -No plan for ERCP today  as INR remains elevated.  Tentative plan for ERCP in the next few days depending on INR. -Okay to use Solu-Medrol for gout from GI standpoint -Continue other supportive care.  Repeat INR in the morning.  GI will follow. -Discussed with Dr. Concha Se as well as hospitalist via epic chat.  Addendum 11:39  -Case discussed with Dr. Marca Ancona as well as Dr. Ewing Schlein.  Tentative plan for ERCP tomorrow afternoon at 1230 with Dr. Marca Ancona.  Need to hold heparin drip for 4 hours prior to ERCP.  Keep n.p.o. past midnight.   Kathi Der MD, FACP 08/15/2022, 8:42 AM  Contact #  [phone]

## 2022-08-15 NOTE — Progress Notes (Signed)
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)  Allergies  Allergen Reactions   Erythromycin Other (See Comments)    GI Intolerance   Sulfamethoxazole Other (See Comments)    GI Intolerance   Vytorin [Ezetimibe-Simvastatin] Other (See Comments)    LEG CRAMPS    Patient Measurements: Height: 6\' 1"  (185.4 cm) Weight: 82.4 kg (181 lb 10.5 oz) IBW/kg (Calculated) : 79.9 Heparin Dosing Weight: total body weight  Vital Signs: Temp: 99.9 F (37.7 C) (04/27 2035) Temp Source: Oral (04/27 2035) BP: 158/96 (04/27 2035) Pulse Rate: 94 (04/27 2035)  Labs: Recent Labs    08/12/22 1259 08/12/22 1437 08/13/22 0400 08/14/22 0430 08/14/22 1433 08/14/22 2359  HGB 13.8  --  13.0 12.1*  --   --   HCT 42.3  --  40.3 38.2*  --   --   PLT 292  --  265 282  --   --   APTT  --  54*  --   --  87*  --   LABPROT  --  31.0* 30.0* 26.0*  --   --   INR  --  3.0* 2.9* 2.4*  --   --   HEPARINUNFRC  --   --   --   --  <0.10* <0.10*  CREATININE 1.27*  --  1.05 0.97  --   --      Estimated Creatinine Clearance: 77.8 mL/min (by C-G formula based on SCr of 0.97 mg/dL).   Medical History: Past Medical History:  Diagnosis Date   ALLERGIC RHINITIS    Diabetes mellitus without complication (HCC)    H/O mitral valve replacement    10 yrs ago   HYPERLIPIDEMIA    Hypertension    Long term current use of anticoagulant    MITRAL VALVE PROLAPSE    PREMATURE VENTRICULAR CONTRACTIONS     Medications:  PTA Warfarin 5mg  PO daily-last dose 08/11/22 at 2115 INR goal 2.5-3.5 per Anticoag Clinic notes  Assessment: 47 y/oM with PMH of mechanical MVR on warfarin PTA who presents with right upper quadrant abdominal pain after eating. [place]: Cholelithiasis with moderate gallbladder wall thickening. No pericholecystic fluid seen. In the correct clinical settings this may represent acute cholecystitis. Dilation of the common bile duct to 1.6 cm,  concerning for choledocholithiasis or other source of downstream biliary ductal obstruction.  Warfarin held on admission in case surgical intervention required. Pharmacy consulted for heparin dosing. INR = 3. Hgb 13.8, Plt 292.    08/15/2022: INR 2.4 Heparin level remains undetectable on IV heparin 1300 units/hr CBC: Hg slightly low/stable since admission; pltc 282 Noted plans for ERCP once INR<2; possible cholecystectomy to follow  Goal of Therapy:  Heparin level 0.3-0.7 units/ml Monitor platelets by anticoagulation protocol: Yes   Plan:  Increase IV heparin to 1600 units/hr Check heparin level in 8 hours  Daily CBC, INR F/U plans for procedures/need to hold heparin once INR < 2  Junita Push, PharmD, BCPS Clinical Pharmacist 08/15/2022 12:23 AM

## 2022-08-15 NOTE — Progress Notes (Signed)
PROGRESS NOTE  [name], PhD WJX:[id number] DOB: 1950/12/18 DOA: 08/12/2022 PCP: [name], MD   LOS: 3 days   Brief Narrative / Interim history: 72 year old male with history of MVP status post mitral valve replacement, mechanical aortic valve replacement, HTN, DM2, HLD who comes into the hospital with postprandial right upper quadrant abdominal pain for several days, associated with chills, nausea.  Also reports a 6 pound unintentional weight loss, possibly related to semaglutide.  He was seen by his PCP, who found patient had elevated LFTs and he was sent to the hospital.  Imaging with concern for cholelithiasis as well as choledocholithiasis.  GI and surgery were consulted.  He was placed on antibiotics and admitted to the hospital  Subjective / 24h Interval events: No nausea, no vomiting, no abdominal pain.  His gout seems to be getting worse and has difficulties walking  Assesement and Plan: Principal problem Cholecystitis, choledocholithiasis -evidenced on the MRI/MRCP obtained on admission.  General surgery as well as gastroenterology consulted.  Likely needs ERCP followed by cholecystectomy, timing per GI and surgery given elevated INR in the setting of Coumadin use. -INR 2.1 today  Active problems Mitral and aortic valve replacements -on Coumadin, goal INR 2.5-3.5.  INR this morning 2.1.  Discussed with GI, will allow INR to drift down without vitamin K intervention for now.  If is less than 2 L potentially do ERCP tomorrow -On heparin drip since INR less than 2.5.  Gout, bilateral great toes -started on colchicine as well as allopurinol yesterday, Paxlovid more swollen and red today.  Discussed with GI, okay with steroids.  Will do IV Solu-Medrol x 1  Elevated LFTs -due to #1, left continue to improve  DM2 - continue SSI. Hold home agents  Lab Results  Component Value Date   HGBA1C 5.7 (H) 08/13/2022    Mild AKI -due to dehydration, creatinine  normalized  Hyperlipidemia -hold home regimen due to elevated LFTs  Hypertension, diastolic CHF -continue home regimen  Scheduled Meds:  allopurinol  300 mg Oral Daily   colchicine  0.6 mg Oral Daily   insulin aspart  0-9 Units Subcutaneous Q4H   lisinopril  40 mg Oral Daily   methylPREDNISolone (SOLU-MEDROL) injection  125 mg Intravenous Once   nebivolol  2.5 mg Oral Daily   pantoprazole  40 mg Oral Daily   Continuous Infusions:  ampicillin-sulbactam (UNASYN) IV 3 g (08/15/22 0253)   heparin 1,600 Units/hr (08/15/22 0249)   PRN Meds:.HYDROmorphone (DILAUDID) injection, melatonin, mouth rinse, oxyCODONE, polyethylene glycol, prochlorperazine  Current Outpatient Medications  Medication Instructions   AFRIN NASAL SPRAY 0.05 % nasal spray 1 spray, Each Nare, 2 times daily PRN   allopurinol (ZYLOPRIM) 300 mg, Oral, Daily   amoxicillin (AMOXIL) 2,000 mg, Oral, See admin instructions, Take 2,000 mg by mouth one hour prior to dental procedures   colchicine 0.6 mg, Oral, See admin instructions, Take 0.6 mg by mouth once a day as directed for gout flares   icosapent Ethyl (VASCEPA) 2 g, Oral, 2 times daily   lisinopril (ZESTRIL) 40 mg, Oral, Daily, Reported on 07/14/2015   metFORMIN (GLUCOPHAGE) 500 mg, Oral, 2 times daily   montelukast (SINGULAIR) 10 mg, Oral, Every morning   Mucinex 600 mg, Oral, Every morning   nebivolol (BYSTOLIC) 5 mg, Daily   Nexletol 180 mg, Oral, Daily   omeprazole (PRILOSEC) 40 MG capsule TAKE 1 CAPSULE(40 MG) BY MOUTH DAILY   OVER THE COUNTER MEDICATION 1-2 tablets, Oral, See admin instructions, TYLENOL Sinus +  Headache Non-Drowsy Daytime Caplets for Nasal Congestion, Sinus Pressure & Pain Relief- Take 1-2 caplets by mouth every six hours as needed for migraines   Psyllium (METAMUCIL PO) 4 capsules, Oral, Daily with breakfast   Rybelsus 14 mg, Daily   sucralfate (CARAFATE) 1 g, Oral, 4 times daily   warfarin (COUMADIN) 5 MG tablet TAKE 1 TABLET BY MOUTH EVERY  DAY OR AS DIRECTED    Diet Orders (From admission, onward)     Start     Ordered   08/16/22 0001  Diet NPO time specified  Diet effective midnight        08/15/22 0924   08/15/22 0924  Diet clear liquid Room service appropriate? Yes; Fluid consistency: Thin  Diet effective now       Question Answer Comment  Room service appropriate? Yes   Fluid consistency: Thin      08/15/22 0924            DVT prophylaxis:    Lab Results  Component Value Date   PLT 313 08/15/2022      Code Status: Full Code  Family Communication: no family at bedside   Status is: Inpatient Remains inpatient appropriate because: severity of illness   Level of care: Progressive  Consultants:  GI General surgery   Objective: Vitals:   08/14/22 1547 08/14/22 2035 08/15/22 0412 08/15/22 0500  BP:  (!) 158/96 132/83   Pulse:  94 90   Resp: 16 18 (!) 21   Temp:  99.9 F (37.7 C) 99.6 F (37.6 C)   TempSrc:  Oral Oral   SpO2:  97% 90%   Weight:    80.3 kg  Height:        Intake/Output Summary (Last 24 hours) at 08/15/2022 0925 Last data filed at 08/15/2022 0305 Gross per 24 hour  Intake 1222.05 ml  Output --  Net 1222.05 ml    Wt Readings from Last 3 Encounters:  08/15/22 80.3 kg  04/08/22 92.6 kg  02/22/22 93.3 kg    Examination:  Constitutional: NAD Eyes: lids and conjunctivae normal, no scleral icterus ENMT: mmm Neck: normal, supple Respiratory: clear to auscultation bilaterally. Normal respiratory effort.  Cardiovascular: Regular rate and rhythm, mechanical click present Abdomen: soft, no distention, no tenderness. Bowel sounds positive.   Data Reviewed: I have independently reviewed following labs and imaging studies   CBC Recent Labs  Lab 08/12/22 1259 08/13/22 0400 08/14/22 0430 08/15/22 0419  WBC 12.7* 8.6 7.7 10.3  HGB 13.8 13.0 12.1* 12.4*  HCT 42.3 40.3 38.2* 38.0*  PLT 292 265 282 313  MCV 89.8 92.6 91.0 89.4  MCH 29.3 29.9 28.8 29.2  MCHC 32.6 32.3  31.7 32.6  RDW 14.5 14.6 14.3 14.5  LYMPHSABS 1.5  --   --   --   MONOABS 1.3*  --   --   --   EOSABS 0.0  --   --   --   BASOSABS 0.1  --   --   --      Recent Labs  Lab 08/12/22 1259 08/12/22 1437 08/13/22 0400 08/14/22 0430 08/15/22 0419  NA 133*  --  140 138 135  K 4.3  --  4.3 4.1 4.0  CL 96*  --  100 100 97*  CO2 25  --  30 26 25   GLUCOSE 142*  --  112* 107* 131*  BUN 23  --  25* 15 11  CREATININE 1.27*  --  1.05 0.97 0.97  CALCIUM 9.1  --  9.2 8.7* 8.6*  AST 250*  --  161* 71* 39  ALT 227*  --  177* 119* 87*  ALKPHOS 248*  --  220* 209* 195*  BILITOT 7.5*  --  4.7* 2.8* 2.3*  ALBUMIN 3.7  --  3.2* 3.1* 3.3*  MG  --   --  2.2 1.8 1.9  INR  --  3.0* 2.9* 2.4* 2.1*  HGBA1C  --   --  5.7*  --   --      ------------------------------------------------------------------------------------------------------------------ No results for input(s): "CHOL", "HDL", "LDLCALC", "TRIG", "CHOLHDL", "LDLDIRECT" in the last 72 hours.  Lab Results  Component Value Date   HGBA1C 5.7 (H) 08/13/2022   ------------------------------------------------------------------------------------------------------------------ No results for input(s): "TSH", "T4TOTAL", "T3FREE", "THYROIDAB" in the last 72 hours.  Invalid input(s): "FREET3"  Cardiac Enzymes No results for input(s): "CKMB", "TROPONINI", "MYOGLOBIN" in the last 168 hours.  Invalid input(s): "CK" ------------------------------------------------------------------------------------------------------------------ No results found for: "BNP"  CBG: Recent Labs  Lab 08/14/22 1649 08/14/22 2032 08/14/22 2351 08/15/22 0356 08/15/22 0741  GLUCAP 92 103* 136* 134* 120*     No results found for this or any previous visit (from the past 240 hour(s)).   Radiology Studies: No results found.   Pamella Pert, MD, PhD [facility]  Between 7 am - 7 pm I am available, please contact me via Amion (for emergencies) or  Securechat (non urgent messages)  Between 7 pm - 7 am I am not available, please contact night coverage MD/APP via Amion

## 2022-08-15 NOTE — Progress Notes (Signed)
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)  Allergies  Allergen Reactions   Erythromycin Other (See Comments)    GI Intolerance   Sulfamethoxazole Other (See Comments)    GI Intolerance   Vytorin [Ezetimibe-Simvastatin] Other (See Comments)    LEG CRAMPS   Patient Measurements: Height: 6\' 1"  (185.4 cm) Weight: 80.3 kg (177 lb 0.5 oz) IBW/kg (Calculated) : 79.9 Heparin Dosing Weight: total body weight  Vital Signs: Temp: 99.2 F (37.3 C) (04/28 1307) Temp Source: Oral (04/28 1307) BP: 133/83 (04/28 1307) Pulse Rate: 89 (04/28 1307)  Labs: Recent Labs    08/13/22 0400 08/14/22 0430 08/14/22 1433 08/14/22 1433 08/14/22 2359 08/15/22 0419 08/15/22 0813 08/15/22 1803  HGB 13.0 12.1*  --   --   --  12.4*  --   --   HCT 40.3 38.2*  --   --   --  38.0*  --   --   PLT 265 282  --   --   --  313  --   --   APTT  --   --  87*  --   --   --   --   --   LABPROT 30.0* 26.0*  --   --   --  23.3*  --   --   INR 2.9* 2.4*  --   --   --  2.1*  --   --   HEPARINUNFRC  --   --  <0.10*   < > <0.10*  --  0.19* 0.41  CREATININE 1.05 0.97  --   --   --  0.97  --   --    < > = values in this interval not displayed.     Estimated Creatinine Clearance: 77.8 mL/min (by C-G formula based on SCr of 0.97 mg/dL).   Medical History: Past Medical History:  Diagnosis Date   ALLERGIC RHINITIS    Diabetes mellitus without complication (HCC)    H/O mitral valve replacement    10 yrs ago   HYPERLIPIDEMIA    Hypertension    Long term current use of anticoagulant    MITRAL VALVE PROLAPSE    PREMATURE VENTRICULAR CONTRACTIONS     Medications:  PTA Warfarin 5mg  PO daily-last dose 08/11/22 at 2115 INR goal 2.5-3.5 per Anticoag Clinic notes  Assessment: 12 y/oM with PMH of mechanical MVR on warfarin PTA who presents with right upper quadrant abdominal pain after eating. [place]: Cholelithiasis with moderate gallbladder wall  thickening. No pericholecystic fluid seen. In the correct clinical settings this may represent acute cholecystitis. Dilation of the common bile duct to 1.6 cm, concerning for choledocholithiasis or other source of downstream biliary ductal obstruction.  Warfarin held on admission in case surgical intervention required. Pharmacy consulted for heparin dosing. INR = 3. Hgb 13.8, Plt 292.    08/15/2022: INR down to 2.1 today Heparin level up to 0.41, therapeutic CBC: Hg slightly low but stable and plts wnl Noted plans for ERCP once INR <2 (preferably < 1.7); possible cholecystectomy to follow No bleeding issues per RN  Goal of Therapy:  Heparin level 0.3-0.7 units/ml Monitor platelets by anticoagulation protocol: Yes   Plan:  Continue IV heparin at 1,850 units/hr. Plan to hold heparin at 0830 tomorrow morning. Daily heparin level, CBC, INR Planning for ERCP tomorrow at 1230 if INR < 2 (prefer < 1.7)  Enzo Bi, PharmD, BCPS, BCIDP Clinical Pharmacist 08/15/2022 6:41 PM

## 2022-08-15 NOTE — Progress Notes (Addendum)
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 03/07/1951  CC: Choledocholithiasis   Subjective: Patient seen and examined at bedside.  Resting in the bed.  Denies any acute GI issues.  Complaining of significant pain in the foot because of gout flare.  ROS : Afebrile, negative for chest pain   Objective: Vital signs in last 24 hours: Vitals:   08/14/22 2035 08/15/22 0412  BP: (!) 158/96 132/83  Pulse: 94 90  Resp: 18 (!) 21  Temp: 99.9 F (37.7 C) 99.6 F (37.6 C)  SpO2: 97% 90%    Physical Exam: Sitting comfortably in the chair, not in acute distress.  Abdominal exam benign.  Alert and oriented x 3.  Mood and affect normal.  Lab Results: Recent Labs    08/13/22 0400 08/14/22 0430 08/15/22 0419  NA 140 138 135  K 4.3 4.1 4.0  CL 100 100 97*  CO2 30 26 25  GLUCOSE 112* 107* 131*  BUN 25* 15 11  CREATININE 1.05 0.97 0.97  CALCIUM 9.2 8.7* 8.6*  MG 2.2 1.8 1.9  PHOS 2.5  --   --    Recent Labs    08/14/22 0430 08/15/22 0419  AST 71* 39  ALT 119* 87*  ALKPHOS 209* 195*  BILITOT 2.8* 2.3*  PROT 6.3* 6.6  ALBUMIN 3.1* 3.3*   Recent Labs    08/12/22 1259 08/13/22 0400 08/14/22 0430 08/15/22 0419  WBC 12.7*   < > 7.7 10.3  NEUTROABS 9.8*  --   --   --   HGB 13.8   < > 12.1* 12.4*  HCT 42.3   < > 38.2* 38.0*  MCV 89.8   < > 91.0 89.4  PLT 292   < > 282 313   < > = values in this interval not displayed.   Recent Labs    08/14/22 0430 08/15/22 0419  LABPROT 26.0* 23.3*  INR 2.4* 2.1*      Assessment/Plan: -Abdominal pain and elevated LFTs.  Imaging findings showed choledocholithiasis and gallstones. -Mechanical mitral valve replacement.  Was on Coumadin.  INR 2.1 today.  On heparin drip now.  Recommendations ------------------------ -Discussed with Dr. Carr Gassner was covering ERCP services this weekend.  According to him, INR needs to be less than 1.7 preferably for an ERCP with sphincterotomy. -No plan for ERCP today  as INR remains elevated.  Tentative plan for ERCP in the next few days depending on INR. -Okay to use Solu-Medrol for gout from GI standpoint -Continue other supportive care.  Repeat INR in the morning.  GI will follow. -Discussed with Dr. Gassner as well as hospitalist via epic chat.  Addendum 11:39  -Case discussed with Dr. Karki as well as Dr. Magod.  Tentative plan for ERCP tomorrow afternoon at 1230 with Dr. Karki.  Need to hold heparin drip for 4 hours prior to ERCP.  Keep n.p.o. past midnight.   Franshesca Chipman MD, FACP 08/15/2022, 8:42 AM  Contact #  [phone]  

## 2022-08-15 NOTE — Progress Notes (Signed)
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)  Allergies  Allergen Reactions   Erythromycin Other (See Comments)    GI Intolerance   Sulfamethoxazole Other (See Comments)    GI Intolerance   Vytorin [Ezetimibe-Simvastatin] Other (See Comments)    LEG CRAMPS   Patient Measurements: Height: 6\' 1"  (185.4 cm) Weight: 80.3 kg (177 lb 0.5 oz) IBW/kg (Calculated) : 79.9 Heparin Dosing Weight: total body weight  Vital Signs: Temp: 99.6 F (37.6 C) (04/28 0412) Temp Source: Oral (04/28 0412) BP: 132/83 (04/28 0412) Pulse Rate: 90 (04/28 0412)  Labs: Recent Labs    08/12/22 1437 08/13/22 0400 08/14/22 0430 08/14/22 1433 08/14/22 2359 08/15/22 0419 08/15/22 0813  HGB  --  13.0 12.1*  --   --  12.4*  --   HCT  --  40.3 38.2*  --   --  38.0*  --   PLT  --  265 282  --   --  313  --   APTT 54*  --   --  87*  --   --   --   LABPROT 31.0* 30.0* 26.0*  --   --  23.3*  --   INR 3.0* 2.9* 2.4*  --   --  2.1*  --   HEPARINUNFRC  --   --   --  <0.10* <0.10*  --  0.19*  CREATININE  --  1.05 0.97  --   --  0.97  --      Estimated Creatinine Clearance: 77.8 mL/min (by C-G formula based on SCr of 0.97 mg/dL).   Medical History: Past Medical History:  Diagnosis Date   ALLERGIC RHINITIS    Diabetes mellitus without complication (HCC)    H/O mitral valve replacement    10 yrs ago   HYPERLIPIDEMIA    Hypertension    Long term current use of anticoagulant    MITRAL VALVE PROLAPSE    PREMATURE VENTRICULAR CONTRACTIONS     Medications:  PTA Warfarin 5mg  PO daily-last dose 08/11/22 at 2115 INR goal 2.5-3.5 per Anticoag Clinic notes  Assessment: 89 y/oM with PMH of mechanical MVR on warfarin PTA who presents with right upper quadrant abdominal pain after eating. [place]: Cholelithiasis with moderate gallbladder wall thickening. No pericholecystic fluid seen. In the correct clinical settings this may represent acute  cholecystitis. Dilation of the common bile duct to 1.6 cm, concerning for choledocholithiasis or other source of downstream biliary ductal obstruction.  Warfarin held on admission in case surgical intervention required. Pharmacy consulted for heparin dosing. INR = 3. Hgb 13.8, Plt 292.    08/15/2022: INR down to 2.1 today Heparin level up to 0.19 but remains subtherapeutic CBC: Hg slightly low but stable and plts wnl Noted plans for ERCP once INR <2 (preferably < 1.7); possible cholecystectomy to follow No bleeding issues per RN  Goal of Therapy:  Heparin level 0.3-0.7 units/ml Monitor platelets by anticoagulation protocol: Yes   Plan:  Will give small 2,000 unit heparin bolus from bag now that INR is subtherapeutic for his mechanical valve to help get heparin therapeutic Increase IV heparin to 1,850 units/hr Check heparin level in 8 hours  Daily CBC, INR INR should fall below 2 by tomorrow and hopefully < 1.7 for ERCP  Enzo Bi, PharmD, BCPS, BCIDP Clinical Pharmacist 08/15/2022 9:18 AM

## 2022-08-16 ENCOUNTER — Encounter (HOSPITAL_COMMUNITY)
Admission: EM | Disposition: A | Discharge status: Home / Self Care | Payer: Self-pay | Source: Home / Self Care | Attending: Internal Medicine

## 2022-08-16 ENCOUNTER — Inpatient Hospital Stay (HOSPITAL_COMMUNITY): Payer: Medicare PPO

## 2022-08-16 ENCOUNTER — Inpatient Hospital Stay (HOSPITAL_COMMUNITY): Payer: Medicare PPO | Admitting: Certified Registered Nurse Anesthetist

## 2022-08-16 ENCOUNTER — Encounter (HOSPITAL_COMMUNITY): Payer: Self-pay | Admitting: Internal Medicine

## 2022-08-16 DIAGNOSIS — Z7984 Long term (current) use of oral hypoglycemic drugs: Secondary | ICD-10-CM

## 2022-08-16 DIAGNOSIS — K8050 Calculus of bile duct without cholangitis or cholecystitis without obstruction: Secondary | ICD-10-CM | POA: Diagnosis not present

## 2022-08-16 DIAGNOSIS — E119 Type 2 diabetes mellitus without complications: Secondary | ICD-10-CM

## 2022-08-16 DIAGNOSIS — I119 Hypertensive heart disease without heart failure: Secondary | ICD-10-CM

## 2022-08-16 DIAGNOSIS — K838 Other specified diseases of biliary tract: Secondary | ICD-10-CM

## 2022-08-16 HISTORY — PX: REMOVAL OF STONES: SHX5545

## 2022-08-16 HISTORY — PX: LITHOTRIPSY: SHX5546

## 2022-08-16 HISTORY — PX: ERCP: SHX5425

## 2022-08-16 HISTORY — PX: SPHINCTEROTOMY: SHX5544

## 2022-08-16 LAB — GLUCOSE, CAPILLARY
Glucose-Capillary: 142 mg/dL — ABNORMAL HIGH (ref 70–99)
Glucose-Capillary: 138 mg/dL — ABNORMAL HIGH (ref 70–99)
Glucose-Capillary: 109 mg/dL — ABNORMAL HIGH (ref 70–99)
Glucose-Capillary: 146 mg/dL — ABNORMAL HIGH (ref 70–99)
Glucose-Capillary: 286 mg/dL — ABNORMAL HIGH (ref 70–99)
Glucose-Capillary: 98 mg/dL (ref 70–99)

## 2022-08-16 LAB — COMPREHENSIVE METABOLIC PANEL
ALT: 70 U/L — ABNORMAL HIGH (ref 0–44)
AST: 26 U/L (ref 15–41)
Albumin: 3.4 g/dL — ABNORMAL LOW (ref 3.5–5.0)
Alkaline Phosphatase: 185 U/L — ABNORMAL HIGH (ref 38–126)
Anion gap: 13 (ref 5–15)
BUN: 15 mg/dL (ref 8–23)
CO2: 26 mmol/L (ref 22–32)
Calcium: 9.6 mg/dL (ref 8.9–10.3)
Chloride: 99 mmol/L (ref 98–111)
Creatinine, Ser: 0.93 mg/dL (ref 0.61–1.24)
GFR, Estimated: >60 mL/min (ref >60)
Glucose, Bld: 162 mg/dL — ABNORMAL HIGH (ref 70–99)
Potassium: 3.7 mmol/L (ref 3.5–5.1)
Sodium: 138 mmol/L (ref 135–145)
Total Bilirubin: 2 mg/dL — ABNORMAL HIGH (ref 0.3–1.2)
Total Protein: 7.4 g/dL (ref 6.5–8.1)

## 2022-08-16 LAB — PROTIME-INR
INR: 1.9 — ABNORMAL HIGH (ref 0.8–1.2)
Prothrombin Time: 22 seconds — ABNORMAL HIGH (ref 11.4–15.2)

## 2022-08-16 LAB — CBC
HCT: 41.8 % (ref 39.0–52.0)
Hemoglobin: 12.9 g/dL — ABNORMAL LOW (ref 13.0–17.0)
MCH: 28.5 pg (ref 26.0–34.0)
MCHC: 30.9 g/dL (ref 30.0–36.0)
MCV: 92.5 fL (ref 80.0–100.0)
Platelets: 343 10*3/uL (ref 150–400)
RBC: 4.52 MIL/uL (ref 4.22–5.81)
RDW: 14.6 % (ref 11.5–15.5)
WBC: 13.2 10*3/uL — ABNORMAL HIGH (ref 4.0–10.5)
nRBC: 0 % (ref 0.0–0.2)

## 2022-08-16 LAB — HEPARIN LEVEL (UNFRACTIONATED): Heparin Unfractionated: 0.49 IU/mL (ref 0.30–0.70)

## 2022-08-16 SURGERY — ERCP, WITH INTERVENTION IF INDICATED
Anesthesia: General

## 2022-08-16 MED ORDER — LIDOCAINE 2% (20 MG/ML) 5 ML SYRINGE
INTRAMUSCULAR | Status: DC | PRN
  Administered 2022-08-16: 40 mg via INTRAVENOUS

## 2022-08-16 MED ORDER — DICLOFENAC SUPPOSITORY 100 MG
RECTAL | Status: DC | PRN
  Administered 2022-08-16: 100 mg via RECTAL

## 2022-08-16 MED ORDER — GLUCAGON HCL RDNA (DIAGNOSTIC) 1 MG IJ SOLR
INTRAMUSCULAR | Status: AC
  Filled 2022-08-16: qty 1

## 2022-08-16 MED ORDER — DEXAMETHASONE SODIUM PHOSPHATE 10 MG/ML IJ SOLN
INTRAMUSCULAR | Status: DC | PRN
  Administered 2022-08-16: 10 mg via INTRAVENOUS

## 2022-08-16 MED ORDER — ROCURONIUM BROMIDE 10 MG/ML (PF) SYRINGE
PREFILLED_SYRINGE | INTRAVENOUS | Status: DC | PRN
  Administered 2022-08-16: 60 mg via INTRAVENOUS

## 2022-08-16 MED ORDER — FENTANYL CITRATE (PF) 100 MCG/2ML IJ SOLN
INTRAMUSCULAR | Status: AC
  Filled 2022-08-16: qty 2

## 2022-08-16 MED ORDER — HEPARIN (PORCINE) 25000 UT/250ML-% IV SOLN
1850.0000 [IU]/h | INTRAVENOUS | Status: AC
  Administered 2022-08-16: 1850 [IU]/h via INTRAVENOUS
  Filled 2022-08-16: qty 250

## 2022-08-16 MED ORDER — SODIUM CHLORIDE 0.9 % IV SOLN: INTRAVENOUS | Status: DC

## 2022-08-16 MED ORDER — LACTATED RINGERS IV SOLN: INTRAVENOUS | Status: DC | PRN

## 2022-08-16 MED ORDER — DICLOFENAC SUPPOSITORY 100 MG
RECTAL | Status: AC
  Filled 2022-08-16: qty 1

## 2022-08-16 MED ORDER — PROPOFOL 10 MG/ML IV BOLUS
INTRAVENOUS | Status: DC | PRN
  Administered 2022-08-16: 150 mg via INTRAVENOUS

## 2022-08-16 MED ORDER — ONDANSETRON HCL 4 MG/2ML IJ SOLN
INTRAMUSCULAR | Status: DC | PRN
  Administered 2022-08-16: 4 mg via INTRAVENOUS

## 2022-08-16 MED ORDER — SODIUM CHLORIDE 0.9 % IV SOLN
INTRAVENOUS | Status: DC | PRN
  Administered 2022-08-16: 100 mL

## 2022-08-16 MED ORDER — SUGAMMADEX SODIUM 200 MG/2ML IV SOLN
INTRAVENOUS | Status: DC | PRN
  Administered 2022-08-16: 200 mg via INTRAVENOUS

## 2022-08-16 MED ORDER — FENTANYL CITRATE (PF) 100 MCG/2ML IJ SOLN
INTRAMUSCULAR | Status: DC | PRN
  Administered 2022-08-16 (×2): 50 ug via INTRAVENOUS

## 2022-08-16 MED ORDER — HEPARIN (PORCINE) 25000 UT/250ML-% IV SOLN: 1850.0000 [IU]/h | INTRAVENOUS | Status: DC

## 2022-08-16 NOTE — Transfer of Care (Signed)
Immediate Anesthesia Transfer of Care Note  Patient: Luis Natal, PhD  Procedure(s) Performed: ENDOSCOPIC RETROGRADE CHOLANGIOPANCREATOGRAPHY (ERCP) SPHINCTEROTOMY REMOVAL OF STONES LITHOTRIPSY  Patient Location: PACU and Endoscopy Unit  Anesthesia Type:General  Level of Consciousness: awake, alert , and oriented  Airway & Oxygen Therapy: Patient Spontanous Breathing  Post-op Assessment: Report given to RN and Post -op Vital signs reviewed and stable  Post vital signs: Reviewed and stable  Last Vitals:  Vitals Value Taken Time  BP 157/84 08/16/22 1442  Temp    Pulse 81 08/16/22 1444  Resp 19 08/16/22 1444  SpO2 97 % 08/16/22 1444  Vitals shown include unvalidated device data.  Last Pain:  Vitals:   08/16/22 1442  TempSrc:   PainSc: 0-No pain      Patients Stated Pain Goal: 1 (08/16/22 0816)  Complications: No notable events documented.

## 2022-08-16 NOTE — Anesthesia Preprocedure Evaluation (Addendum)
Anesthesia Evaluation  Patient identified by MRN, date of birth, ID band Patient awake    Reviewed: Allergy & Precautions, NPO status , Patient's Chart, lab work & pertinent test results  Airway Mallampati: I  TM Distance: >3 FB Neck ROM: Full    Dental  (+) Teeth Intact, Dental Advisory Given   Pulmonary neg pulmonary ROS   breath sounds clear to auscultation       Cardiovascular hypertension, Pt. on home beta blockers + Valvular Problems/Murmurs  Rhythm:Regular Rate:Normal  Echo: 1. Left ventricular ejection fraction, by estimation, is 60 to 65%. The  left ventricle has normal function. The left ventricle demonstrates  regional wall motion abnormalities (see scoring diagram/findings for  description). There is severe left  ventricular hypertrophy. Left ventricular diastolic parameters are  indeterminate.   2. Right ventricular systolic function is normal. The right ventricular  size is normal. There is normal pulmonary artery systolic pressure. The  estimated right ventricular systolic pressure is 31.5 mmHg.   3. The mitral valve has been repaired/replaced. Trivial mitral valve  regurgitation. The mean mitral valve gradient is 2.0 mmHg. There is a [employer]  [place]ude mechanical valve present in the mitral position. Grossly normal  function.   4. The aortic valve is tricuspid. Aortic valve regurgitation is trivial.     Neuro/Psych negative neurological ROS  negative psych ROS   GI/Hepatic Neg liver ROS,GERD  Medicated,,  Endo/Other  diabetes, Type 2, Oral Hypoglycemic Agents    Renal/GU negative Renal ROS     Musculoskeletal   Abdominal   Peds  Hematology   Anesthesia Other Findings   Reproductive/Obstetrics                             Anesthesia Physical Anesthesia Plan  ASA: 2  Anesthesia Plan: General   Post-op Pain Management: Minimal or no pain anticipated   Induction:  Intravenous  PONV Risk Score and Plan: 2 and Ondansetron and Treatment may vary due to age or medical condition  Airway Management Planned: Oral ETT  Additional Equipment: None  Intra-op Plan:   Post-operative Plan: Extubation in OR  Informed Consent: I have reviewed the patients History and Physical, chart, labs and discussed the procedure including the risks, benefits and alternatives for the proposed anesthesia with the patient or authorized representative who has indicated his/her understanding and acceptance.       Plan Discussed with: CRNA  Anesthesia Plan Comments:        Anesthesia Quick Evaluation

## 2022-08-16 NOTE — Progress Notes (Signed)
Mobility Specialist - Progress Note   08/16/22 1030  Mobility  Activity Ambulated with assistance in hallway  Level of Assistance Modified independent, requires aide device or extra time  Assistive Device Other (Comment) (IV Pole)  Distance Ambulated (ft) 350 ft  Activity Response Tolerated well  Mobility Referral Yes  $Mobility charge 1 Mobility   Pt received in recliner and agreeable to mobility. No complaints during session. Pt to EOB after session with all needs met.    [facility]

## 2022-08-16 NOTE — Anesthesia Postprocedure Evaluation (Signed)
Anesthesia Post Note  Patient: Luis Natal, PhD  Procedure(s) Performed: ENDOSCOPIC RETROGRADE CHOLANGIOPANCREATOGRAPHY (ERCP) SPHINCTEROTOMY REMOVAL OF STONES LITHOTRIPSY     Patient location during evaluation: PACU Anesthesia Type: General Level of consciousness: awake and alert Pain management: pain level controlled Vital Signs Assessment: post-procedure vital signs reviewed and stable Respiratory status: spontaneous breathing, nonlabored ventilation, respiratory function stable and patient connected to nasal cannula oxygen Cardiovascular status: blood pressure returned to baseline and stable Postop Assessment: no apparent nausea or vomiting Anesthetic complications: no  No notable events documented.  Last Vitals:  Vitals:   08/16/22 1450 08/16/22 1500  BP: (!) 119/93 (!) 142/79  Pulse: 81 82  Resp: 20 19  Temp:    SpO2: 94% 96%    Last Pain:  Vitals:   08/16/22 1500  TempSrc:   PainSc: 0-No pain                 Shelton Silvas

## 2022-08-16 NOTE — Op Note (Signed)
[facility] Patient Name: Luis Haynes Procedure Date: 08/16/2022 MRN: [id number] Attending MD: Kerin Salen , MD, [id number] Date of Birth: Aug 01, 1950 CSN: [id number] Age: 72 Admit Type: Inpatient Procedure:                ERCP Indications:              Common bile duct stones Providers:                Kerin Salen, MD, Lorenza Evangelist, RN, Beryle Beams,                            Technician, Kym Groom, CRNA Referring MD:             [facility] Medicines:                See the Anesthesia note for documentation of the                            administered medications Complications:            No immediate complications. Estimated Blood Loss:     Estimated blood loss: none. Procedure:                Pre-Anesthesia Assessment:                           - Prior to the procedure, a History and Physical                            was performed, and patient medications and                            allergies were reviewed. The patient's tolerance of                            previous anesthesia was also reviewed. The risks                            and benefits of the procedure and the sedation                            options and risks were discussed with the patient.                            All questions were answered, and informed consent                            was obtained. Prior Anticoagulants: The patient has                            taken Coumadin (warfarin), last dose was 5 days                            prior to procedure. ASA Grade Assessment: III - A  patient with severe systemic disease. After                            reviewing the risks and benefits, the patient was                            deemed in satisfactory condition to undergo the                            procedure.                           After obtaining informed consent, the scope was                            passed under direct vision.  Throughout the                            procedure, the patient's blood pressure, pulse, and                            oxygen saturations were monitored continuously. The                            [id number]-Q190V ([id number] Olympus duodenoscope was                            introduced through the mouth, and used to inject                            contrast into and used to inject contrast into the                            bile duct. The ERCP was technically difficult and                            complex due to presence of a large sludge burden                            and multiple large stones. The patient tolerated                            the procedure well. Scope In: Scope Out: Findings:      The scout film was normal. The esophagus was successfully intubated       under direct vision. The scope was advanced to a normal major papilla in       the descending duodenum without detailed examination of the pharynx,       larynx and associated structures, and upper GI tract. The upper GI tract       was grossly normal.      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      The bile duct was readily and deeply cannulated with the sphincterotome.       Contrast was injected. I personally interpreted the  bile duct images.       There was brisk flow of contrast through the ducts. Image quality was       excellent. Contrast extended to the main bile duct. Contrast extended to       the bifurcation.      The lower third of the main bile duct, middle third of the main bile       duct, upper third of the main bile duct and left main hepatic duct       contained multiple stones, the largest of which was 15 mm in diameter.      The main bile duct was markedly dilated. A straight Roadrunner wire was       passed into the biliary tree.      A 10 mm biliary sphincterotomy was made with a monofilament       sphincterotome using ERBE electrocautery, which later was extended by        another 5 mm. There was no post-sphincterotomy bleeding.      The biliary tree was swept with a 9 mm, 12mm and 15 mm balloon starting       at the bifurcation and left main hepatic duct. A large amount of thick       sludge was swept from the duct.      Multiple stones were removed.      2 large stones in the distal CBD were crushed with a 1.5 cm and 2 cm       basket, then subsequently removed.      After a prolonged effort and innumerable balloon sweeps, no further       filling defect was noted in the CBD or left main hepatic duct.      The pancreatic duct was not injected or canulated. Impression:               - The entire main bile duct was markedly dilated.                           - Choledocholithiasis was found. Complete removal                            was accomplished by biliary sphincterotomy and                            balloon extraction and basket lithotripsy.                           - A biliary sphincterotomy was performed.                           - The biliary tree was swept. Moderate Sedation:      Patient did not receive moderate sedation for this procedure, but       instead received monitored anesthesia care. Recommendation:           - Clear liquid diet.                           - Resume heparin at prior dose today in 4 hours.                           -  Cholecystectomy this admission. Procedure Code(s):        --- Professional ---                           [phone], Endoscopic retrograde                            cholangiopancreatography (ERCP); with removal of                            calculi/debris from biliary/pancreatic duct(s)                           [id number], Endoscopic retrograde                            cholangiopancreatography (ERCP); with                            sphincterotomy/papillotomy                           [phone], Endoscopic catheterization of the biliary                            ductal system, radiological supervision and                             interpretation Diagnosis Code(s):        --- Professional ---                           K80.50, Calculus of bile duct without cholangitis                            or cholecystitis without obstruction                           K83.8, Other specified diseases of biliary tract CPT copyright 2022 American [employer] Association. All rights reserved. The codes documented in this report are preliminary and upon coder review may  be revised to meet current compliance requirements. Kerin Salen, MD 08/16/2022 2:41:22 PM This report has been signed electronically. Number of Addenda: 0

## 2022-08-16 NOTE — Interval H&P Note (Signed)
History and Physical Interval Note: 71/male with CBD stones for ERCP,warfarin on hold from 08/11/22, INR was 1.9 today in am.  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  The various methods of treatment have been discussed with the patient and family. After consideration of risks, benefits and other options for treatment, the patient has consented to  Procedure(s): ENDOSCOPIC RETROGRADE CHOLANGIOPANCREATOGRAPHY (ERCP) (N/A) as a surgical intervention.  The patient's history has been reviewed, patient examined, no change in status, stable for surgery.  I have reviewed the patient's chart and labs.  Questions were answered to the patient's satisfaction.     Kerin Salen

## 2022-08-16 NOTE — Progress Notes (Signed)
PROGRESS NOTE  Luis Natal, PhD ZOX:[id number] DOB: 11/04/1950 DOA: 08/12/2022 PCP: [name], MD   LOS: 4 days   Brief Narrative / Interim history: 72 year old male with history of MVP status post mitral valve replacement, mechanical aortic valve replacement, HTN, DM2, HLD who comes into the hospital with postprandial right upper quadrant abdominal pain for several days, associated with chills, nausea.  Also reports a 6 pound unintentional weight loss, possibly related to semaglutide.  He was seen by his PCP, who found patient had elevated LFTs and he was sent to the hospital.  Imaging with concern for cholelithiasis as well as choledocholithiasis.  GI and surgery were consulted.  He was placed on antibiotics and admitted to the hospital  Subjective / 24h Interval events: No nausea, vomiting, abdominal pain.  Gout much better after receiving steroids yesterday.  He is now able to walk.  Assesement and Plan: Principal problem Cholecystitis, choledocholithiasis -evidenced on the MRI/MRCP obtained on admission.  General surgery as well as gastroenterology consulted.  INR drifted down, 1.9 this morning, will get ERCP today.  Active problems Mitral and aortic valve replacements -on Coumadin, goal INR 2.5-3.5. On heparin drip since INR less than 2.5.  Gout, bilateral great toes -started on colchicine as well as allopurinol, gave a dose of steroids 4/28.  Improving.  Elevated LFTs -due to #1, continue to improve  DM2 - continue SSI. Hold home agents  Lab Results  Component Value Date   HGBA1C 5.7 (H) 08/13/2022    Mild AKI -due to dehydration, creatinine normalized  Hyperlipidemia -hold home regimen due to elevated LFTs  Hypertension, diastolic CHF -continue home regimen  Scheduled Meds:  [MAR Hold] allopurinol  300 mg Oral Daily   [MAR Hold] colchicine  0.6 mg Oral Daily   [MAR Hold] insulin aspart  0-9 Units Subcutaneous Q4H   [MAR Hold] lisinopril  40 mg Oral Daily    [MAR Hold] nebivolol  2.5 mg Oral Daily   [MAR Hold] pantoprazole  40 mg Oral Daily   Continuous Infusions:  sodium chloride     [MAR Hold] ampicillin-sulbactam (UNASYN) IV 3 g (08/16/22 0824)   PRN Meds:.[MAR Hold]  HYDROmorphone (DILAUDID) injection, [MAR Hold] melatonin, [MAR Hold] mouth rinse, [MAR Hold] oxyCODONE, [MAR Hold] polyethylene glycol, [MAR Hold] prochlorperazine  Current Outpatient Medications  Medication Instructions   AFRIN NASAL SPRAY 0.05 % nasal spray 1 spray, Each Nare, 2 times daily PRN   allopurinol (ZYLOPRIM) 300 mg, Oral, Daily   amoxicillin (AMOXIL) 2,000 mg, Oral, See admin instructions, Take 2,000 mg by mouth one hour prior to dental procedures   colchicine 0.6 mg, Oral, See admin instructions, Take 0.6 mg by mouth once a day as directed for gout flares   icosapent Ethyl (VASCEPA) 2 g, Oral, 2 times daily   lisinopril (ZESTRIL) 40 mg, Oral, Daily, Reported on 07/14/2015   metFORMIN (GLUCOPHAGE) 500 mg, Oral, 2 times daily   montelukast (SINGULAIR) 10 mg, Oral, Every morning   Mucinex 600 mg, Oral, Every morning   nebivolol (BYSTOLIC) 5 mg, Daily   Nexletol 180 mg, Oral, Daily   omeprazole (PRILOSEC) 40 MG capsule TAKE 1 CAPSULE(40 MG) BY MOUTH DAILY   OVER THE COUNTER MEDICATION 1-2 tablets, Oral, See admin instructions, TYLENOL Sinus + Headache Non-Drowsy Daytime Caplets for Nasal Congestion, Sinus Pressure & Pain Relief- Take 1-2 caplets by mouth every six hours as needed for migraines   Psyllium (METAMUCIL PO) 4 capsules, Oral, Daily with breakfast   Rybelsus 14 mg, Daily  sucralfate (CARAFATE) 1 g, Oral, 4 times daily   warfarin (COUMADIN) 5 MG tablet TAKE 1 TABLET BY MOUTH EVERY DAY OR AS DIRECTED    Diet Orders (From admission, onward)     Start     Ordered   08/17/22 0000  Diet NPO time specified Except for: Sips with Meds  Diet effective midnight       Question:  Except for  Answer:  Sips with Meds   08/16/22 1154   08/16/22 1155  Diet NPO  time specified  Diet effective now        08/16/22 1154            DVT prophylaxis:    Lab Results  Component Value Date   PLT 343 08/16/2022      Code Status: Full Code  Family Communication: no family at bedside   Status is: Inpatient Remains inpatient appropriate because: severity of illness   Level of care: Progressive  Consultants:  GI General surgery   Objective: Vitals:   08/15/22 2004 08/16/22 0500 08/16/22 0549 08/16/22 1201  BP: (!) 153/98  (!) 138/92 (!) 145/96  Pulse: 85  84 81  Resp: 17  17 17   Temp: 98.5 F (36.9 C)  98.2 F (36.8 C) 97.8 F (36.6 C)  TempSrc: Oral  Oral Temporal  SpO2: 95%  98% 96%  Weight:  80.7 kg  80.7 kg  Height:    6\' 1"  (1.854 m)    Intake/Output Summary (Last 24 hours) at 08/16/2022 1215 Last data filed at 08/15/2022 1300 Gross per 24 hour  Intake 360 ml  Output --  Net 360 ml    Wt Readings from Last 3 Encounters:  08/16/22 80.7 kg  04/08/22 92.6 kg  02/22/22 93.3 kg    Examination:  Constitutional: NAD Eyes: lids and conjunctivae normal, no scleral icterus ENMT: mmm Neck: normal, supple Respiratory: clear to auscultation bilaterally, no wheezing, no crackles. Normal respiratory effort.  Cardiovascular: Regular rate and rhythm, mechanical clicks present Abdomen: soft, no distention, no tenderness. Bowel sounds positive.   Data Reviewed: I have independently reviewed following labs and imaging studies   CBC Recent Labs  Lab 08/12/22 1259 08/13/22 0400 08/14/22 0430 08/15/22 0419 08/16/22 0406  WBC 12.7* 8.6 7.7 10.3 13.2*  HGB 13.8 13.0 12.1* 12.4* 12.9*  HCT 42.3 40.3 38.2* 38.0* 41.8  PLT 292 265 282 313 343  MCV 89.8 92.6 91.0 89.4 92.5  MCH 29.3 29.9 28.8 29.2 28.5  MCHC 32.6 32.3 31.7 32.6 30.9  RDW 14.5 14.6 14.3 14.5 14.6  LYMPHSABS 1.5  --   --   --   --   MONOABS 1.3*  --   --   --   --   EOSABS 0.0  --   --   --   --   BASOSABS 0.1  --   --   --   --      Recent Labs  Lab  08/12/22 1259 08/12/22 1437 08/13/22 0400 08/14/22 0430 08/15/22 0419 08/16/22 0406  NA 133*  --  140 138 135 138  K 4.3  --  4.3 4.1 4.0 3.7  CL 96*  --  100 100 97* 99  CO2 25  --  30 26 25 26   GLUCOSE 142*  --  112* 107* 131* 162*  BUN 23  --  25* 15 11 15   CREATININE 1.27*  --  1.05 0.97 0.97 0.93  CALCIUM 9.1  --  9.2 8.7* 8.6* 9.6  AST 250*  --  161* 71* 39 26  ALT 227*  --  177* 119* 87* 70*  ALKPHOS 248*  --  220* 209* 195* 185*  BILITOT 7.5*  --  4.7* 2.8* 2.3* 2.0*  ALBUMIN 3.7  --  3.2* 3.1* 3.3* 3.4*  MG  --   --  2.2 1.8 1.9  --   INR  --  3.0* 2.9* 2.4* 2.1* 1.9*  HGBA1C  --   --  5.7*  --   --   --      ------------------------------------------------------------------------------------------------------------------ No results for input(s): "CHOL", "HDL", "LDLCALC", "TRIG", "CHOLHDL", "LDLDIRECT" in the last 72 hours.  Lab Results  Component Value Date   HGBA1C 5.7 (H) 08/13/2022   ------------------------------------------------------------------------------------------------------------------ No results for input(s): "TSH", "T4TOTAL", "T3FREE", "THYROIDAB" in the last 72 hours.  Invalid input(s): "FREET3"  Cardiac Enzymes No results for input(s): "CKMB", "TROPONINI", "MYOGLOBIN" in the last 168 hours.  Invalid input(s): "CK" ------------------------------------------------------------------------------------------------------------------ No results found for: "BNP"  CBG: Recent Labs  Lab 08/15/22 2002 08/15/22 2344 08/16/22 0404 08/16/22 0746 08/16/22 1133  GLUCAP 239* 148* 142* 138* 109*     No results found for this or any previous visit (from the past 240 hour(s)).   Radiology Studies: No results found.   Pamella Pert, MD, PhD [facility]  Between 7 am - 7 pm I am available, please contact me via Amion (for emergencies) or Securechat (non urgent messages)  Between 7 pm - 7 am I am not available, please contact night  coverage MD/APP via Amion

## 2022-08-16 NOTE — Anesthesia Procedure Notes (Signed)
Procedure Name: Intubation Date/Time: 08/16/2022 1:02 PM  Performed by: Wynonia Sours, CRNAPre-anesthesia Checklist: Patient identified, Emergency Drugs available, Suction available, Patient being monitored and Timeout performed Patient Re-evaluated:Patient Re-evaluated prior to induction Oxygen Delivery Method: Circle system utilized Preoxygenation: Pre-oxygenation with 100% oxygen Induction Type: IV induction Ventilation: Mask ventilation without difficulty Laryngoscope Size: Mac and 4 Grade View: Grade I Tube type: Oral Tube size: 7.5 mm Number of attempts: 1 Airway Equipment and Method: Stylet Placement Confirmation: ETT inserted through vocal cords under direct vision, positive ETCO2, CO2 detector and breath sounds checked- equal and bilateral Secured at: 23 cm Tube secured with: Tape Dental Injury: Teeth and Oropharynx as per pre-operative assessment

## 2022-08-16 NOTE — Progress Notes (Signed)
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)  Allergies  Allergen Reactions   Erythromycin Other (See Comments)    GI Intolerance   Sulfamethoxazole Other (See Comments)    GI Intolerance   Vytorin [Ezetimibe-Simvastatin] Other (See Comments)    LEG CRAMPS   Patient Measurements: Height: 6\' 1"  (185.4 cm) Weight: 80.7 kg (177 lb 14.6 oz) IBW/kg (Calculated) : 79.9 Heparin Dosing Weight: total body weight  Vital Signs: Temp: 97.1 F (36.2 C) (04/29 1442) Temp Source: Temporal (04/29 1442) BP: 119/93 (04/29 1450) Pulse Rate: 81 (04/29 1450)  Labs: Recent Labs    08/14/22 0430 08/14/22 1433 08/14/22 2359 08/15/22 0419 08/15/22 0813 08/15/22 1803 08/16/22 0406  HGB 12.1*  --   --  12.4*  --   --  12.9*  HCT 38.2*  --   --  38.0*  --   --  41.8  PLT 282  --   --  313  --   --  343  APTT  --  87*  --   --   --   --   --   LABPROT 26.0*  --   --  23.3*  --   --  22.0*  INR 2.4*  --   --  2.1*  --   --  1.9*  HEPARINUNFRC  --  <0.10*   < >  --  0.19* 0.41 0.49  CREATININE 0.97  --   --  0.97  --   --  0.93   < > = values in this interval not displayed.     Estimated Creatinine Clearance: 81.1 mL/min (by C-G formula based on SCr of 0.93 mg/dL).   Medical History: Past Medical History:  Diagnosis Date   ALLERGIC RHINITIS    Diabetes mellitus without complication (HCC)    H/O mitral valve replacement    10 yrs ago   HYPERLIPIDEMIA    Hypertension    Long term current use of anticoagulant    MITRAL VALVE PROLAPSE    PREMATURE VENTRICULAR CONTRACTIONS     Medications:  PTA Warfarin 5mg  PO daily-last dose 08/11/22 at 2115 INR goal 2.5-3.5 per Anticoag Clinic notes  Assessment: 32 y/oM with PMH of mechanical MVR on warfarin PTA who presents with right upper quadrant abdominal pain after eating. [place]: Cholelithiasis with moderate gallbladder wall thickening. No pericholecystic fluid seen. In the correct  clinical settings this may represent acute cholecystitis. Dilation of the common bile duct to 1.6 cm, concerning for choledocholithiasis or other source of downstream biliary ductal obstruction.  Warfarin held on admission in case surgical intervention required. Pharmacy consulted for heparin dosing. INR = 3. Hgb 13.8, Plt 292 on admission.   08/16/2022: INR down to 1.9 today Heparin level this AM = 0.49 units/mL, therapeutic CBC: Hgb low/stable at 12.9, Pltc WNL.  IV heparin held at 0630 this AM per GI for ERCP Post-ERCP, GI recommends resuming heparin at previous rate in 4 hours. Per [facility], surgery planning for cholecystectomy tomorrow and recommends no heparin after midnight.   Goal of Therapy:  Heparin level 0.3-0.7 units/ml Monitor platelets by anticoagulation protocol: Yes   Plan:  Per discussion with Dr. Elvera Lennox, resume IV heparin at 1850 units/hr (no bolus) from 7pm-midnight only. Will follow up after surgery tomorrow on when safe to resume anticoagulation thereafter.  Monitor closely for s/sx of bleeding.  Greer Pickerel, PharmD, BCPS Clinical Pharmacist 08/16/2022 3:02 PM

## 2022-08-17 ENCOUNTER — Encounter (HOSPITAL_COMMUNITY): Payer: Self-pay | Admitting: Internal Medicine

## 2022-08-17 ENCOUNTER — Inpatient Hospital Stay (HOSPITAL_COMMUNITY): Payer: Medicare PPO | Admitting: Certified Registered Nurse Anesthetist

## 2022-08-17 ENCOUNTER — Other Ambulatory Visit: Payer: Self-pay

## 2022-08-17 ENCOUNTER — Encounter (HOSPITAL_COMMUNITY)
Admission: EM | Disposition: A | Discharge status: Home / Self Care | Payer: Self-pay | Source: Home / Self Care | Attending: Internal Medicine

## 2022-08-17 DIAGNOSIS — K8050 Calculus of bile duct without cholangitis or cholecystitis without obstruction: Secondary | ICD-10-CM | POA: Diagnosis not present

## 2022-08-17 DIAGNOSIS — K819 Cholecystitis, unspecified: Secondary | ICD-10-CM

## 2022-08-17 DIAGNOSIS — Z7984 Long term (current) use of oral hypoglycemic drugs: Secondary | ICD-10-CM

## 2022-08-17 DIAGNOSIS — E119 Type 2 diabetes mellitus without complications: Secondary | ICD-10-CM

## 2022-08-17 DIAGNOSIS — I10 Essential (primary) hypertension: Secondary | ICD-10-CM

## 2022-08-17 HISTORY — PX: CHOLECYSTECTOMY: SHX55

## 2022-08-17 LAB — GLUCOSE, CAPILLARY
Glucose-Capillary: 128 mg/dL — ABNORMAL HIGH (ref 70–99)
Glucose-Capillary: 107 mg/dL — ABNORMAL HIGH (ref 70–99)
Glucose-Capillary: 98 mg/dL (ref 70–99)
Glucose-Capillary: 133 mg/dL — ABNORMAL HIGH (ref 70–99)
Glucose-Capillary: 181 mg/dL — ABNORMAL HIGH (ref 70–99)

## 2022-08-17 LAB — COMPREHENSIVE METABOLIC PANEL
ALT: 54 U/L — ABNORMAL HIGH (ref 0–44)
AST: 28 U/L (ref 15–41)
Albumin: 3.1 g/dL — ABNORMAL LOW (ref 3.5–5.0)
Alkaline Phosphatase: 149 U/L — ABNORMAL HIGH (ref 38–126)
Anion gap: 11 (ref 5–15)
BUN: 23 mg/dL (ref 8–23)
CO2: 27 mmol/L (ref 22–32)
Calcium: 8.9 mg/dL (ref 8.9–10.3)
Chloride: 100 mmol/L (ref 98–111)
Creatinine, Ser: 1.03 mg/dL (ref 0.61–1.24)
GFR, Estimated: >60 mL/min (ref >60)
Glucose, Bld: 126 mg/dL — ABNORMAL HIGH (ref 70–99)
Potassium: 4.9 mmol/L (ref 3.5–5.1)
Sodium: 138 mmol/L (ref 135–145)
Total Bilirubin: 1.9 mg/dL — ABNORMAL HIGH (ref 0.3–1.2)
Total Protein: 6.6 g/dL (ref 6.5–8.1)

## 2022-08-17 LAB — CBC
HCT: 38.8 % — ABNORMAL LOW (ref 39.0–52.0)
Hemoglobin: 12.3 g/dL — ABNORMAL LOW (ref 13.0–17.0)
MCH: 29.4 pg (ref 26.0–34.0)
MCHC: 31.7 g/dL (ref 30.0–36.0)
MCV: 92.6 fL (ref 80.0–100.0)
Platelets: 368 10*3/uL (ref 150–400)
RBC: 4.19 MIL/uL — ABNORMAL LOW (ref 4.22–5.81)
RDW: 14.6 % (ref 11.5–15.5)
WBC: 14 10*3/uL — ABNORMAL HIGH (ref 4.0–10.5)
nRBC: 0 % (ref 0.0–0.2)

## 2022-08-17 LAB — PROTIME-INR
INR: 1.5 — ABNORMAL HIGH (ref 0.8–1.2)
Prothrombin Time: 18.2 seconds — ABNORMAL HIGH (ref 11.4–15.2)

## 2022-08-17 LAB — MAGNESIUM: Magnesium: 2.2 mg/dL (ref 1.7–2.4)

## 2022-08-17 SURGERY — LAPAROSCOPIC CHOLECYSTECTOMY
Anesthesia: General

## 2022-08-17 MED ORDER — LIDOCAINE HCL (PF) 2 % IJ SOLN
INTRAMUSCULAR | Status: AC
  Filled 2022-08-17: qty 5

## 2022-08-17 MED ORDER — ROCURONIUM BROMIDE 10 MG/ML (PF) SYRINGE
PREFILLED_SYRINGE | INTRAVENOUS | Status: AC
  Filled 2022-08-17: qty 10

## 2022-08-17 MED ORDER — HEPARIN (PORCINE) 25000 UT/250ML-% IV SOLN
1850.0000 [IU]/h | INTRAVENOUS | Status: AC
  Administered 2022-08-17 – 2022-08-18 (×2): 1850 [IU]/h via INTRAVENOUS
  Filled 2022-08-17 (×2): qty 250

## 2022-08-17 MED ORDER — PROPOFOL 10 MG/ML IV BOLUS
INTRAVENOUS | Status: AC
  Filled 2022-08-17: qty 20

## 2022-08-17 MED ORDER — MEPERIDINE HCL 50 MG/ML IJ SOLN: 6.2500 mg | INTRAMUSCULAR | Status: DC | PRN

## 2022-08-17 MED ORDER — SUGAMMADEX SODIUM 200 MG/2ML IV SOLN
INTRAVENOUS | Status: DC | PRN
  Administered 2022-08-17: 200 mg via INTRAVENOUS

## 2022-08-17 MED ORDER — CHLORHEXIDINE GLUCONATE CLOTH 2 % EX PADS: 6.0000 | MEDICATED_PAD | Freq: Once | CUTANEOUS | Status: DC

## 2022-08-17 MED ORDER — BUPIVACAINE HCL (PF) 0.5 % IJ SOLN
INTRAMUSCULAR | Status: AC
  Filled 2022-08-17: qty 30

## 2022-08-17 MED ORDER — ACETAMINOPHEN 10 MG/ML IV SOLN
INTRAVENOUS | Status: AC
  Filled 2022-08-17: qty 100

## 2022-08-17 MED ORDER — ACETAMINOPHEN 500 MG PO TABS: 1000.0000 mg | ORAL_TABLET | Freq: Once | ORAL | Status: DC

## 2022-08-17 MED ORDER — ROCURONIUM BROMIDE 10 MG/ML (PF) SYRINGE
PREFILLED_SYRINGE | INTRAVENOUS | Status: DC | PRN
  Administered 2022-08-17: 60 mg via INTRAVENOUS

## 2022-08-17 MED ORDER — BUPIVACAINE HCL (PF) 0.5 % IJ SOLN
INTRAMUSCULAR | Status: DC | PRN
  Administered 2022-08-17: 20 mL

## 2022-08-17 MED ORDER — CEFAZOLIN SODIUM-DEXTROSE 2-4 GM/100ML-% IV SOLN
INTRAVENOUS | Status: AC
  Filled 2022-08-17: qty 100

## 2022-08-17 MED ORDER — LIDOCAINE 2% (20 MG/ML) 5 ML SYRINGE
INTRAMUSCULAR | Status: DC | PRN
  Administered 2022-08-17: 80 mg via INTRAVENOUS

## 2022-08-17 MED ORDER — DEXAMETHASONE SODIUM PHOSPHATE 10 MG/ML IJ SOLN
INTRAMUSCULAR | Status: AC
  Filled 2022-08-17: qty 1

## 2022-08-17 MED ORDER — HYDROMORPHONE HCL 1 MG/ML IJ SOLN: 0.2500 mg | INTRAMUSCULAR | Status: DC | PRN

## 2022-08-17 MED ORDER — ONDANSETRON HCL 4 MG/2ML IJ SOLN
INTRAMUSCULAR | Status: DC | PRN
  Administered 2022-08-17: 4 mg via INTRAVENOUS

## 2022-08-17 MED ORDER — 0.9 % SODIUM CHLORIDE (POUR BTL) OPTIME
TOPICAL | Status: DC | PRN
  Administered 2022-08-17: 1000 mL

## 2022-08-17 MED ORDER — DEXAMETHASONE SODIUM PHOSPHATE 10 MG/ML IJ SOLN
INTRAMUSCULAR | Status: DC | PRN
  Administered 2022-08-17: 4 mg via INTRAVENOUS

## 2022-08-17 MED ORDER — AMISULPRIDE (ANTIEMETIC) 5 MG/2ML IV SOLN: 10.0000 mg | Freq: Once | INTRAVENOUS | Status: DC | PRN

## 2022-08-17 MED ORDER — FENTANYL CITRATE (PF) 100 MCG/2ML IJ SOLN
INTRAMUSCULAR | Status: DC | PRN
  Administered 2022-08-17 (×5): 50 ug via INTRAVENOUS

## 2022-08-17 MED ORDER — ACETAMINOPHEN 10 MG/ML IV SOLN
1000.0000 mg | Freq: Once | INTRAVENOUS | Status: AC
  Administered 2022-08-17: 1000 mg via INTRAVENOUS

## 2022-08-17 MED ORDER — FENTANYL CITRATE (PF) 250 MCG/5ML IJ SOLN
INTRAMUSCULAR | Status: AC
  Filled 2022-08-17: qty 5

## 2022-08-17 MED ORDER — LACTATED RINGERS IV SOLN: INTRAVENOUS | Status: DC | PRN

## 2022-08-17 MED ORDER — CHLORHEXIDINE GLUCONATE CLOTH 2 % EX PADS
6.0000 | MEDICATED_PAD | Freq: Once | CUTANEOUS | Status: AC
  Administered 2022-08-17: 6 via TOPICAL

## 2022-08-17 MED ORDER — CEFAZOLIN SODIUM-DEXTROSE 2-4 GM/100ML-% IV SOLN: 2.0000 g | INTRAVENOUS | Status: DC

## 2022-08-17 MED ORDER — ONDANSETRON HCL 4 MG/2ML IJ SOLN
INTRAMUSCULAR | Status: AC
  Filled 2022-08-17: qty 2

## 2022-08-17 MED ORDER — ONDANSETRON HCL 4 MG/2ML IJ SOLN: 4.0000 mg | Freq: Once | INTRAMUSCULAR | Status: DC | PRN

## 2022-08-17 MED ORDER — PROPOFOL 10 MG/ML IV BOLUS
INTRAVENOUS | Status: DC | PRN
  Administered 2022-08-17: 150 mg via INTRAVENOUS
  Administered 2022-08-17: 50 mg via INTRAVENOUS

## 2022-08-17 MED ORDER — LACTATED RINGERS IR SOLN
Status: DC | PRN
  Administered 2022-08-17: 1000 mL

## 2022-08-17 SURGICAL SUPPLY — 44 items
APL PRP STRL LF DISP 70% ISPRP (MISCELLANEOUS) ×1
APPLIER CLIP ROT 10 11.4 M/L (STAPLE) ×1
APR CLP MED LRG 11.4X10 (STAPLE) ×1
BAG COUNTER SPONGE SURGICOUNT (BAG) IMPLANT
BAG SPNG CNTER NS LX DISP (BAG)
CABLE HIGH FREQUENCY MONO STRZ (ELECTRODE) ×2 IMPLANT
CHLORAPREP W/TINT 26 (MISCELLANEOUS) ×2 IMPLANT
CLIP APPLIE ROT 10 11.4 M/L (STAPLE) ×2 IMPLANT
COVER MAYO STAND STRL (DRAPES) IMPLANT
COVER SURGICAL LIGHT HANDLE (MISCELLANEOUS) ×2 IMPLANT
DRAPE C-ARM 42X120 X-RAY (DRAPES) IMPLANT
ELECT REM PT RETURN 15FT ADLT (MISCELLANEOUS) ×2 IMPLANT
GLOVE BIOGEL PI IND STRL 6 (GLOVE) IMPLANT
GLOVE BIOGEL PI MICRO STRL 5.5 (GLOVE) IMPLANT
GLOVE SURG ORTHO 8.0 STRL STRW (GLOVE) ×2 IMPLANT
GLOVE SURG SYN 7.5  E (GLOVE) ×1
GLOVE SURG SYN 7.5 E (GLOVE) ×1 IMPLANT
GLOVE SURG SYN 7.5 PF PI (GLOVE) ×2 IMPLANT
GOWN STRL REUS W/ TWL XL LVL3 (GOWN DISPOSABLE) ×4 IMPLANT
GOWN STRL REUS W/TWL XL LVL3 (GOWN DISPOSABLE) ×2
HEMOSTAT SURGICEL 4X8 (HEMOSTASIS) IMPLANT
IRRIG SUCT STRYKERFLOW 2 WTIP (MISCELLANEOUS) ×1
IRRIGATION SUCT STRKRFLW 2 WTP (MISCELLANEOUS) ×2 IMPLANT
KIT BASIN OR (CUSTOM PROCEDURE TRAY) ×2 IMPLANT
KIT TURNOVER KIT A (KITS) IMPLANT
PAD POSITIONING PINK XL (MISCELLANEOUS) IMPLANT
PENCIL ELECTRO RS 15 CORD (MISCELLANEOUS) IMPLANT
PENCIL SMOKE EVACUATOR (MISCELLANEOUS) IMPLANT
SCISSORS LAP 5X35 DISP (ENDOMECHANICALS) ×2 IMPLANT
SET CHOLANGIOGRAPH MIX (MISCELLANEOUS) ×2 IMPLANT
SET TUBE SMOKE EVAC HIGH FLOW (TUBING) ×2 IMPLANT
SLEEVE Z-THREAD 5X100MM (TROCAR) ×2 IMPLANT
SPIKE FLUID TRANSFER (MISCELLANEOUS) ×2 IMPLANT
STRIP CLOSURE SKIN 1/2X4 (GAUZE/BANDAGES/DRESSINGS) ×2 IMPLANT
SUT MNCRL AB 4-0 PS2 18 (SUTURE) IMPLANT
SUT VIC AB 4-0 PS2 27 (SUTURE) ×2 IMPLANT
SYS BAG RETRIEVAL 10MM (BASKET) ×1
SYSTEM BAG RETRIEVAL 10MM (BASKET) ×2 IMPLANT
TAPE CLOTH 4X10 WHT NS (GAUZE/BANDAGES/DRESSINGS) IMPLANT
TOWEL OR 17X26 10 PK STRL BLUE (TOWEL DISPOSABLE) ×2 IMPLANT
TRAY LAPAROSCOPIC (CUSTOM PROCEDURE TRAY) ×2 IMPLANT
TROCAR 11X100 Z THREAD (TROCAR) ×2 IMPLANT
TROCAR BALLN 12MMX100 BLUNT (TROCAR) ×2 IMPLANT
TROCAR Z-THREAD OPTICAL 5X100M (TROCAR) ×2 IMPLANT

## 2022-08-17 NOTE — Op Note (Signed)
Procedure Note  Pre-operative Diagnosis:  cholecystitis, cholelithiasis, choledocholithiasis  Post-operative Diagnosis:  same  Surgeon:  [name], MD  Assistant:  Carl Best, PA-C   Procedure:  Laparoscopic cholecystectomy  Anesthesia:  General  Estimated Blood Loss:  20 cc  Drains: none         Specimen: gallbladder to pathology  Indications:  Patient is a 72 yo male with cholecystitis, cholelithiasis, and choledocholithiasis.  Patient underwent successful ERCP with stone extraction 4/29 by Dr. Marca Ancona.  Now for cholecystectomy.  Procedure description: The patient was seen in the pre-op holding area. The risks, benefits, complications, treatment options, and expected outcomes were previously discussed with the patient. The patient agreed with the proposed plan and has signed the informed consent form.  The patient was transported to operating room #4 at the [facility]. The patient was placed in the supine position on the operating room table. Following induction of general anesthesia, the abdomen was prepped and draped in the usual aseptic fashion.  An incision was made in the skin near the umbilicus. The midline fascia was incised and the peritoneal cavity was entered and a Hasson cannula was introduced under direct vision. The cannula was secured with a 0-Vicryl pursestring suture. Pneumoperitoneum was established with carbon dioxide. Additional cannulae were introduced under direct vision along the right costal margin in the midline, mid-clavicular line, and anterior axillary line.   The gallbladder was identified and the fundus grasped and retracted cephalad. Adhesions were taken down bluntly and the electrocautery was utilized as needed, taking care not to involve any adjacent structures. The infundibulum was grasped and retracted laterally, exposing the peritoneum overlying the triangle of Calot. The peritoneum was incised and structures exposed with blunt  dissection. The cystic duct was clearly identified, bluntly dissected circumferentially, and clipped at the neck of the gallbladder and then ligated with ligaclips distally and divided. The cystic artery was identified, dissected circumferentially, ligated with ligaclips, and divided.  The gallbladder was dissected away from the gallbladder bed using the electrocautery for hemostasis. The gallbladder was completely removed from the liver and placed into an endocatch bag. The gallbladder was removed in the endocatch bag through the umbilical port site and submitted to pathology for review.  The right upper quadrant was irrigated and the gallbladder bed was inspected. Hemostasis was achieved with the electrocautery.  Cannulae were removed under direct vision and good hemostasis was noted. Pneumoperitoneum was released and the majority of the carbon dioxide evacuated. The umbilical wound was irrigated and the fascia was then closed with the pursestring suture.  Local anesthetic was infiltrated at all port sites. Skin incisions were closed with 4-0 Monocril subcuticular sutures and Dermabond was applied.  Instrument, sponge, and needle counts were correct at the conclusion of the case.  The patient was awakened from anesthesia and brought to the recovery room in stable condition.  The patient tolerated the procedure well.   [name], MD [facility] Surgery Office: [phone]

## 2022-08-17 NOTE — Anesthesia Preprocedure Evaluation (Addendum)
Anesthesia Evaluation  Patient identified by MRN, date of birth, ID band Patient awake    Reviewed: Allergy & Precautions, NPO status , Patient's Chart, lab work & pertinent test results  Airway Mallampati: I  TM Distance: >3 FB Neck ROM: Full    Dental  (+) Teeth Intact, Dental Advisory Given   Pulmonary neg pulmonary ROS   breath sounds clear to auscultation       Cardiovascular hypertension, Pt. on home beta blockers and Pt. on medications + Valvular Problems/Murmurs  Rhythm:Regular Rate:Normal  Echo:  1. Left ventricular ejection fraction, by estimation, is 60 to 65%. The left ventricle has normal function. The left ventricle demonstrates regional wall motion abnormalities (see scoring diagram/findings for description). There is severe left ventricular hypertrophy. Left ventricular diastolic parameters are indeterminate.   2. Right ventricular systolic function is normal. The right ventricular size is normal. There is normal pulmonary artery systolic pressure. The estimated right ventricular systolic pressure is 31.5 mmHg.   3. The mitral valve has been repaired/replaced. Trivial mitral valve regurgitation. The mean mitral valve gradient is 2.0 mmHg. There is a [employer]. Jude mechanical valve present in the mitral position. Grossly normal function.   4. The aortic valve is tricuspid. Aortic valve regurgitation is trivial.     Neuro/Psych negative neurological ROS  negative psych ROS   GI/Hepatic Neg liver ROS,GERD  Medicated,,  Endo/Other  diabetes, Type 2, Oral Hypoglycemic Agents    Renal/GU negative Renal ROS     Musculoskeletal   Abdominal   Peds  Hematology  (+) Blood dyscrasia, anemia   Anesthesia Other Findings   Reproductive/Obstetrics                             Anesthesia Physical Anesthesia Plan  ASA: 3  Anesthesia Plan: General   Post-op Pain Management: Minimal or no pain  anticipated   Induction: Intravenous  PONV Risk Score and Plan: 2 and Ondansetron and Treatment may vary due to age or medical condition  Airway Management Planned: Oral ETT  Additional Equipment: None  Intra-op Plan:   Post-operative Plan: Extubation in OR  Informed Consent: I have reviewed the patients History and Physical, chart, labs and discussed the procedure including the risks, benefits and alternatives for the proposed anesthesia with the patient or authorized representative who has indicated his/her understanding and acceptance.     Dental advisory given  Plan Discussed with: CRNA  Anesthesia Plan Comments:        Anesthesia Quick Evaluation

## 2022-08-17 NOTE — H&P (View-Only) (Signed)
Assessment & Plan: Cholecystitis, cholelithiasis, choledocholithiasis  Successful ERCP yesterday by Dr. Marca Haynes  Off Coumadin, heparin held - INR 1.5 this AM  For cholecystectomy today  Patient seen and examined, wife at bedside.  Discussed proceeding with lap cholecystectomy today.  The risks and benefits of the procedure have been discussed at length with the patient.  The patient understands the proposed procedure, potential alternative treatments, and the course of recovery to be expected.  All of the patient's questions have been answered at this time.  The patient wishes to proceed with surgery.        Luis Level, MD [facility] Surgery [facility] practice Office: [phone]        Chief Complaint: Cholecystitis, CBD stones  Subjective: Patient comfortable in bed, wife at bedside.  Did well with ERCP.  Objective: Vital signs in last 24 hours: Temp:  [97.1 F (36.2 C)-98.5 F (36.9 C)] 98.5 F (36.9 C) (04/30 0404) Pulse Rate:  [70-84] 70 (04/30 0404) Resp:  [17-20] 17 (04/30 0404) BP: (119-157)/(79-96) 137/85 (04/30 0404) SpO2:  [94 %-100 %] 97 % (04/30 0404) Weight:  [80.7 kg-81.6 kg] 81.6 kg (04/30 0423) Last BM Date : 08/15/22  Intake/Output from previous day: 04/29 0701 - 04/30 0700 In: 2048 [P.O.:120; I.V.:1145.6; IV Piggyback:782.4] Out: -  Intake/Output this shift: No intake/output data recorded.  Physical Exam: HEENT - sclerae clear, mucous membranes moist Neck - soft Abdomen - soft without distension, no mass Ext - no edema, non-tender Neuro - alert & oriented, no focal deficits  Lab Results:  Recent Labs    08/16/22 0406 08/17/22 0415  WBC 13.2* 14.0*  HGB 12.9* 12.3*  HCT 41.8 38.8*  PLT 343 368   BMET Recent Labs    08/16/22 0406 08/17/22 0415  NA 138 138  K 3.7 4.9  CL 99 100  CO2 26 27  GLUCOSE 162* 126*  BUN 15 23  CREATININE 0.93 1.03  CALCIUM 9.6 8.9   PT/INR Recent Labs    08/16/22 0406 08/17/22 0415   LABPROT 22.0* 18.2*  INR 1.9* 1.5*   Comprehensive Metabolic Panel:    Component Value Date/Time   NA 138 08/17/2022 0415   NA 138 08/16/2022 0406   K 4.9 08/17/2022 0415   K 3.7 08/16/2022 0406   CL 100 08/17/2022 0415   CL 99 08/16/2022 0406   CO2 27 08/17/2022 0415   CO2 26 08/16/2022 0406   BUN 23 08/17/2022 0415   BUN 15 08/16/2022 0406   CREATININE 1.03 08/17/2022 0415   CREATININE 0.93 08/16/2022 0406   GLUCOSE 126 (H) 08/17/2022 0415   GLUCOSE 162 (H) 08/16/2022 0406   CALCIUM 8.9 08/17/2022 0415   CALCIUM 9.6 08/16/2022 0406   AST 28 08/17/2022 0415   AST 26 08/16/2022 0406   ALT 54 (H) 08/17/2022 0415   ALT 70 (H) 08/16/2022 0406   ALKPHOS 149 (H) 08/17/2022 0415   ALKPHOS 185 (H) 08/16/2022 0406   BILITOT 1.9 (H) 08/17/2022 0415   BILITOT 2.0 (H) 08/16/2022 0406   PROT 6.6 08/17/2022 0415   PROT 7.4 08/16/2022 0406   ALBUMIN 3.1 (L) 08/17/2022 0415   ALBUMIN 3.4 (L) 08/16/2022 0406    Studies/Results: DG C-Arm 1-60 Min-No Report  Result Date: 08/16/2022 Fluoroscopy was utilized by the requesting physician.  No radiographic interpretation.   DG ERCP  Result Date: 08/16/2022 CLINICAL DATA:  72 year old male with a history of choledocholithiasis EXAM: ERCP TECHNIQUE: Multiple spot images obtained with the fluoroscopic  device and submitted for interpretation post-procedure. FLUOROSCOPY: Radiation Exposure Index (as provided by the fluoroscopic device): 172.8 mGy Kerma COMPARISON:  MR 08/12/2022 FINDINGS: Limited intraoperative fluoroscopic spot images during ERCP. Initial image demonstrates the endoscope projecting over the upper abdomen with a safety wire in position. Subsequently there is retrograde infusion of contrast partially opacifying the extrahepatic biliary system. Multiple geometric filling defects present. There is then deployment of a retrieval basket and a retrieval balloon. IMPRESSION: Limited images during ERCP demonstrates treatment of  choledocholithiasis. Please refer to the dictated operative report for full details of intraoperative findings and procedure. Electronically Signed   By: Gilmer Mor D.O.   On: 08/16/2022 14:57      Luis Haynes 08/17/2022  Patient ID: Luis Natal, PhD, male   DOB: Jan 30, 1951, 72 y.o.   MRN: [id number]

## 2022-08-17 NOTE — Transfer of Care (Signed)
Immediate Anesthesia Transfer of Care Note  Patient: Luis Natal, PhD  Procedure(s) Performed: LAPAROSCOPIC CHOLECYSTECTOMY, NO CHOLANGIOGRAM  Patient Location: PACU  Anesthesia Type:General  Level of Consciousness: drowsy and patient cooperative  Airway & Oxygen Therapy: Patient Spontanous Breathing and Patient connected to face mask oxygen  Post-op Assessment: Report given to RN and Post -op Vital signs reviewed and stable  Post vital signs: Reviewed and stable  Last Vitals:  Vitals Value Taken Time  BP    Temp    Pulse 71 08/17/22 1453  Resp 13 08/17/22 1453  SpO2 97 % 08/17/22 1453  Vitals shown include unvalidated device data.  Last Pain:  Vitals:   08/17/22 1135  TempSrc: Oral  PainSc:       Patients Stated Pain Goal: 1 (08/16/22 0816)  Complications: No notable events documented.

## 2022-08-17 NOTE — Interval H&P Note (Signed)
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  The various methods of treatment have been discussed with the patient and family. After consideration of risks, benefits and other options for treatment, the patient has consented to    Procedure(s): LAPAROSCOPIC CHOLECYSTECTOMY, NO CHOLANGIOGRAM (N/A) as a surgical intervention.    The patient's history has been reviewed, patient examined, no change in status, stable for surgery.  I have reviewed the patient's chart and labs.  Questions were answered to the patient's satisfaction.    [name], MD [facility] Surgery [facility] practice Office: [phone]   [name]

## 2022-08-17 NOTE — Progress Notes (Signed)
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)  Allergies  Allergen Reactions   Erythromycin Other (See Comments)    GI Intolerance   Sulfamethoxazole Other (See Comments)    GI Intolerance   Vytorin [Ezetimibe-Simvastatin] Other (See Comments)    LEG CRAMPS   Patient Measurements: Height: 6\' 1"  (185.4 cm) Weight: 81.6 kg (180 lb) IBW/kg (Calculated) : 79.9 Heparin Dosing Weight: total body weight  Vital Signs: Temp: 97.6 F (36.4 C) (04/30 1451) Temp Source: Oral (04/30 1135) BP: 147/87 (04/30 1545) Pulse Rate: 71 (04/30 1545)  Labs: Recent Labs    08/15/22 0419 08/15/22 0813 08/15/22 1803 08/16/22 0406 08/17/22 0415  HGB 12.4*  --   --  12.9* 12.3*  HCT 38.0*  --   --  41.8 38.8*  PLT 313  --   --  343 368  LABPROT 23.3*  --   --  22.0* 18.2*  INR 2.1*  --   --  1.9* 1.5*  HEPARINUNFRC  --  0.19* 0.41 0.49  --   CREATININE 0.97  --   --  0.93 1.03     Estimated Creatinine Clearance: 73.3 mL/min (by C-G formula based on SCr of 1.03 mg/dL).   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 [place]: Cholelithiasis with moderate gallbladder wall thickening. No pericholecystic fluid seen. In the correct clinical settings this may represent acute cholecystitis. Dilation of the common bile duct to 1.6 cm, concerning for choledocholithiasis or other source of downstream biliary ductal obstruction.  Warfarin  held on admission in case surgical intervention required. Pharmacy consulted for heparin dosing. INR = 3. Hgb 13.8, Plt 292 on admission.   08/17/2022: INR = 1.5 today CBC: Hgb low/stable at 12.3, Pltc WNL.  IV heparin held at 0000 this AM for lap cholecystectomy Post-lap chole: surgery okayed to resume heparin gtt on 4/30 at 2100 without bolus    Goal of Therapy:  Heparin level 0.3-0.7 units/ml Monitor platelets by anticoagulation protocol: Yes   Plan:  At 2100 will restart heparin gtt without a bolus @ 1850 units/hr (previous therapeutic rate) Check heparin level 8 hr after heparin restarted Daily CBC and heparin level Monitor closely for s/sx of bleeding. F/u plans to restart of warfarin  Terrilee Files, PharmD 08/17/2022 4:04 PM

## 2022-08-17 NOTE — Anesthesia Procedure Notes (Signed)
Procedure Name: Intubation Date/Time: 08/17/2022 1:37 PM  Performed by: Epimenio Sarin, CRNAPre-anesthesia Checklist: Patient identified, Emergency Drugs available, Suction available, Patient being monitored and Timeout performed Patient Re-evaluated:Patient Re-evaluated prior to induction Oxygen Delivery Method: Circle system utilized Preoxygenation: Pre-oxygenation with 100% oxygen Induction Type: IV induction Ventilation: Mask ventilation without difficulty and Oral airway inserted - appropriate to patient size Laryngoscope Size: Mac and 4 Grade View: Grade I Tube type: Oral Tube size: 7.5 mm Number of attempts: 1 Airway Equipment and Method: Stylet Placement Confirmation: ETT inserted through vocal cords under direct vision, positive ETCO2 and breath sounds checked- equal and bilateral Secured at: 23 cm Tube secured with: Tape Dental Injury: Teeth and Oropharynx as per pre-operative assessment

## 2022-08-17 NOTE — Care Management Important Message (Signed)
Important Message  Patient Details IM Letter given to the Patient Name: Luis Okimoto, PhD MRN: [id number] Date of Birth: 04-Jul-1950   Medicare Important Message Given:  Yes     Caren Macadam 08/17/2022, 10:14 AM

## 2022-08-17 NOTE — Progress Notes (Signed)
  Assessment & Plan: Cholecystitis, cholelithiasis, choledocholithiasis  Successful ERCP yesterday by Dr. Karki  Off Coumadin, heparin held - INR 1.5 this AM  For cholecystectomy today  Patient seen and examined, wife at bedside.  Discussed proceeding with lap cholecystectomy today.  The risks and benefits of the procedure have been discussed at length with the patient.  The patient understands the proposed procedure, potential alternative treatments, and the course of recovery to be expected.  All of the patient's questions have been answered at this time.  The patient wishes to proceed with surgery.        Latif Nazareno, MD [facility] practice Office: [phone]        Chief Complaint: Cholecystitis, CBD stones  Subjective: Patient comfortable in bed, wife at bedside.  Did well with ERCP.  Objective: Vital signs in last 24 hours: Temp:  [97.1 F (36.2 C)-98.5 F (36.9 C)] 98.5 F (36.9 C) (04/30 0404) Pulse Rate:  [70-84] 70 (04/30 0404) Resp:  [17-20] 17 (04/30 0404) BP: (119-157)/(79-96) 137/85 (04/30 0404) SpO2:  [94 %-100 %] 97 % (04/30 0404) Weight:  [80.7 kg-81.6 kg] 81.6 kg (04/30 0423) Last BM Date : 08/15/22  Intake/Output from previous day: 04/29 0701 - 04/30 0700 In: 2048 [P.O.:120; I.V.:1145.6; IV Piggyback:782.4] Out: -  Intake/Output this shift: No intake/output data recorded.  Physical Exam: HEENT - sclerae clear, mucous membranes moist Neck - soft Abdomen - soft without distension, no mass Ext - no edema, non-tender Neuro - alert & oriented, no focal deficits  Lab Results:  Recent Labs    08/16/22 0406 08/17/22 0415  WBC 13.2* 14.0*  HGB 12.9* 12.3*  HCT 41.8 38.8*  PLT 343 368   BMET Recent Labs    08/16/22 0406 08/17/22 0415  NA 138 138  K 3.7 4.9  CL 99 100  CO2 26 27  GLUCOSE 162* 126*  BUN 15 23  CREATININE 0.93 1.03  CALCIUM 9.6 8.9   PT/INR Recent Labs    08/16/22 0406 08/17/22 0415   LABPROT 22.0* 18.2*  INR 1.9* 1.5*   Comprehensive Metabolic Panel:    Component Value Date/Time   NA 138 08/17/2022 0415   NA 138 08/16/2022 0406   K 4.9 08/17/2022 0415   K 3.7 08/16/2022 0406   CL 100 08/17/2022 0415   CL 99 08/16/2022 0406   CO2 27 08/17/2022 0415   CO2 26 08/16/2022 0406   BUN 23 08/17/2022 0415   BUN 15 08/16/2022 0406   CREATININE 1.03 08/17/2022 0415   CREATININE 0.93 08/16/2022 0406   GLUCOSE 126 (H) 08/17/2022 0415   GLUCOSE 162 (H) 08/16/2022 0406   CALCIUM 8.9 08/17/2022 0415   CALCIUM 9.6 08/16/2022 0406   AST 28 08/17/2022 0415   AST 26 08/16/2022 0406   ALT 54 (H) 08/17/2022 0415   ALT 70 (H) 08/16/2022 0406   ALKPHOS 149 (H) 08/17/2022 0415   ALKPHOS 185 (H) 08/16/2022 0406   BILITOT 1.9 (H) 08/17/2022 0415   BILITOT 2.0 (H) 08/16/2022 0406   PROT 6.6 08/17/2022 0415   PROT 7.4 08/16/2022 0406   ALBUMIN 3.1 (L) 08/17/2022 0415   ALBUMIN 3.4 (L) 08/16/2022 0406    Studies/Results: DG C-Arm 1-60 Min-No Report  Result Date: 08/16/2022 Fluoroscopy was utilized by the requesting physician.  No radiographic interpretation.   DG ERCP  Result Date: 08/16/2022 CLINICAL DATA:  72-year-old male with a history of choledocholithiasis EXAM: ERCP TECHNIQUE: Multiple spot images obtained with the fluoroscopic   device and submitted for interpretation post-procedure. FLUOROSCOPY: Radiation Exposure Index (as provided by the fluoroscopic device): 172.8 mGy Kerma COMPARISON:  MR 08/12/2022 FINDINGS: Limited intraoperative fluoroscopic spot images during ERCP. Initial image demonstrates the endoscope projecting over the upper abdomen with a safety wire in position. Subsequently there is retrograde infusion of contrast partially opacifying the extrahepatic biliary system. Multiple geometric filling defects present. There is then deployment of a retrieval basket and a retrieval balloon. IMPRESSION: Limited images during ERCP demonstrates treatment of  choledocholithiasis. Please refer to the dictated operative report for full details of intraoperative findings and procedure. Electronically Signed   By: Jaime  Wagner D.O.   On: 08/16/2022 14:57      Arshdeep Bolger 08/17/2022  Patient ID: Ivon Bitterman, PhD, male   DOB: 11/02/1950, 72 y.o.   MRN: [id number]  

## 2022-08-17 NOTE — Progress Notes (Signed)
PROGRESS NOTE  Luis Natal, PhD ZOX:[id number] DOB: Jun 28, 1950 DOA: 08/12/2022 PCP: [name], MD   LOS: 5 days   Brief Narrative / Interim history: 72 year old male with history of MVP status post mitral valve replacement, mechanical aortic valve replacement, HTN, DM2, HLD who comes into the hospital with postprandial right upper quadrant abdominal pain for several days, associated with chills, nausea.  Also reports a 6 pound unintentional weight loss, possibly related to semaglutide.  He was seen by his PCP, who found patient had elevated LFTs and he was sent to the hospital.  Imaging with concern for cholelithiasis as well as choledocholithiasis.  GI and surgery were consulted.  He was placed on antibiotics and admitted to the hospital.  INR was allowed to drift down, eventually underwent ERCP on 4/29 and plans are in place for lap chole today  Subjective / 24h Interval events: Feeling well.  ERCP went well yesterday  Assesement and Plan: Principal problem Cholecystitis, choledocholithiasis -evidenced on the MRI/MRCP obtained on admission.  General surgery as well as gastroenterology consulted.  INR was allowed to drift down, when it reached 1.9 on 4/29 underwent an ERCP status post biliary sphincterotomy, choledocholithiasis was found with balloon extraction and basket lithotripsy.  General surgery will take for laparoscopic cholecystectomy today.  Active problems Mitral and aortic valve replacements -on Coumadin, goal INR 2.5-3.5. On heparin drip since INR less than 2.5.  Heparin drip was discontinued at midnight last night in preparation for surgery today.  Discussed with Dr. Gerrit Friends, will need resumption of heparin 6-12-hour following the surgery based on intraoperative findings.  Gout, bilateral great toes -started on colchicine as well as allopurinol, gave a dose of steroids 4/28.  Improving, redness/swelling almost entirely resolved.  He is ambulating better  Elevated LFTs -due  to #1, continue to improve  DM2 - continue SSI. Hold home agents  Lab Results  Component Value Date   HGBA1C 5.7 (H) 08/13/2022    Mild AKI -due to dehydration, creatinine normalized  Hyperlipidemia -hold home regimen due to elevated LFTs  Hypertension, diastolic CHF -continue home regimen  Scheduled Meds:  allopurinol  300 mg Oral Daily   Chlorhexidine Gluconate Cloth  6 each Topical Once   And   Chlorhexidine Gluconate Cloth  6 each Topical Once   colchicine  0.6 mg Oral Daily   insulin aspart  0-9 Units Subcutaneous Q4H   lisinopril  40 mg Oral Daily   nebivolol  2.5 mg Oral Daily   pantoprazole  40 mg Oral Daily   Continuous Infusions:  ampicillin-sulbactam (UNASYN) IV 3 g (08/17/22 0909)   PRN Meds:.HYDROmorphone (DILAUDID) injection, melatonin, mouth rinse, oxyCODONE, polyethylene glycol, prochlorperazine  Current Outpatient Medications  Medication Instructions   AFRIN NASAL SPRAY 0.05 % nasal spray 1 spray, Each Nare, 2 times daily PRN   allopurinol (ZYLOPRIM) 300 mg, Oral, Daily   amoxicillin (AMOXIL) 2,000 mg, Oral, See admin instructions, Take 2,000 mg by mouth one hour prior to dental procedures   colchicine 0.6 mg, Oral, See admin instructions, Take 0.6 mg by mouth once a day as directed for gout flares   icosapent Ethyl (VASCEPA) 2 g, Oral, 2 times daily   lisinopril (ZESTRIL) 40 mg, Oral, Daily, Reported on 07/14/2015   metFORMIN (GLUCOPHAGE) 500 mg, Oral, 2 times daily   montelukast (SINGULAIR) 10 mg, Oral, Every morning   Mucinex 600 mg, Oral, Every morning   nebivolol (BYSTOLIC) 5 mg, Daily   Nexletol 180 mg, Oral, Daily   omeprazole (PRILOSEC)  40 MG capsule TAKE 1 CAPSULE(40 MG) BY MOUTH DAILY   OVER THE COUNTER MEDICATION 1-2 tablets, Oral, See admin instructions, TYLENOL Sinus + Headache Non-Drowsy Daytime Caplets for Nasal Congestion, Sinus Pressure & Pain Relief- Take 1-2 caplets by mouth every six hours as needed for migraines   Psyllium (METAMUCIL  PO) 4 capsules, Oral, Daily with breakfast   Rybelsus 14 mg, Daily   sucralfate (CARAFATE) 1 g, Oral, 4 times daily   warfarin (COUMADIN) 5 MG tablet TAKE 1 TABLET BY MOUTH EVERY DAY OR AS DIRECTED    Diet Orders (From admission, onward)     Start     Ordered   08/17/22 0932  Diet NPO time specified  Diet effective now        08/17/22 0931            DVT prophylaxis: SCD's Start: 08/17/22 0932   Lab Results  Component Value Date   PLT 368 08/17/2022      Code Status: Full Code  Family Communication: no family at bedside   Status is: Inpatient Remains inpatient appropriate because: severity of illness   Level of care: Progressive  Consultants:  GI General surgery   Objective: Vitals:   08/16/22 1524 08/16/22 2023 08/17/22 0404 08/17/22 0423  BP: (!) 124/91 135/88 137/85   Pulse: 79 76 70   Resp: 18 17 17    Temp: 98.1 F (36.7 C) 98.4 F (36.9 C) 98.5 F (36.9 C)   TempSrc: Oral Oral Oral   SpO2: 95% 95% 97%   Weight:    81.6 kg  Height:        Intake/Output Summary (Last 24 hours) at 08/17/2022 1011 Last data filed at 08/17/2022 0500 Gross per 24 hour  Intake 2047.95 ml  Output --  Net 2047.95 ml    Wt Readings from Last 3 Encounters:  08/17/22 81.6 kg  04/08/22 92.6 kg  02/22/22 93.3 kg    Examination:  Constitutional: NAD Eyes: lids and conjunctivae normal, no scleral icterus ENMT: mmm Neck: normal, supple Respiratory: clear to auscultation bilaterally, no wheezing, no crackles.  Cardiovascular: Regular rate and rhythm, mechanical clicks present Abdomen: soft, no distention, no tenderness. Bowel sounds positive.  Skin: no rashes  Data Reviewed: I have independently reviewed following labs and imaging studies   CBC Recent Labs  Lab 08/12/22 1259 08/13/22 0400 08/14/22 0430 08/15/22 0419 08/16/22 0406 08/17/22 0415  WBC 12.7* 8.6 7.7 10.3 13.2* 14.0*  HGB 13.8 13.0 12.1* 12.4* 12.9* 12.3*  HCT 42.3 40.3 38.2* 38.0* 41.8 38.8*   PLT 292 265 282 313 343 368  MCV 89.8 92.6 91.0 89.4 92.5 92.6  MCH 29.3 29.9 28.8 29.2 28.5 29.4  MCHC 32.6 32.3 31.7 32.6 30.9 31.7  RDW 14.5 14.6 14.3 14.5 14.6 14.6  LYMPHSABS 1.5  --   --   --   --   --   MONOABS 1.3*  --   --   --   --   --   EOSABS 0.0  --   --   --   --   --   BASOSABS 0.1  --   --   --   --   --      Recent Labs  Lab 08/13/22 0400 08/14/22 0430 08/15/22 0419 08/16/22 0406 08/17/22 0415  NA 140 138 135 138 138  K 4.3 4.1 4.0 3.7 4.9  CL 100 100 97* 99 100  CO2 30 26 25 26 27   GLUCOSE 112* 107* 131*  162* 126*  BUN 25* 15 11 15 23   CREATININE 1.05 0.97 0.97 0.93 1.03  CALCIUM 9.2 8.7* 8.6* 9.6 8.9  AST 161* 71* 39 26 28  ALT 177* 119* 87* 70* 54*  ALKPHOS 220* 209* 195* 185* 149*  BILITOT 4.7* 2.8* 2.3* 2.0* 1.9*  ALBUMIN 3.2* 3.1* 3.3* 3.4* 3.1*  MG 2.2 1.8 1.9  --  2.2  INR 2.9* 2.4* 2.1* 1.9* 1.5*  HGBA1C 5.7*  --   --   --   --      ------------------------------------------------------------------------------------------------------------------ No results for input(s): "CHOL", "HDL", "LDLCALC", "TRIG", "CHOLHDL", "LDLDIRECT" in the last 72 hours.  Lab Results  Component Value Date   HGBA1C 5.7 (H) 08/13/2022   ------------------------------------------------------------------------------------------------------------------ No results for input(s): "TSH", "T4TOTAL", "T3FREE", "THYROIDAB" in the last 72 hours.  Invalid input(s): "FREET3"  Cardiac Enzymes No results for input(s): "CKMB", "TROPONINI", "MYOGLOBIN" in the last 168 hours.  Invalid input(s): "CK" ------------------------------------------------------------------------------------------------------------------ No results found for: "BNP"  CBG: Recent Labs  Lab 08/16/22 1605 08/16/22 2026 08/16/22 2310 08/17/22 0406 08/17/22 0803  GLUCAP 146* 286* 98 128* 107*     No results found for this or any previous visit (from the past 240 hour(s)).   Radiology  Studies: DG C-Arm 1-60 Min-No Report  Result Date: 08/16/2022 Fluoroscopy was utilized by the requesting physician.  No radiographic interpretation.   DG ERCP  Result Date: 08/16/2022 CLINICAL DATA:  72 year old male with a history of choledocholithiasis EXAM: ERCP TECHNIQUE: Multiple spot images obtained with the fluoroscopic device and submitted for interpretation post-procedure. FLUOROSCOPY: Radiation Exposure Index (as provided by the fluoroscopic device): 172.8 mGy Kerma COMPARISON:  MR 08/12/2022 FINDINGS: Limited intraoperative fluoroscopic spot images during ERCP. Initial image demonstrates the endoscope projecting over the upper abdomen with a safety wire in position. Subsequently there is retrograde infusion of contrast partially opacifying the extrahepatic biliary system. Multiple geometric filling defects present. There is then deployment of a retrieval basket and a retrieval balloon. IMPRESSION: Limited images during ERCP demonstrates treatment of choledocholithiasis. Please refer to the dictated operative report for full details of intraoperative findings and procedure. Electronically Signed   By: Gilmer Mor D.O.   On: 08/16/2022 14:57     Pamella Pert, MD, PhD [facility]  Between 7 am - 7 pm I am available, please contact me via Amion (for emergencies) or Securechat (non urgent messages)  Between 7 pm - 7 am I am not available, please contact night coverage MD/APP via Amion

## 2022-08-17 NOTE — Discharge Instructions (Signed)
[facility]  Please arrive at least 30 min before your appointment to complete your check in paperwork.  If you are unable to arrive 30 min prior to your appointment time we may have to cancel or reschedule you. LAPAROSCOPIC SURGERY: POST OP INSTRUCTIONS Always review your discharge instruction sheet given to you by the facility where your surgery was performed. IF YOU HAVE DISABILITY OR FAMILY LEAVE FORMS, YOU MUST BRING THEM TO THE OFFICE FOR PROCESSING.   DO NOT GIVE THEM TO YOUR DOCTOR.  PAIN CONTROL  First take acetaminophen (Tylenol) AND/or ibuprofen (Advil) to control your pain after surgery.  Follow directions on package.  Taking acetaminophen (Tylenol) and/or ibuprofen (Advil) regularly after surgery will help to control your pain and lower the amount of prescription pain medication you may need.  You should not take more than 4,000 mg (4 grams) of acetaminophen (Tylenol) in 24 hours.  You should not take ibuprofen (Advil), aleve, motrin, naprosyn or other NSAIDS if you have a history of stomach ulcers or chronic kidney disease.  A prescription for pain medication may be given to you upon discharge.  Take your pain medication as prescribed, if you still have uncontrolled pain after taking acetaminophen (Tylenol) or ibuprofen (Advil). Use ice packs to help control pain. If you need a refill on your pain medication, please contact your pharmacy.  They will contact our office to request authorization. Prescriptions will not be filled after 5pm or on week-ends.  HOME MEDICATIONS Take your usually prescribed medications unless otherwise directed.  DIET You should follow a light diet the first few days after arrival home.  Be sure to include lots of fluids daily. Avoid fatty, fried foods.   CONSTIPATION It is common to experience some constipation after surgery and if you are taking pain medication.  Increasing fluid intake and taking a stool softener (such as Colace)  will usually help or prevent this problem from occurring.  A mild laxative (Milk of Magnesia or Miralax) should be taken according to package instructions if there are no bowel movements after 48 hours.  WOUND/INCISION CARE Most patients will experience some swelling and bruising in the area of the incisions.  Ice packs will help.  Swelling and bruising can take several days to resolve.  Unless discharge instructions indicate otherwise, follow guidelines below  STERI-STRIPS - you may remove your outer bandages 48 hours after surgery, and you may shower at that time.  You have steri-strips (small skin tapes) in place directly over the incision.  These strips should be left on the skin for 7-10 days.   DERMABOND/SKIN GLUE - you may shower in 24 hours.  The glue will flake off over the next 2-3 weeks. Any sutures or staples will be removed at the office during your follow-up visit.  ACTIVITIES You may resume regular (light) daily activities beginning the next day--such as daily self-care, walking, climbing stairs--gradually increasing activities as tolerated.  You may have sexual intercourse when it is comfortable.  Refrain from any heavy lifting or straining until approved by your doctor. You may drive when you are no longer taking prescription pain medication, you can comfortably wear a seatbelt, and you can safely maneuver your car and apply brakes.  FOLLOW-UP You should see your doctor in the office for a follow-up appointment approximately 2-3 weeks after your surgery.  You should have been given your post-op/follow-up appointment when your surgery was scheduled.  If you did not receive a post-op/follow-up appointment, make sure   that you call for this appointment within a day or two after you arrive home to insure a convenient appointment time.   WHEN TO CALL YOUR DOCTOR: Fever over 101.0 Inability to urinate Continued bleeding from incision. Increased pain, redness, or drainage from the  incision. Increasing abdominal pain  The clinic staff is available to answer your questions during regular business hours.  Please don't hesitate to call and ask to speak to one of the nurses for clinical concerns.  If you have a medical emergency, go to the nearest emergency room or call 911.  A surgeon from [facility] is always on call at the hospital. [address], [address], [place], [place]  [address] ? [address] [address], [place], [place]   [address] [phone] ? [phone] ? FAX [phone]  

## 2022-08-18 ENCOUNTER — Encounter (HOSPITAL_COMMUNITY): Payer: Self-pay | Admitting: Surgery

## 2022-08-18 ENCOUNTER — Other Ambulatory Visit (HOSPITAL_COMMUNITY): Payer: Self-pay

## 2022-08-18 ENCOUNTER — Telehealth (HOSPITAL_COMMUNITY): Payer: Self-pay | Admitting: Pharmacy Technician

## 2022-08-18 DIAGNOSIS — K8050 Calculus of bile duct without cholangitis or cholecystitis without obstruction: Secondary | ICD-10-CM | POA: Diagnosis not present

## 2022-08-18 LAB — COMPREHENSIVE METABOLIC PANEL
ALT: 56 U/L — ABNORMAL HIGH (ref 0–44)
AST: 44 U/L — ABNORMAL HIGH (ref 15–41)
Albumin: 2.9 g/dL — ABNORMAL LOW (ref 3.5–5.0)
Alkaline Phosphatase: 131 U/L — ABNORMAL HIGH (ref 38–126)
Anion gap: 8 (ref 5–15)
BUN: 16 mg/dL (ref 8–23)
CO2: 29 mmol/L (ref 22–32)
Calcium: 8.5 mg/dL — ABNORMAL LOW (ref 8.9–10.3)
Chloride: 102 mmol/L (ref 98–111)
Creatinine, Ser: 0.93 mg/dL (ref 0.61–1.24)
GFR, Estimated: >60 mL/min (ref >60)
Glucose, Bld: 101 mg/dL — ABNORMAL HIGH (ref 70–99)
Potassium: 4.5 mmol/L (ref 3.5–5.1)
Sodium: 139 mmol/L (ref 135–145)
Total Bilirubin: 1.4 mg/dL — ABNORMAL HIGH (ref 0.3–1.2)
Total Protein: 6.2 g/dL — ABNORMAL LOW (ref 6.5–8.1)

## 2022-08-18 LAB — GLUCOSE, CAPILLARY
Glucose-Capillary: 101 mg/dL — ABNORMAL HIGH (ref 70–99)
Glucose-Capillary: 131 mg/dL — ABNORMAL HIGH (ref 70–99)
Glucose-Capillary: 136 mg/dL — ABNORMAL HIGH (ref 70–99)
Glucose-Capillary: 90 mg/dL (ref 70–99)

## 2022-08-18 LAB — CBC
HCT: 38.3 % — ABNORMAL LOW (ref 39.0–52.0)
Hemoglobin: 12.2 g/dL — ABNORMAL LOW (ref 13.0–17.0)
MCH: 29.6 pg (ref 26.0–34.0)
MCHC: 31.9 g/dL (ref 30.0–36.0)
MCV: 93 fL (ref 80.0–100.0)
Platelets: 402 10*3/uL — ABNORMAL HIGH (ref 150–400)
RBC: 4.12 MIL/uL — ABNORMAL LOW (ref 4.22–5.81)
RDW: 14.6 % (ref 11.5–15.5)
WBC: 12.7 10*3/uL — ABNORMAL HIGH (ref 4.0–10.5)
nRBC: 0 % (ref 0.0–0.2)

## 2022-08-18 LAB — MAGNESIUM: Magnesium: 2.1 mg/dL (ref 1.7–2.4)

## 2022-08-18 LAB — PROTIME-INR
INR: 1.4 — ABNORMAL HIGH (ref 0.8–1.2)
Prothrombin Time: 17.2 seconds — ABNORMAL HIGH (ref 11.4–15.2)

## 2022-08-18 LAB — HEPARIN LEVEL (UNFRACTIONATED): Heparin Unfractionated: 0.41 IU/mL (ref 0.30–0.70)

## 2022-08-18 MED ORDER — ENOXAPARIN SODIUM 80 MG/0.8ML IJ SOSY
80.0000 mg | PREFILLED_SYRINGE | Freq: Two times a day (BID) | INTRAMUSCULAR | Status: DC
  Administered 2022-08-18: 80 mg via SUBCUTANEOUS
  Filled 2022-08-18: qty 0.8

## 2022-08-18 MED ORDER — ENOXAPARIN SODIUM 80 MG/0.8ML IJ SOSY: 80.0000 mg | PREFILLED_SYRINGE | Freq: Two times a day (BID) | INTRAMUSCULAR | 0 refills | Status: DC

## 2022-08-18 MED ORDER — OXYCODONE HCL 5 MG PO TABS: 5.0000 mg | ORAL_TABLET | Freq: Four times a day (QID) | ORAL | 0 refills | Status: AC | PRN

## 2022-08-18 MED ORDER — POLYETHYLENE GLYCOL 3350 17 G PO PACK: 17.0000 g | PACK | Freq: Every day | ORAL | 0 refills | Status: DC

## 2022-08-18 NOTE — Progress Notes (Signed)
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)  Allergies  Allergen Reactions   Erythromycin Other (See Comments)    GI Intolerance   Sulfamethoxazole Other (See Comments)    GI Intolerance   Vytorin [Ezetimibe-Simvastatin] Other (See Comments)    LEG CRAMPS    Patient Measurements: Height: 6\' 1"  (185.4 cm) Weight: 80.9 kg (178 lb 5.6 oz) IBW/kg (Calculated) : 79.9 Heparin Dosing Weight: 81 kg  Vital Signs: Temp: 98.5 F (36.9 C) (05/01 0436) Temp Source: Oral (05/01 0436) BP: 141/87 (05/01 0436) Pulse Rate: 72 (05/01 0436)  Labs: Recent Labs    08/15/22 1803 08/16/22 0406 08/16/22 0406 08/17/22 0415 08/18/22 0418  HGB  --  12.9*   < > 12.3* 12.2*  HCT  --  41.8  --  38.8* 38.3*  PLT  --  343  --  368 402*  LABPROT  --  22.0*  --  18.2* 17.2*  INR  --  1.9*  --  1.5* 1.4*  HEPARINUNFRC 0.41 0.49  --   --  0.41  CREATININE  --  0.93  --  1.03 0.93   < > = values in this interval not displayed.    Estimated Creatinine Clearance: 81.1 mL/min (by C-G formula based on SCr of 0.93 mg/dL).   Medications:  - PTA warfarin regimen: 5 mg daily. 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  ERCP on 08/16/22 showed choledocholithiasis with stones removed during procedure. He subsequently underwent lap cholecystectomy on 08/17/22. Heparin drip started/resumed postop on 08/17/22 at 9p. Pharmacy has been consulted to transition pt to Lovenox on 5/1.  Today, 08/18/2022: - INR 1.4 - cbc  stable - no bleeding documented   Goal of Therapy:  Heparin level 0.3-0.7 units/ml Monitor platelets by anticoagulation protocol: Yes   Plan:  - stop heparin drip and start lovenox 80 mg SQ q12h - monitor for s/sx bleeding   Yoseline Andersson,  Richy Spradley P 08/18/2022,10:09 AM

## 2022-08-18 NOTE — TOC Benefit Eligibility Note (Signed)
Patient Product/process development scientist completed.    The patient is currently admitted and upon discharge could be taking enoxaparin (Lovenox) 80 mg/0.8 ml inj.  The current 7 day co-pay is $10.00.   The patient is insured through [employer] Part D   This test claim was processed through [facility]- copay amounts may vary at other pharmacies due to pharmacy/plan contracts, or as the patient moves through the different stages of their insurance plan.  Roland Earl, CPHT Pharmacy Patient Advocate Specialist [facility] Patient Advocate Team Direct Number: [phone]  Fax: [phone]

## 2022-08-18 NOTE — Discharge Summary (Signed)
Physician Discharge Summary  Luis Natal, PhD ZOX:[id number] DOB: Jan 04, 1951 DOA: 08/12/2022  PCP: Luis Stabile, MD  Admit date: 08/12/2022 Discharge date: 08/18/2022  Time spent: 40 minutes  Recommendations for Outpatient Follow-up:  Follow outpatient CBC/CMP  Follow INR outpatient, lovenox bridge  Follow LFT's Follow gout    Discharge Diagnoses:  Principal Problem:   Choledocholithiasis   Discharge Condition: stable  Diet recommendation: heart healthy, diabetic  Filed Weights   08/16/22 1201 08/17/22 0423 08/18/22 0457  Weight: 80.7 kg 81.6 kg 80.9 kg    History of present illness:  72 year old male with history of MVP status post mitral valve replacement, mechanical aortic valve replacement, HTN, DM2, HLD who comes into the hospital with postprandial right upper quadrant abdominal pain for several days, associated with chills, nausea.  Also reports Luis Haynes 6 pound unintentional weight loss, possibly related to semaglutide.  He was seen by his PCP, who found patient had elevated LFTs and he was sent to the hospital.  Imaging with concern for cholelithiasis as well as choledocholithiasis.  GI and surgery were consulted.  He was placed on antibiotics and admitted to the hospital.  INR was allowed to drift down, eventually underwent ERCP on 4/29 and is s/p lap chole on 4/30.  Stable for discharge on 5/1 with lovenox bridge.   Hospital Course:  Assessment and Plan:  Cholecystitis, choledocholithiasis  Noted on MRCP - s/p ERCP with choledocholithiasis with removal by biliary spinchterotomy and balloon extraction and basket lithotripsy, biliary sphincterotomy S/p lap chole on 4/30  Stable for discharge, doing well 5/1 Discussed with surgery - follow outpatient as planned   Mitral and aortic valve replacements -on Coumadin, goal INR 2.5-3.5 Discharge with lovenox bridge, he's comfortable with this, has done this before   Gout, bilateral great toes -started on colchicine as well as  allopurinol, gave Luis Haynes dose of steroids 4/28.  Improved.   Elevated LFTs - due to choledocholithiasis, improving, follow    DM2 - resume home meds   Mild AKI - resolved   Hyperlipidemia -resume home meds   Hypertension, diastolic CHF -continue home regimen     Procedures: ERCP - The entire main bile duct was markedly dilated. - Choledocholithiasis was found. Complete removal was accomplished by biliary sphincterotomy and balloon extraction and basket lithotripsy. - Luis Haynes biliary sphincterotomy was performed. - The biliary tree was swept.  recommendations -- Clear liquid diet. - Resume heparin at prior dose today in 4 hours. - Cholecystectomy this admission.  Laparoscopic cholecystectomy   Consultations: Surgery gastroenterology  Discharge Exam: Vitals:   08/17/22 2024 08/18/22 0436  BP: (!) 143/87 (!) 141/87  Pulse: 73 72  Resp: 17 17  Temp: 98.3 F (36.8 C) 98.5 F (36.9 C)  SpO2: 97% 95%   No complaints Appreciative of care, ready for discharge  General: No acute distress. Cardiovascular: Heart sounds show Luis Haynes regular rate, and rhythm. No gallops or rubs. No murmurs. No JVD. Lungs: Clear to auscultation bilaterally with good air movement. No rales, rhonchi or wheezes. Abdomen: surgical incisions appear to be healing well Neurological: Alert and oriented 3. Moves all extremities 4 with equal strength. Cranial nerves II through XII grossly intact. Extremities: No clubbing or cyanosis. No edema.   Discharge Instructions   Discharge Instructions     Call MD for:  difficulty breathing, headache or visual disturbances   Complete by: As directed    Call MD for:  extreme fatigue   Complete by: As directed    Call MD for:  hives   Complete by: As directed    Call MD for:  persistant dizziness or light-headedness   Complete by: As directed    Call MD for:  persistant nausea and vomiting   Complete by: As directed    Call MD for:  redness, tenderness, or signs of  infection (pain, swelling, redness, odor or green/yellow discharge around incision site)   Complete by: As directed    Call MD for:  severe uncontrolled pain   Complete by: As directed    Call MD for:  temperature >100.4   Complete by: As directed    Diet - low sodium heart healthy   Complete by: As directed    Discharge instructions   Complete by: As directed    You were seen for cholecystitis (inflammation of the gall bladder) and obstructing gall stones.    You had an ERCP with gastroenterology and Luis Haynes laparoscopic cholecystectomy with general surgery.  You've improved after intervention.  We'll send you home with plans to follow with surgery outpatient.  We'll restart your anticoagulation with Luis Haynes lovenox bridge.  Continue lovenox with warfarin until your INR reaches 2.5 (then stop lovenox).  Please arrange follow up with your coumadin clinic this Friday and then sometime next week to ensure you're on track and so they can direct you to make changes if needed.  Return for new, recurrent, or worsening symptoms.  Please ask your PCP to request records from this hospitalization so they know what was done and what the next steps will be.   Increase activity slowly   Complete by: As directed       Allergies as of 08/18/2022       Reactions   Erythromycin Other (See Comments)   GI Intolerance   Sulfamethoxazole Other (See Comments)   GI Intolerance   Vytorin [ezetimibe-simvastatin] Other (See Comments)   LEG CRAMPS        Medication List     STOP taking these medications    sucralfate 1 GM/10ML suspension Commonly known as: CARAFATE       TAKE these medications    Afrin Nasal Spray 0.05 % nasal spray Generic drug: oxymetazoline Place 1 spray into both nostrils 2 (two) times daily as needed for congestion.   allopurinol 300 MG tablet Commonly known as: ZYLOPRIM Take 300 mg by mouth daily.   amoxicillin 500 MG capsule Commonly known as: AMOXIL Take 2,000 mg by  mouth See admin instructions. Take 2,000 mg by mouth one hour prior to dental procedures   colchicine 0.6 MG tablet Take 0.6 mg by mouth See admin instructions. Take 0.6 mg by mouth once Luis Haynes day as directed for gout flares   enoxaparin 80 MG/0.8ML injection Commonly known as: LOVENOX Inject 0.8 mLs (80 mg total) into the skin every 12 (twelve) hours for 7 days. Take until your INR is to goal (INR 2.5-3.5).  Follow up with your warfarin clinic for additional recommendations.   icosapent Ethyl 1 g capsule Commonly known as: VASCEPA Take 2 g by mouth 2 (two) times daily.   lisinopril 40 MG tablet Commonly known as: ZESTRIL Take 40 mg by mouth daily. Reported on 07/14/2015   METAMUCIL PO Take 4 capsules by mouth daily with breakfast.   metFORMIN 500 MG tablet Commonly known as: GLUCOPHAGE Take 500 mg by mouth in the morning and at bedtime.   montelukast 10 MG tablet Commonly known as: SINGULAIR Take 10 mg by mouth in the morning.   Mucinex 600 MG  12 hr tablet Generic drug: guaiFENesin Take 600 mg by mouth in the morning.   nebivolol 5 MG tablet Commonly known as: BYSTOLIC Take 5 mg by mouth daily.   Nexletol 180 MG Tabs Generic drug: Bempedoic Acid Take 180 mg by mouth daily.   omeprazole 40 MG capsule Commonly known as: PRILOSEC TAKE 1 CAPSULE(40 MG) BY MOUTH DAILY What changed: See the new instructions.   OVER THE COUNTER MEDICATION Take 1-2 tablets by mouth See admin instructions. TYLENOL Sinus + Headache Non-Drowsy Daytime Caplets for Nasal Congestion, Sinus Pressure & Pain Relief- Take 1-2 caplets by mouth every six hours as needed for migraines   oxyCODONE 5 MG immediate release tablet Commonly known as: Oxy IR/ROXICODONE Take 1 tablet (5 mg total) by mouth every 6 (six) hours as needed for up to 5 days for moderate pain or breakthrough pain.   Rybelsus 14 MG Tabs Generic drug: Semaglutide Take 14 mg by mouth daily.   warfarin 5 MG tablet Commonly known as:  COUMADIN Take as directed. If you are unsure how to take this medication, talk to your nurse or doctor. Original instructions: TAKE 1 TABLET BY MOUTH EVERY DAY OR AS DIRECTED What changed:  how much to take how to take this when to take this additional instructions       Allergies  Allergen Reactions   Erythromycin Other (See Comments)    GI Intolerance   Sulfamethoxazole Other (See Comments)    GI Intolerance   Vytorin [Ezetimibe-Simvastatin] Other (See Comments)    LEG CRAMPS    Follow-up Information     Maczis, Puja Gosai, PA-C. Go on 09/14/2022.   Specialty: General Surgery Why: 09/14/22 at 11 am, Please arrive 30 minutes early to complete check in, and bring photo ID and insurance card. Contact information: [address] [facility] [place] [address] [phone]                  The results of significant diagnostics from this hospitalization (including imaging, microbiology, ancillary and laboratory) are listed below for reference.    Significant Diagnostic Studies: DG C-Arm 1-60 Min-No Report  Result Date: 08/16/2022 Fluoroscopy was utilized by the requesting physician.  No radiographic interpretation.   DG ERCP  Result Date: 08/16/2022 CLINICAL DATA:  72 year old male with Cledith Abdou history of choledocholithiasis EXAM: ERCP TECHNIQUE: Multiple spot images obtained with the fluoroscopic device and submitted for interpretation post-procedure. FLUOROSCOPY: Radiation Exposure Index (as provided by the fluoroscopic device): 172.8 mGy Kerma COMPARISON:  MR 08/12/2022 FINDINGS: Limited intraoperative fluoroscopic spot images during ERCP. Initial image demonstrates the endoscope projecting over the upper abdomen with Alann Avey safety wire in position. Subsequently there is retrograde infusion of contrast partially opacifying the extrahepatic biliary system. Multiple geometric filling defects present. There is then deployment of Tylisa Alcivar retrieval basket  and Cevin Rubinstein retrieval balloon. IMPRESSION: Limited images during ERCP demonstrates treatment of choledocholithiasis. Please refer to the dictated operative report for full details of intraoperative findings and procedure. Electronically Signed   By: Gilmer Mor D.O.   On: 08/16/2022 14:57   MR ABDOMEN MRCP W WO CONTAST  Result Date: 08/12/2022 CLINICAL DATA:  Jaundice, cholelithiasis, biliary ductal dilatation by ultrasound EXAM: MRI ABDOMEN WITHOUT AND WITH CONTRAST (INCLUDING MRCP) TECHNIQUE: Multiplanar multisequence MR imaging of the abdomen was performed both before and after the administration of intravenous contrast. Heavily T2-weighted images of the biliary and pancreatic ducts were obtained, and three-dimensional MRCP images were rendered by post processing. CONTRAST:  7.66mL GADAVIST GADOBUTROL 1 MMOL/ML IV SOLN COMPARISON:  Same-day right upper quadrant ultrasound FINDINGS: Lower chest: No acute abnormality. Elevation of the right hemidiaphragm. Hepatobiliary: No solid liver abnormality is seen. Distended gallbladder containing multiple gallstones. Gallbladder wall thickening or pericholecystic fluid. Intra and extrahepatic biliary ductal dilatation, the common bile duct measuring up to 1.7 cm in caliber, with numerous calculi stacked within the common bile duct, at least 6, measuring up to 1.1 cm in caliber (series 3, image 15). Pancreas: Unremarkable. No pancreatic ductal dilatation or surrounding inflammatory changes. Spleen: Normal in size without significant abnormality. Adrenals/Urinary Tract: Adrenal glands are unremarkable. Simple, benign right renal cortical cysts, for which no further follow-up or characterization is required. Kidneys are otherwise normal, without renal calculi, solid lesion, or hydronephrosis. Stomach/Bowel: Stomach is within normal limits. No evidence of bowel wall thickening, distention, or inflammatory changes. Vascular/Lymphatic: No significant vascular findings are present.  No enlarged abdominal lymph nodes. Other: No abdominal wall hernia or abnormality. No ascites. Musculoskeletal: No acute or significant osseous findings. IMPRESSION: 1. Distended gallbladder containing multiple gallstones. No gallbladder wall thickening or pericholecystic fluid. 2. Intra and extrahepatic biliary ductal dilatation, the common bile duct measuring up to 1.7 cm in caliber, with numerous calculi stacked within the common bile duct, at least 6, measuring up to 1.1 cm in caliber. These results will be called to the ordering clinician or representative by the Radiologist Assistant, and communication documented in the PACS or [employer]. Electronically Signed   By: Jearld Lesch M.D.   On: 08/12/2022 21:32   MR 3D Recon At Scanner  Result Date: 08/12/2022 CLINICAL DATA:  Jaundice, cholelithiasis, biliary ductal dilatation by ultrasound EXAM: MRI ABDOMEN WITHOUT AND WITH CONTRAST (INCLUDING MRCP) TECHNIQUE: Multiplanar multisequence MR imaging of the abdomen was performed both before and after the administration of intravenous contrast. Heavily T2-weighted images of the biliary and pancreatic ducts were obtained, and three-dimensional MRCP images were rendered by post processing. CONTRAST:  7.57mL GADAVIST GADOBUTROL 1 MMOL/ML IV SOLN COMPARISON:  Same-day right upper quadrant ultrasound FINDINGS: Lower chest: No acute abnormality. Elevation of the right hemidiaphragm. Hepatobiliary: No solid liver abnormality is seen. Distended gallbladder containing multiple gallstones. Gallbladder wall thickening or pericholecystic fluid. Intra and extrahepatic biliary ductal dilatation, the common bile duct measuring up to 1.7 cm in caliber, with numerous calculi stacked within the common bile duct, at least 6, measuring up to 1.1 cm in caliber (series 3, image 15). Pancreas: Unremarkable. No pancreatic ductal dilatation or surrounding inflammatory changes. Spleen: Normal in size without significant abnormality.  Adrenals/Urinary Tract: Adrenal glands are unremarkable. Simple, benign right renal cortical cysts, for which no further follow-up or characterization is required. Kidneys are otherwise normal, without renal calculi, solid lesion, or hydronephrosis. Stomach/Bowel: Stomach is within normal limits. No evidence of bowel wall thickening, distention, or inflammatory changes. Vascular/Lymphatic: No significant vascular findings are present. No enlarged abdominal lymph nodes. Other: No abdominal wall hernia or abnormality. No ascites. Musculoskeletal: No acute or significant osseous findings. IMPRESSION: 1. Distended gallbladder containing multiple gallstones. No gallbladder wall thickening or pericholecystic fluid. 2. Intra and extrahepatic biliary ductal dilatation, the common bile duct measuring up to 1.7 cm in caliber, with numerous calculi stacked within the common bile duct, at least 6, measuring up to 1.1 cm in caliber. These results will be called to the ordering clinician or representative by the Radiologist Assistant, and communication documented in the PACS or [employer]. Electronically Signed   By: Bonna Gains.D.  On: 08/12/2022 21:32   US Abdomen Limited RUQ (LIVER/GB)  Result Date: 08/12/2022 CLINICAL DATA:  Right upper quadrant pain. EXAM: ULTRASOUND ABDOMEN LIMITED RIGHT UPPER QUADRANT COMPARISON:  None Available. FINDINGS: Gallbladder: Layering large calculus measures 2.3 cm seen in the neck of the gallbladder. There is moderate gallbladder wall thickening measuring 6.3 mm. Common bile duct: Diameter: 1.6 cm Liver: No focal lesion identified. Within normal limits in parenchymal echogenicity. Limited visualization of the left lobe. Portal vein is patent on color Doppler imaging with normal direction of blood flow towards the liver. Other: None. IMPRESSION: Cholelithiasis with moderate gallbladder wall thickening. No pericholecystic fluid seen. In the correct clinical settings this may  represent acute cholecystitis Dilation of the common bile duct to 1.6 cm, concerning for choledocholithiasis or other source of downstream biliary ductal obstruction. Electronically Signed   By: Ted Mcalpine M.D.   On: 08/12/2022 13:38    Microbiology: No results found for this or any previous visit (from the past 240 hour(s)).   Labs: Basic Metabolic Panel: Recent Labs  Lab 08/13/22 0400 08/14/22 0430 08/15/22 0419 08/16/22 0406 08/17/22 0415 08/18/22 0418  NA 140 138 135 138 138 139  K 4.3 4.1 4.0 3.7 4.9 4.5  CL 100 100 97* 99 100 102  CO2 30 26 25 26 27 29   GLUCOSE 112* 107* 131* 162* 126* 101*  BUN 25* 15 11 15 23 16   CREATININE 1.05 0.97 0.97 0.93 1.03 0.93  CALCIUM 9.2 8.7* 8.6* 9.6 8.9 8.5*  MG 2.2 1.8 1.9  --  2.2 2.1  PHOS 2.5  --   --   --   --   --    Liver Function Tests: Recent Labs  Lab 08/14/22 0430 08/15/22 0419 08/16/22 0406 08/17/22 0415 08/18/22 0418  AST 71* 39 26 28 44*  ALT 119* 87* 70* 54* 56*  ALKPHOS 209* 195* 185* 149* 131*  BILITOT 2.8* 2.3* 2.0* 1.9* 1.4*  PROT 6.3* 6.6 7.4 6.6 6.2*  ALBUMIN 3.1* 3.3* 3.4* 3.1* 2.9*   Recent Labs  Lab 08/12/22 1259 08/13/22 0400  LIPASE 118* 154*   No results for input(s): "AMMONIA" in the last 168 hours. CBC: Recent Labs  Lab 08/12/22 1259 08/13/22 0400 08/14/22 0430 08/15/22 0419 08/16/22 0406 08/17/22 0415 08/18/22 0418  WBC 12.7*   < > 7.7 10.3 13.2* 14.0* 12.7*  NEUTROABS 9.8*  --   --   --   --   --   --   HGB 13.8   < > 12.1* 12.4* 12.9* 12.3* 12.2*  HCT 42.3   < > 38.2* 38.0* 41.8 38.8* 38.3*  MCV 89.8   < > 91.0 89.4 92.5 92.6 93.0  PLT 292   < > 282 313 343 368 402*   < > = values in this interval not displayed.   Cardiac Enzymes: No results for input(s): "CKTOTAL", "CKMB", "CKMBINDEX", "TROPONINI" in the last 168 hours. BNP: BNP (last 3 results) No results for input(s): "BNP" in the last 8760 hours.  ProBNP (last 3 results) No results for input(s): "PROBNP" in the  last 8760 hours.  CBG: Recent Labs  Lab 08/17/22 1628 08/17/22 2019 08/18/22 0011 08/18/22 0433 08/18/22 0806  GLUCAP 133* 181* 131* 101* 90       Signed:  Lacretia Nicks MD.  [facility] 08/18/2022, 10:58 AM

## 2022-08-18 NOTE — Progress Notes (Signed)
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)  Allergies  Allergen Reactions   Erythromycin Other (See Comments)    GI Intolerance   Sulfamethoxazole Other (See Comments)    GI Intolerance   Vytorin [Ezetimibe-Simvastatin] Other (See Comments)    LEG CRAMPS   Patient Measurements: Height: 6\' 1"  (185.4 cm) Weight: 80.9 kg (178 lb 5.6 oz) IBW/kg (Calculated) : 79.9 Heparin Dosing Weight: total body weight  Vital Signs: Temp: 98.5 F (36.9 C) (05/01 0436) Temp Source: Oral (05/01 0436) BP: 141/87 (05/01 0436) Pulse Rate: 72 (05/01 0436)  Labs: Recent Labs    08/15/22 1803 08/16/22 0406 08/16/22 0406 08/17/22 0415 08/18/22 0418  HGB  --  12.9*   < > 12.3* 12.2*  HCT  --  41.8  --  38.8* 38.3*  PLT  --  343  --  368 402*  LABPROT  --  22.0*  --  18.2* 17.2*  INR  --  1.9*  --  1.5* 1.4*  HEPARINUNFRC 0.41 0.49  --   --  0.41  CREATININE  --  0.93  --  1.03  --    < > = values in this interval not displayed.     Estimated Creatinine Clearance: 73.3 mL/min (by C-G formula based on SCr of 1.03 mg/dL).   Medical History: Past Medical History:  Diagnosis Date   ALLERGIC RHINITIS    Diabetes mellitus without complication (HCC)    H/O mitral valve replacement    10 yrs ago   HYPERLIPIDEMIA    Hypertension    Long term current use of anticoagulant    MITRAL VALVE PROLAPSE    PREMATURE VENTRICULAR CONTRACTIONS     Medications:  PTA Warfarin 5mg  PO daily-last dose 08/11/22 at 2115 INR goal 2.5-3.5 per Anticoag Clinic notes  Assessment: 53 y/oM with PMH of mechanical MVR on warfarin PTA who presents with right upper quadrant abdominal pain after eating. [place]: Cholelithiasis with moderate gallbladder wall thickening. No pericholecystic fluid seen. In the correct clinical settings this may represent acute cholecystitis. Dilation of the common bile duct to 1.6 cm, concerning for choledocholithiasis or other  source of downstream biliary ductal obstruction.  Warfarin held on admission in case surgical intervention required. Pharmacy consulted for heparin dosing. INR = 3. Hgb 13.8, Plt 292 on admission.   Significant events:  Post-lap chole: surgery okayed to resume heparin gtt on 4/30 at 2100 without bolus    08/18/2022 HL 0.41 therapeutic on 1850 units/hr Hgb 12.2, plts 402 No bleeding noted per RN   Goal of Therapy:  Heparin level 0.3-0.7 units/ml Monitor platelets by anticoagulation protocol: Yes   Plan:  Continue heparin drip at 1850 units/hr Confirmatory level in 8 hours Daily CBC and heparin level Monitor closely for s/sx of bleeding. F/u plans to restart of warfarin  Arley Phenix RPh 08/18/2022, 5:03 AM

## 2022-08-18 NOTE — Progress Notes (Signed)
Progress Note  1 Day Post-Op  Subjective: Pain very well controlled post op (max 2/10). Tolerating regular diet   Objective: Vital signs in last 24 hours: Temp:  [97.6 F (36.4 C)-98.5 F (36.9 C)] 98.5 F (36.9 C) (05/01 0436) Pulse Rate:  [69-77] 72 (05/01 0436) Resp:  [9-17] 17 (05/01 0436) BP: (141-177)/(67-95) 141/87 (05/01 0436) SpO2:  [92 %-100 %] 95 % (05/01 0436) Weight:  [80.9 kg] 80.9 kg (05/01 0457) Last BM Date : 08/16/22  Intake/Output from previous day: 04/30 0701 - 05/01 0700 In: 1463.9 [P.O.:222; I.V.:841.9; IV Piggyback:400] Out: 370 [Urine:350; Blood:20] Intake/Output this shift: No intake/output data recorded.  PE: General: pleasant, WD, male who is laying in bed in NAD Lungs:  Respiratory effort nonlabored Abd: soft, ND, mild TTP over incisions which are c/d/i MSK: all 4 extremities are symmetrical with no cyanosis, clubbing, or edema. Skin: warm and dry Psych: A&Ox3 with an appropriate affect.    Lab Results:  Recent Labs    08/17/22 0415 08/18/22 0418  WBC 14.0* 12.7*  HGB 12.3* 12.2*  HCT 38.8* 38.3*  PLT 368 402*   BMET Recent Labs    08/17/22 0415 08/18/22 0418  NA 138 139  K 4.9 4.5  CL 100 102  CO2 27 29  GLUCOSE 126* 101*  BUN 23 16  CREATININE 1.03 0.93  CALCIUM 8.9 8.5*   PT/INR Recent Labs    08/17/22 0415 08/18/22 0418  LABPROT 18.2* 17.2*  INR 1.5* 1.4*   CMP     Component Value Date/Time   NA 139 08/18/2022 0418   K 4.5 08/18/2022 0418   CL 102 08/18/2022 0418   CO2 29 08/18/2022 0418   GLUCOSE 101 (H) 08/18/2022 0418   BUN 16 08/18/2022 0418   CREATININE 0.93 08/18/2022 0418   CALCIUM 8.5 (L) 08/18/2022 0418   PROT 6.2 (L) 08/18/2022 0418   ALBUMIN 2.9 (L) 08/18/2022 0418   AST 44 (H) 08/18/2022 0418   ALT 56 (H) 08/18/2022 0418   ALKPHOS 131 (H) 08/18/2022 0418   BILITOT 1.4 (H) 08/18/2022 0418   GFRNONAA TEST NOT PERFORMED 08/18/2022 0418   GFRAA TEST NOT PERFORMED 08/18/2022 0418    Lipase     Component Value Date/Time   LIPASE 154 (H) 08/13/2022 0400       Studies/Results: DG C-Arm 1-60 Min-No Report  Result Date: 08/16/2022 Fluoroscopy was utilized by the requesting physician.  No radiographic interpretation.   DG ERCP  Result Date: 08/16/2022 CLINICAL DATA:  72 year old male with a history of choledocholithiasis EXAM: ERCP TECHNIQUE: Multiple spot images obtained with the fluoroscopic device and submitted for interpretation post-procedure. FLUOROSCOPY: Radiation Exposure Index (as provided by the fluoroscopic device): 172.8 mGy Kerma COMPARISON:  MR 08/12/2022 FINDINGS: Limited intraoperative fluoroscopic spot images during ERCP. Initial image demonstrates the endoscope projecting over the upper abdomen with a safety wire in position. Subsequently there is retrograde infusion of contrast partially opacifying the extrahepatic biliary system. Multiple geometric filling defects present. There is then deployment of a retrieval basket and a retrieval balloon. IMPRESSION: Limited images during ERCP demonstrates treatment of choledocholithiasis. Please refer to the dictated operative report for full details of intraoperative findings and procedure. Electronically Signed   By: Gilmer Mor D.O.   On: 08/16/2022 14:57    Anti-infectives: Anti-infectives (From admission, onward)    Start     Dose/Rate Route Frequency Ordered Stop   08/17/22 1330  ceFAZolin (ANCEF) IVPB 2g/100 mL premix  Status:  Discontinued  2 g 200 mL/hr over 30 Minutes Intravenous On call to O.R. 08/17/22 1314 08/17/22 1553   08/17/22 1314  ceFAZolin (ANCEF) 2-4 GM/100ML-% IVPB  Status:  Discontinued       Note to Pharmacy: Augustine Radar R: cabinet override      08/17/22 1314 08/17/22 1320   08/12/22 2100  Ampicillin-Sulbactam (UNASYN) 3 g in sodium chloride 0.9 % 100 mL IVPB        3 g 200 mL/hr over 30 Minutes Intravenous Every 6 hours 08/12/22 2039     08/12/22 1400  Ampicillin-Sulbactam  (UNASYN) 3 g in sodium chloride 0.9 % 100 mL IVPB        3 g 200 mL/hr over 30 Minutes Intravenous  Once 08/12/22 1349 08/12/22 1506        Assessment/Plan  Cholecystitis, cholelithiasis, choledocholithiasis POD1 s/p lap chole Dr. Gerrit Friends 4/30        - S/p ERCP by Dr. Marca Ancona - WBC and LFTs/WBC improved - can resume anticoagulation from surgical standpoint - tolerating diet  Stable for dc from surgical standpoint    LOS: 6 days   Eric Form, [facility] 08/18/2022, 10:21 AM Please see Amion for pager number during day hours 7:00am-4:30pm

## 2022-08-18 NOTE — Progress Notes (Signed)
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  He was resting comfortably in bed.  Having had laparoscopic cholecystectomy yesterday.  No chest pain or shortness of breath.  Pain is well-controlled.  Passing gas.  No passage of bowel movement.  Tolerating clear liquid diet.  Liver enzyme results reviewed.  Past Medical History:  Diagnosis Date   ALLERGIC RHINITIS    Diabetes mellitus without complication (HCC)    H/O mitral valve replacement    10 yrs ago   HYPERLIPIDEMIA    Hypertension    Long term current use of anticoagulant    MITRAL VALVE PROLAPSE    PREMATURE VENTRICULAR CONTRACTIONS    Past Surgical History:  Procedure Laterality Date   APPENDECTOMY     CHOLECYSTECTOMY N/A 08/17/2022   Procedure: LAPAROSCOPIC CHOLECYSTECTOMY, NO CHOLANGIOGRAM;  Surgeon: [name], MD;  Location: [facility];  Service: General;  Laterality: N/A;   COLONOSCOPY N/A 03/29/2014   Procedure: COLONOSCOPY;  Surgeon: Malissa Hippo, MD;  Location: [facility];  Service: Endoscopy;  Laterality: N/A;  855   COLONOSCOPY N/A 06/01/2019   Rehman: ileum normal, diverticulosis in entire colon, external hemorhoids, no specimens   ESOPHAGOGASTRODUODENOSCOPY N/A 03/29/2014   Procedure: ESOPHAGOGASTRODUODENOSCOPY (EGD);  Surgeon: Malissa Hippo, MD;  Location: [facility];  Service: Endoscopy;  Laterality: N/A;   MITRAL VALVE REPLACEMENT     SHOULDER SURGERY     Current Facility-Administered Medications  Medication Dose Route Frequency Provider Last Rate Last Admin   allopurinol (ZYLOPRIM) tablet 300 mg  300 mg Oral Daily Eric Form, PA-C   300 mg at 08/18/22 1027   Ampicillin-Sulbactam (UNASYN) 3 g in sodium chloride 0.9 % 100 mL IVPB  3 g Intravenous Q6H Eric Form, PA-C 200 mL/hr at 08/18/22 0506 3 g at 08/18/22 1610   Chlorhexidine Gluconate Cloth 2 % PADS 6 each  6 each Topical Once Eric Form,  PA-C       colchicine tablet 0.6 mg  0.6 mg Oral Daily Eric Form, PA-C   0.6 mg at 08/18/22 1027   enoxaparin (LOVENOX) injection 80 mg  80 mg Subcutaneous Q12H Pham, Anh P, RPH       HYDROmorphone (DILAUDID) injection 0.5 mg  0.5 mg Intravenous Q4H PRN Eric Form, PA-C   0.5 mg at 08/15/22 9604   insulin aspart (novoLOG) injection 0-9 Units  0-9 Units Subcutaneous Q4H Eric Form, PA-C   1 Units at 08/18/22 0013   lisinopril (ZESTRIL) tablet 40 mg  40 mg Oral Daily Eric Form, PA-C   40 mg at 08/18/22 1027   melatonin tablet 5 mg  5 mg Oral QHS PRN Eric Form, PA-C   5 mg at 08/12/22 2114   nebivolol (BYSTOLIC) tablet 2.5 mg  2.5 mg Oral Daily Eric Form, PA-C   2.5 mg at 08/18/22 1032   Oral care mouth rinse  15 mL Mouth Rinse PRN Eric Form, PA-C       oxyCODONE (Oxy IR/ROXICODONE) immediate release tablet 5 mg  5 mg Oral Q6H PRN Eric Form, PA-C   5 mg at 08/18/22 0500   pantoprazole (PROTONIX) EC tablet 40 mg  40 mg Oral Daily Eric Form, PA-C   40 mg at 08/18/22 1027   polyethylene glycol (MIRALAX / GLYCOLAX) packet 17 g  17 g Oral Daily PRN Eric Form, PA-C  prochlorperazine (COMPAZINE) injection 5 mg  5 mg Intravenous Q6H PRN Eric Form, PA-C       Allergies as of 08/12/2022 - Review Complete 08/12/2022  Allergen Reaction Noted   Erythromycin Other (See Comments) 02/18/2012   Sulfamethoxazole Other (See Comments) 12/17/2013   Vytorin [ezetimibe-simvastatin] Other (See Comments) 08/12/2022   Review of Systems:  Review of Systems  Respiratory:  Negative for shortness of breath.   Cardiovascular:  Negative for chest pain.  Gastrointestinal:  Negative for abdominal pain.       Passing gas    OBJECTIVE:   Temp:  [97.6 F (36.4 C)-98.5 F (36.9 C)] 98.5 F (36.9 C) (05/01 0436) Pulse Rate:  [69-77] 72 (05/01 0436) Resp:  [9-17] 17 (05/01 0436) BP: (141-177)/(67-95) 141/87 (05/01 0436) SpO2:  [92  %-100 %] 95 % (05/01 0436) Weight:  [80.9 kg] 80.9 kg (05/01 0457) Last BM Date : 08/16/22 Physical Exam Constitutional:      General: He is not in acute distress.    Appearance: He is not ill-appearing, toxic-appearing or diaphoretic.  Cardiovascular:     Rate and Rhythm: Normal rate and regular rhythm.  Pulmonary:     Effort: No respiratory distress.     Breath sounds: Normal breath sounds.  Abdominal:     General: Bowel sounds are normal. There is no distension.     Palpations: Abdomen is soft.     Tenderness: There is no abdominal tenderness. There is no guarding.     Comments: Incisions clean and dry  Neurological:     Mental Status: He is alert.     Labs: Recent Labs    08/16/22 0406 08/17/22 0415 08/18/22 0418  WBC 13.2* 14.0* 12.7*  HGB 12.9* 12.3* 12.2*  HCT 41.8 38.8* 38.3*  PLT 343 368 402*   BMET Recent Labs    08/16/22 0406 08/17/22 0415 08/18/22 0418  NA 138 138 139  K 3.7 4.9 4.5  CL 99 100 102  CO2 26 27 29   GLUCOSE 162* 126* 101*  BUN 15 23 16   CREATININE 0.93 1.03 0.93  CALCIUM 9.6 8.9 8.5*   LFT Recent Labs    08/18/22 0418  PROT 6.2*  ALBUMIN 2.9*  AST 44*  ALT 56*  ALKPHOS 131*  BILITOT 1.4*   PT/INR Recent Labs    08/17/22 0415 08/18/22 0418  LABPROT 18.2* 17.2*  INR 1.5* 1.4*   Diagnostic imaging: DG C-Arm 1-60 Min-No Report  Result Date: 08/16/2022 Fluoroscopy was utilized by the requesting physician.  No radiographic interpretation.   DG ERCP  Result Date: 08/16/2022 CLINICAL DATA:  72 year old male with a history of choledocholithiasis EXAM: ERCP TECHNIQUE: Multiple spot images obtained with the fluoroscopic device and submitted for interpretation post-procedure. FLUOROSCOPY: Radiation Exposure Index (as provided by the fluoroscopic device): 172.8 mGy Kerma COMPARISON:  MR 08/12/2022 FINDINGS: Limited intraoperative fluoroscopic spot images during ERCP. Initial image demonstrates the endoscope projecting over the upper  abdomen with a safety wire in position. Subsequently there is retrograde infusion of contrast partially opacifying the extrahepatic biliary system. Multiple geometric filling defects present. There is then deployment of a retrieval basket and a retrieval balloon. IMPRESSION: Limited images during ERCP demonstrates treatment of choledocholithiasis. Please refer to the dictated operative report for full details of intraoperative findings and procedure. Electronically Signed   By: Gilmer Mor D.O.   On: 08/16/2022 14:57    IMPRESSION: Choledocholithiasis, status post ERCP 08/16/2022 with removal stones Postop day 1 status post laparoscopic cholecystectomy Transaminase elevation  in mixed pattern, improved Diabetes mellitus Hypertension  PLAN: -Patient appears to be doing well post ERCP and post surgical -Okay for diet from GI perspective -Trend liver enzymes -[facility] team will respectfully sign off and be available for any questions that arise   LOS: 6 days   Liliane Shi, [facility]

## 2022-08-18 NOTE — Telephone Encounter (Signed)
Pharmacy Patient Advocate Encounter  Insurance verification completed.    The patient is insured through [employer] Part [employer]   The patient is currently admitted and ran test claims for the following: enoxaparin (Lovenox).  Copays and coinsurance results were relayed to Inpatient clinical team.

## 2022-08-19 LAB — SURGICAL PATHOLOGY

## 2022-08-20 DIAGNOSIS — Z7901 Long term (current) use of anticoagulants: Secondary | ICD-10-CM | POA: Diagnosis not present

## 2022-08-23 ENCOUNTER — Ambulatory Visit: Payer: Medicare PPO | Attending: Cardiovascular Disease | Admitting: *Deleted

## 2022-08-23 DIAGNOSIS — Z5181 Encounter for therapeutic drug level monitoring: Secondary | ICD-10-CM

## 2022-08-23 DIAGNOSIS — Z952 Presence of prosthetic heart valve: Secondary | ICD-10-CM | POA: Diagnosis not present

## 2022-08-23 LAB — POCT INR: INR: 1.3 — AB (ref 2.0–3.0)

## 2022-08-23 MED ORDER — ENOXAPARIN SODIUM 80 MG/0.8ML IJ SOSY: 80.0000 mg | PREFILLED_SYRINGE | Freq: Two times a day (BID) | INTRAMUSCULAR | 0 refills | Status: DC

## 2022-08-23 NOTE — Patient Instructions (Signed)
Take warfarin 2 tablets tonight, 1 1/2 tablets tomorrow night then resume 1 tablet daily  Continue lovenox 80mg  twice daily Recheck in 1 week

## 2022-08-23 NOTE — Anesthesia Postprocedure Evaluation (Signed)
Anesthesia Post Note  Patient: Luis Natal, PhD  Procedure(s) Performed: LAPAROSCOPIC CHOLECYSTECTOMY, NO CHOLANGIOGRAM     Patient location during evaluation: PACU Anesthesia Type: General Level of consciousness: awake and alert Pain management: pain level controlled Vital Signs Assessment: post-procedure vital signs reviewed and stable Respiratory status: spontaneous breathing, nonlabored ventilation, respiratory function stable and patient connected to nasal cannula oxygen Cardiovascular status: blood pressure returned to baseline and stable Postop Assessment: no apparent nausea or vomiting Anesthetic complications: no   No notable events documented.  Last Vitals:  Vitals:   08/18/22 0436 08/18/22 1147  BP: (!) 141/87 111/70  Pulse: 72 80  Resp: 17 16  Temp: 36.9 C 36.9 C  SpO2: 95% 96%    Last Pain:  Vitals:   08/18/22 1147  TempSrc: Oral  PainSc:                  Branson Kranz S

## 2022-08-27 DIAGNOSIS — E1165 Type 2 diabetes mellitus with hyperglycemia: Secondary | ICD-10-CM | POA: Diagnosis not present

## 2022-08-27 DIAGNOSIS — I10 Essential (primary) hypertension: Secondary | ICD-10-CM | POA: Diagnosis not present

## 2022-08-30 ENCOUNTER — Ambulatory Visit: Payer: Medicare PPO | Attending: Cardiovascular Disease | Admitting: *Deleted

## 2022-08-30 DIAGNOSIS — Z952 Presence of prosthetic heart valve: Secondary | ICD-10-CM | POA: Diagnosis not present

## 2022-08-30 DIAGNOSIS — Z961 Presence of intraocular lens: Secondary | ICD-10-CM | POA: Diagnosis not present

## 2022-08-30 DIAGNOSIS — Z5181 Encounter for therapeutic drug level monitoring: Secondary | ICD-10-CM | POA: Diagnosis not present

## 2022-08-30 LAB — POCT INR: INR: 2 (ref 2.0–3.0)

## 2022-08-30 NOTE — Patient Instructions (Signed)
Take warfarin 1 1/2 tablets tonight and tomorrow night then resume 1 tablet daily  Continue lovenox 80mg  twice daily Recheck on Thursday

## 2022-09-02 ENCOUNTER — Ambulatory Visit: Payer: Medicare PPO | Attending: Cardiovascular Disease | Admitting: *Deleted

## 2022-09-02 DIAGNOSIS — Z5181 Encounter for therapeutic drug level monitoring: Secondary | ICD-10-CM

## 2022-09-02 DIAGNOSIS — Z952 Presence of prosthetic heart valve: Secondary | ICD-10-CM | POA: Diagnosis not present

## 2022-09-02 LAB — POCT INR: POC INR: 2.7

## 2022-09-02 NOTE — Patient Instructions (Signed)
Description   Stop lovenox Resume warfarin 1 tablet daily  Recheck INR in 2 weeks.

## 2022-09-03 DIAGNOSIS — R945 Abnormal results of liver function studies: Secondary | ICD-10-CM | POA: Diagnosis not present

## 2022-09-16 ENCOUNTER — Ambulatory Visit: Payer: Medicare PPO | Attending: Cardiovascular Disease | Admitting: *Deleted

## 2022-09-16 DIAGNOSIS — Z5181 Encounter for therapeutic drug level monitoring: Secondary | ICD-10-CM | POA: Diagnosis not present

## 2022-09-16 DIAGNOSIS — Z952 Presence of prosthetic heart valve: Secondary | ICD-10-CM | POA: Diagnosis not present

## 2022-09-16 LAB — POCT INR: POC INR: 1.8

## 2022-09-16 NOTE — Patient Instructions (Signed)
Description   Take 1.5 tablets of warfarin today then START taking warfarin 1 daily except for 1.5 tablets on Fridays. Recheck INR in 1 week.

## 2022-09-23 ENCOUNTER — Ambulatory Visit: Payer: Medicare PPO | Attending: Cardiovascular Disease | Admitting: *Deleted

## 2022-09-23 DIAGNOSIS — Z952 Presence of prosthetic heart valve: Secondary | ICD-10-CM | POA: Diagnosis not present

## 2022-09-23 DIAGNOSIS — Z5181 Encounter for therapeutic drug level monitoring: Secondary | ICD-10-CM

## 2022-09-23 LAB — POCT INR: INR: 1.9 — AB (ref 2.0–3.0)

## 2022-09-23 NOTE — Patient Instructions (Signed)
Take warfarin 2 tablets tonight then increase dose to 1 tablet daily except 1.5 tablets on  Mondays, Wednesdays and Fridays. Recheck INR in 1 week.

## 2022-09-27 DIAGNOSIS — E1165 Type 2 diabetes mellitus with hyperglycemia: Secondary | ICD-10-CM | POA: Diagnosis not present

## 2022-10-04 ENCOUNTER — Ambulatory Visit: Payer: Medicare PPO | Attending: Cardiovascular Disease | Admitting: *Deleted

## 2022-10-04 DIAGNOSIS — Z5181 Encounter for therapeutic drug level monitoring: Secondary | ICD-10-CM | POA: Diagnosis not present

## 2022-10-04 DIAGNOSIS — Z952 Presence of prosthetic heart valve: Secondary | ICD-10-CM | POA: Diagnosis not present

## 2022-10-04 LAB — POCT INR: INR: 2 (ref 2.0–3.0)

## 2022-10-04 NOTE — Patient Instructions (Signed)
Take warfarin 2 tablets tonight then increase dose to 1 1/2 tablet daily except 1 tablet on Saturdays. Recheck INR in 1 week.

## 2022-10-08 ENCOUNTER — Other Ambulatory Visit: Payer: Self-pay | Admitting: Cardiovascular Disease

## 2022-10-13 ENCOUNTER — Ambulatory Visit: Payer: Medicare PPO | Attending: Cardiovascular Disease | Admitting: *Deleted

## 2022-10-13 DIAGNOSIS — H25011 Cortical age-related cataract, right eye: Secondary | ICD-10-CM | POA: Diagnosis not present

## 2022-10-13 DIAGNOSIS — H2511 Age-related nuclear cataract, right eye: Secondary | ICD-10-CM | POA: Diagnosis not present

## 2022-10-13 DIAGNOSIS — Z952 Presence of prosthetic heart valve: Secondary | ICD-10-CM | POA: Diagnosis not present

## 2022-10-13 DIAGNOSIS — H43811 Vitreous degeneration, right eye: Secondary | ICD-10-CM | POA: Diagnosis not present

## 2022-10-13 DIAGNOSIS — Z5181 Encounter for therapeutic drug level monitoring: Secondary | ICD-10-CM

## 2022-10-13 LAB — POCT INR: INR: 2.5 (ref 2.0–3.0)

## 2022-10-13 NOTE — Patient Instructions (Signed)
Continue 1 1/2 tablet daily except 1 tablet on Saturdays. Recheck INR in 2 weeks.

## 2022-10-14 DIAGNOSIS — E559 Vitamin D deficiency, unspecified: Secondary | ICD-10-CM | POA: Diagnosis not present

## 2022-10-14 DIAGNOSIS — R5381 Other malaise: Secondary | ICD-10-CM | POA: Diagnosis not present

## 2022-10-14 DIAGNOSIS — E782 Mixed hyperlipidemia: Secondary | ICD-10-CM | POA: Diagnosis not present

## 2022-10-14 DIAGNOSIS — E1169 Type 2 diabetes mellitus with other specified complication: Secondary | ICD-10-CM | POA: Diagnosis not present

## 2022-10-20 DIAGNOSIS — E782 Mixed hyperlipidemia: Secondary | ICD-10-CM | POA: Diagnosis not present

## 2022-10-20 DIAGNOSIS — K219 Gastro-esophageal reflux disease without esophagitis: Secondary | ICD-10-CM | POA: Diagnosis not present

## 2022-10-20 DIAGNOSIS — K810 Acute cholecystitis: Secondary | ICD-10-CM | POA: Diagnosis not present

## 2022-10-20 DIAGNOSIS — E1169 Type 2 diabetes mellitus with other specified complication: Secondary | ICD-10-CM | POA: Diagnosis not present

## 2022-10-20 DIAGNOSIS — E875 Hyperkalemia: Secondary | ICD-10-CM | POA: Diagnosis not present

## 2022-10-20 DIAGNOSIS — R945 Abnormal results of liver function studies: Secondary | ICD-10-CM | POA: Diagnosis not present

## 2022-10-20 DIAGNOSIS — I10 Essential (primary) hypertension: Secondary | ICD-10-CM | POA: Diagnosis not present

## 2022-10-20 DIAGNOSIS — M1A00X0 Idiopathic chronic gout, unspecified site, without tophus (tophi): Secondary | ICD-10-CM | POA: Diagnosis not present

## 2022-10-27 ENCOUNTER — Ambulatory Visit: Payer: Medicare PPO | Attending: Cardiovascular Disease | Admitting: *Deleted

## 2022-10-27 DIAGNOSIS — E1165 Type 2 diabetes mellitus with hyperglycemia: Secondary | ICD-10-CM | POA: Diagnosis not present

## 2022-10-27 DIAGNOSIS — Z952 Presence of prosthetic heart valve: Secondary | ICD-10-CM | POA: Diagnosis not present

## 2022-10-27 DIAGNOSIS — Z5181 Encounter for therapeutic drug level monitoring: Secondary | ICD-10-CM

## 2022-10-27 LAB — POCT INR: INR: 3.2 — AB (ref 2.0–3.0)

## 2022-10-27 NOTE — Patient Instructions (Signed)
Continue 1 1/2 tablet daily except 1 tablet on Saturdays. Recheck INR in 3 weeks.

## 2022-11-15 ENCOUNTER — Ambulatory Visit (HOSPITAL_COMMUNITY)
Admission: RE | Admit: 2022-11-15 | Discharge: 2022-11-15 | Disposition: A | Discharge status: Home / Self Care | Payer: Medicare PPO | Source: Ambulatory Visit | Attending: Gastroenterology | Admitting: Gastroenterology

## 2022-11-15 ENCOUNTER — Ambulatory Visit: Payer: Medicare PPO | Admitting: Gastroenterology

## 2022-11-15 ENCOUNTER — Encounter: Payer: Self-pay | Admitting: Gastroenterology

## 2022-11-15 VITALS — BP 107/74 | HR 106 | Temp 98.3°F | Ht 73.0 in | Wt 181.6 lb

## 2022-11-15 DIAGNOSIS — R1010 Upper abdominal pain, unspecified: Secondary | ICD-10-CM | POA: Diagnosis not present

## 2022-11-15 DIAGNOSIS — R1011 Right upper quadrant pain: Secondary | ICD-10-CM | POA: Diagnosis not present

## 2022-11-15 DIAGNOSIS — Z9049 Acquired absence of other specified parts of digestive tract: Secondary | ICD-10-CM | POA: Diagnosis not present

## 2022-11-15 NOTE — Patient Instructions (Signed)
We will arrange for you to have an ultrasound at [facility].  Have blood work completed with your primary care doctor.  We will call you with results and further recommendations.  For now, continue to follow more of a bland diet, slowly advancing your diet back to normal as tolerated.  You may try adding protein shakes a couple times a day to supplement your intake.  We will tentatively plan to follow-up in 3 months, but if any of your workup is abnormal or if you have persistent symptoms, we will evaluate further.  It was a pleasure to meet you today! I want to create trusting relationships with patients. If you receive a survey regarding your visit,  I greatly appreciate you taking time to fill this out on paper or through your MyChart. I value your feedback.  Ermalinda Memos, PA-C [facility]

## 2022-11-15 NOTE — Progress Notes (Addendum)
Referring Provider: [name], MD Primary Care Physician:  [name], MD Primary GI Physician: Dr. Levon Hedger  Chief Complaint  Patient presents with   Abdominal Pain    Abdominal pain that has been going on since Sat.     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   Today:  Had been feeling well after cholecystectomy.  Had cereal and banana Saturday morning, then started having upper abdominal pain within 30 minutes.  Started in epigastric area, then spread bilaterally.  Then started vomiting around 1 or 2 PM.   Felt bad the rest of the day.  Yesterday had gingerale and small bowel of chicken noodle soup.  Nausea/vomiting has resolved, but still having some discomfort in the upper abdomen and lack of appetite.  Has some pain when laying down or when getting up to walk around.  Still primarily in the epigastric/RUQ region.  Not as bad as it was initially.  Has left inguinal hernia and has plans for correction in the near future.   No GERD symptoms. No overt GI bleeding.   No sick contacts.  No new medications. No NSAIDs.   Bowels have been moving normally.  No diarrhea.    ERCP 08/16/2022: - The entire main bile duct was markedly dilated.  - Choledocholithiasis was found. Complete removal was accomplished by biliary sphincterotomy and balloon extraction and basket lithotripsy.  - A biliary sphincterotomy was performed.  - The biliary tree was swept.  Last Colonoscopy:06/01/19 - The examined portion of the ileum was normal. - Diverticulosis in the entire examined colon. External hemorrhoids, no specimens - Recommended 10 year repeat.   Past Medical History:  Diagnosis Date   ALLERGIC RHINITIS    Diabetes  mellitus without complication (HCC)    H/O mitral valve replacement    10 yrs ago   HYPERLIPIDEMIA    Hypertension    Long term current use of anticoagulant    MITRAL VALVE PROLAPSE    PREMATURE VENTRICULAR CONTRACTIONS     Past Surgical History:  Procedure Laterality Date   APPENDECTOMY     CHOLECYSTECTOMY N/A 08/17/2022   Procedure: LAPAROSCOPIC CHOLECYSTECTOMY, NO CHOLANGIOGRAM;  Surgeon: [name], MD;  [facility]: [facility];  Service: General;  Laterality: N/A;   COLONOSCOPY N/A 03/29/2014   Procedure: COLONOSCOPY;  Surgeon: Malissa Hippo, MD;  [facility];  Service: Endoscopy;  Laterality: N/A;  855   COLONOSCOPY N/A 06/01/2019   Rehman: ileum normal, diverticulosis in entire colon, external hemorhoids, no specimens   ERCP N/A 08/16/2022   Procedure: ENDOSCOPIC RETROGRADE CHOLANGIOPANCREATOGRAPHY (ERCP);  Surgeon: Kerin Salen, MD;  Location: [facility];  Service: Gastroenterology;  Laterality: N/A;   ESOPHAGOGASTRODUODENOSCOPY N/A 03/29/2014   Procedure: ESOPHAGOGASTRODUODENOSCOPY (EGD);  Surgeon: Malissa Hippo, MD;  [facility];  Service: Endoscopy;  Laterality: N/A;   LITHOTRIPSY  08/16/2022   Procedure: LITHOTRIPSY;  Surgeon: Kerin Salen, MD;  [facility];  Service: Gastroenterology;;   MITRAL VALVE REPLACEMENT     REMOVAL OF STONES  08/16/2022   Procedure: REMOVAL OF STONES;  Surgeon: Kerin Salen, MD;  [facility];  Service: Gastroenterology;;   SHOULDER SURGERY     SPHINCTEROTOMY  08/16/2022   Procedure: Dennison Mascot;  Surgeon: Marca Ancona,  Marcos Eke, MD;  Location: [facility];  Service: Gastroenterology;;    Current Outpatient Medications  Medication Sig Dispense Refill   AFRIN NASAL SPRAY 0.05 % nasal spray Place 1 spray into both nostrils 2 (two) times daily as needed for congestion.     allopurinol (ZYLOPRIM) 300 MG tablet Take 300 mg by mouth daily.     colchicine 0.6 MG tablet Take 0.6 mg by mouth See admin instructions. Take  0.6 mg by mouth once a day as directed for gout flares     enoxaparin (LOVENOX) 80 MG/0.8ML injection Inject 0.8 mLs (80 mg total) into the skin every 12 (twelve) hours for 7 days. Take until your INR is to goal (INR 2.5-3.5).  Follow up with your warfarin clinic for additional recommendations. 8 mL 0   ezetimibe-simvastatin (VYTORIN) 10-20 MG tablet Take 1 tablet by mouth at bedtime.     guaiFENesin (MUCINEX) 600 MG 12 hr tablet Take 600 mg by mouth 2 (two) times daily as needed.     icosapent Ethyl (VASCEPA) 1 g capsule Take 2 g by mouth 2 (two) times daily.     lisinopril (PRINIVIL,ZESTRIL) 40 MG tablet Take 40 mg by mouth daily. Reported on 07/14/2015     metFORMIN (GLUCOPHAGE) 500 MG tablet Take 500 mg by mouth in the morning and at bedtime.     montelukast (SINGULAIR) 10 MG tablet Take 10 mg by mouth in the morning.     nebivolol (BYSTOLIC) 5 MG tablet Take 5 mg by mouth daily.     NEXLETOL 180 MG TABS Take 180 mg by mouth daily.     omeprazole (PRILOSEC) 40 MG capsule TAKE 1 CAPSULE(40 MG) BY MOUTH DAILY (Patient taking differently: Take 40 mg by mouth daily before breakfast.) 90 capsule 2   OVER THE COUNTER MEDICATION Take 1-2 tablets by mouth See admin instructions. TYLENOL Sinus + Headache Non-Drowsy Daytime Caplets for Nasal Congestion, Sinus Pressure & Pain Relief- Take 1-2 caplets by mouth every six hours as needed for migraines     polyethylene glycol (MIRALAX) 17 g packet Take 17 g by mouth daily. Until your bowel movements return to normal (Patient taking differently: Take 17 g by mouth daily as needed. Until your bowel movements return to normal) 14 each 0   Psyllium (METAMUCIL PO) Take 4 capsules by mouth daily with breakfast.     RYBELSUS 14 MG TABS Take 14 mg by mouth daily.     Vitamin D, Ergocalciferol, (DRISDOL) 1.25 MG (50000 UNIT) CAPS capsule Take 50,000 Units by mouth once a week.     warfarin (COUMADIN) 5 MG tablet TAKE 1-2 TABLETS BY MOUTH EVERY DAY OR AS DIRECTED BY  COUMADIN CLINIC 45 tablet 5   No current facility-administered medications for this visit.    Allergies as of 11/15/2022 - Review Complete 11/15/2022  Allergen Reaction Noted   Erythromycin Other (See Comments) 02/18/2012   Sulfamethoxazole Other (See Comments) 12/17/2013   Vytorin [ezetimibe-simvastatin] Other (See Comments) 08/12/2022    History reviewed. No pertinent family history.  Social History   Socioeconomic History   Marital status: Married    Spouse name: Not on file   Number of children: Not on file   Years of education: Not on file   Highest education level: Not on file  Occupational History   Not on file  Tobacco Use   Smoking status: Never   Smokeless tobacco: Never  Vaping Use   Vaping status: Never Used  Substance and Sexual Activity  Alcohol use: Yes    Comment: occ   Drug use: No   Sexual activity: Not on file  Other Topics Concern   Not on file  Social History Narrative   Not on file   Social Determinants of Health   Financial Resource Strain: Not on file  Food Insecurity: No Food Insecurity (08/12/2022)   Hunger Vital Sign    Worried About Running Out of Food in the Last Year: Never true    Ran Out of Food in the Last Year: Never true  Transportation Needs: No Transportation Needs (08/12/2022)   PRAPARE - Administrator, Civil Service (Medical): No    Lack of Transportation (Non-Medical): No  Physical Activity: Not on file  Stress: Not on file  Social Connections: Not on file    Review of Systems: Gen: Denies fever, chills, cold or flulike symptoms, presyncope, syncope. CV: Denies chest pain, palpitations. Resp: Denies dyspnea, cough. GI: See HPI Heme: See HPI  Physical Exam: BP 107/74 (BP Location: Right Arm, Patient Position: Sitting, Cuff Size: Normal)   Pulse (!) 106   Temp 98.3 F (36.8 C) (Oral)   Ht 6\' 1"  (1.854 m)   Wt 181 lb 9.6 oz (82.4 kg)   SpO2 95%   BMI 23.96 kg/m  General:   Alert and oriented. No  distress noted. Pleasant and cooperative.  Head:  Normocephalic and atraumatic. Eyes:  Conjuctiva clear without scleral icterus. Heart:  S1, S2 present without murmurs appreciated. Lungs:  Clear to auscultation bilaterally. No wheezes, rales, or rhonchi. No distress.  Abdomen:  +BS, soft, and non-distended. No rebound or guarding. No HSM or masses noted. Msk:  Symmetrical without gross deformities. Normal posture. Extremities:  Without edema. Neurologic:  Alert and  oriented x4 Psych:  Normal mood and affect.    Assessment:  72 y.o. male with past medical history of  HLD, HTN, mitral valve prolapse, s/p mitral valve replacement, GERD, admitted to [facility] in April 2024 with choledocholithiasis s/p ERCP and laparoscopic cholecystectomy, presenting today with chief complaint of upper abdominal pain.   Upper abdominal pain: Acute onset 2 days ago with associated nausea and vomiting.  Nausea/vomiting resolved, but continues with mild abdominal pain, primarily epigastric and RUQ region, as well as lack of appetite.  Denies NSAIDs, overt GI bleeding, or change in bowel habits.  Chronically on omeprazole 40 mg daily.  Suspect patient likely suffered acute gastroenteritis, but unable to rule out biliary etiology, gastritis, duodenitis, PUD, H. pylori, pancreatitis.    Recommended updating labs and obtaining RUQ ultrasound.  If workup is unrevealing and symptoms continue to improve, no additional workup is needed.  If he has persistent symptoms, would recommend pursuing upper endoscopy for further evaluation.   Plan:  CBC, CMP, lipase RUQ ultrasound Continue advancing diet back to normal as tolerated. Continue omeprazole 40 mg daily. Tentatively plan for follow-up in 3 months.  However, if workup is abnormal or if patient has persistent symptoms, will evaluate further.   Ermalinda Memos, PA-C [facility] 11/15/2022   I have reviewed the note and agree with the APP's  assessment as described in this progress note  Katrinka Blazing, MD Gastroenterology and Hepatology [facility]

## 2022-11-16 DIAGNOSIS — R100 Acute abdomen: Secondary | ICD-10-CM | POA: Diagnosis not present

## 2022-11-17 ENCOUNTER — Ambulatory Visit: Payer: Medicare PPO | Attending: Cardiovascular Disease | Admitting: *Deleted

## 2022-11-17 ENCOUNTER — Other Ambulatory Visit (HOSPITAL_COMMUNITY)
Admission: RE | Admit: 2022-11-17 | Discharge: 2022-11-17 | Disposition: A | Discharge status: Home / Self Care | Payer: Medicare PPO | Source: Ambulatory Visit | Attending: Cardiovascular Disease | Admitting: Cardiovascular Disease

## 2022-11-17 DIAGNOSIS — Z952 Presence of prosthetic heart valve: Secondary | ICD-10-CM | POA: Diagnosis not present

## 2022-11-17 DIAGNOSIS — Z5181 Encounter for therapeutic drug level monitoring: Secondary | ICD-10-CM | POA: Diagnosis not present

## 2022-11-17 LAB — POCT INR: INR: 8 — AB (ref 2.0–3.0)

## 2022-11-17 LAB — PROTIME-INR
INR: 5.2 (ref 0.8–1.2)
Prothrombin Time: 47.9 seconds — ABNORMAL HIGH (ref 11.4–15.2)

## 2022-11-17 NOTE — Patient Instructions (Signed)
Stomach issues x 5 days.  Feeling better today. POC INR 8.0  Sent to [facility] for STAT PT/INR  Result 5.2 Hold warfarin tonight and tomorrow night then decrease dose to 1 1/2 tablet daily except 1 tablet on Tuesdays and Saturdays. Recheck INR in 1 week Denies S/S of bleeding or excessive bruising.  Bleeding and fall precautions discussed with pt and he verbalized understanding.

## 2022-11-18 ENCOUNTER — Other Ambulatory Visit (HOSPITAL_COMMUNITY): Payer: Self-pay | Admitting: Family Medicine

## 2022-11-18 DIAGNOSIS — M542 Cervicalgia: Secondary | ICD-10-CM

## 2022-11-21 ENCOUNTER — Telehealth: Payer: Self-pay | Admitting: Gastroenterology

## 2022-11-21 NOTE — Telephone Encounter (Signed)
Please call patient and let him know we received his lab results from 7/30. WBC count is mildly elevated at 14.7. This is non-specific at this time. His hemoglobin, platelets, kidney function, electrolytes, liver enzymes, and lipase all wnl.   I also reviewed the stools smears on toilet tissue he brought to the office. While the color may have been a little more dark brown than he is used to, the stool did not appear black, so this is nothing to worry about.   How is he feeling? Is he still having abdominal pain? Any fever, nausea, vomiting, diarrhea, urinary symptoms, other cold or flu like symptoms?

## 2022-11-22 ENCOUNTER — Ambulatory Visit (HOSPITAL_COMMUNITY)
Admission: RE | Admit: 2022-11-22 | Discharge: 2022-11-22 | Disposition: A | Discharge status: Home / Self Care | Payer: Medicare PPO | Source: Ambulatory Visit | Attending: Family Medicine | Admitting: Family Medicine

## 2022-11-22 ENCOUNTER — Encounter: Payer: Self-pay | Admitting: *Deleted

## 2022-11-22 DIAGNOSIS — M5030 Other cervical disc degeneration, unspecified cervical region: Secondary | ICD-10-CM | POA: Diagnosis not present

## 2022-11-22 DIAGNOSIS — M542 Cervicalgia: Secondary | ICD-10-CM | POA: Insufficient documentation

## 2022-11-22 DIAGNOSIS — M47812 Spondylosis without myelopathy or radiculopathy, cervical region: Secondary | ICD-10-CM | POA: Diagnosis not present

## 2022-11-22 NOTE — Telephone Encounter (Signed)
LMOM for pt to call office. Sent pt a MyChart message also.

## 2022-11-24 DIAGNOSIS — M9901 Segmental and somatic dysfunction of cervical region: Secondary | ICD-10-CM | POA: Diagnosis not present

## 2022-11-24 DIAGNOSIS — M9902 Segmental and somatic dysfunction of thoracic region: Secondary | ICD-10-CM | POA: Diagnosis not present

## 2022-11-24 DIAGNOSIS — M546 Pain in thoracic spine: Secondary | ICD-10-CM | POA: Diagnosis not present

## 2022-11-24 DIAGNOSIS — M542 Cervicalgia: Secondary | ICD-10-CM | POA: Diagnosis not present

## 2022-11-27 DIAGNOSIS — E1165 Type 2 diabetes mellitus with hyperglycemia: Secondary | ICD-10-CM | POA: Diagnosis not present

## 2022-11-29 ENCOUNTER — Ambulatory Visit: Payer: Medicare PPO | Attending: Cardiovascular Disease | Admitting: *Deleted

## 2022-11-29 DIAGNOSIS — Z952 Presence of prosthetic heart valve: Secondary | ICD-10-CM

## 2022-11-29 DIAGNOSIS — Z5181 Encounter for therapeutic drug level monitoring: Secondary | ICD-10-CM

## 2022-11-29 DIAGNOSIS — M9901 Segmental and somatic dysfunction of cervical region: Secondary | ICD-10-CM | POA: Diagnosis not present

## 2022-11-29 DIAGNOSIS — M542 Cervicalgia: Secondary | ICD-10-CM | POA: Diagnosis not present

## 2022-11-29 DIAGNOSIS — M9902 Segmental and somatic dysfunction of thoracic region: Secondary | ICD-10-CM | POA: Diagnosis not present

## 2022-11-29 DIAGNOSIS — M546 Pain in thoracic spine: Secondary | ICD-10-CM | POA: Diagnosis not present

## 2022-11-29 LAB — POCT INR: INR: 3.9 — AB (ref 2.0–3.0)

## 2022-11-29 NOTE — Patient Instructions (Signed)
Hold warfarin tonight then decrease dose to 1 1/2 tablets daily except 1 tablet on Tuesdays, Thursdays and Saturdays Recheck in 2 wks

## 2022-12-08 DIAGNOSIS — M9901 Segmental and somatic dysfunction of cervical region: Secondary | ICD-10-CM | POA: Diagnosis not present

## 2022-12-08 DIAGNOSIS — M9902 Segmental and somatic dysfunction of thoracic region: Secondary | ICD-10-CM | POA: Diagnosis not present

## 2022-12-08 DIAGNOSIS — M542 Cervicalgia: Secondary | ICD-10-CM | POA: Diagnosis not present

## 2022-12-08 DIAGNOSIS — M546 Pain in thoracic spine: Secondary | ICD-10-CM | POA: Diagnosis not present

## 2022-12-10 DIAGNOSIS — M546 Pain in thoracic spine: Secondary | ICD-10-CM | POA: Diagnosis not present

## 2022-12-10 DIAGNOSIS — M9902 Segmental and somatic dysfunction of thoracic region: Secondary | ICD-10-CM | POA: Diagnosis not present

## 2022-12-10 DIAGNOSIS — M542 Cervicalgia: Secondary | ICD-10-CM | POA: Diagnosis not present

## 2022-12-10 DIAGNOSIS — M9901 Segmental and somatic dysfunction of cervical region: Secondary | ICD-10-CM | POA: Diagnosis not present

## 2022-12-11 NOTE — Progress Notes (Unsigned)
Referring Provider: [name], MD Primary Care Physician:  [name], MD Primary GI Physician: Dr. Levon Hedger  No chief complaint on file.   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  Patient was last seen in our office 11/15/2022 reporting that he felt well after cholecystectomy, but then developed upper abdominal pain after eating breakfast (cereal and banana) 2 days prior to presenting to the office.  Reported pain started in the epigastric area then spread bilaterally.  Developed vomiting later in the same day.  Nausea/vomiting resolved, but still having some discomfort in the upper abdomen and lack of appetite.  No lower GI issues.  Denied NSAIDs sick contacts, new medications.  Recommended updating labs, RUQ ultrasound.  If workup unrevealing and symptoms continued to improve, no additional workup would be needed.  If persistent symptoms, would need to pursue upper endoscopy.  Labs completed 7/30.  WBC was mildly elevated at 14.2, platelets, kidney function, electrolytes, liver enzymes, lipase all within normal limits.  RUQ [place] 11/15/22 with evidence of prior cholecystectomy, no acute process.   Today:     ERCP 08/16/2022: - The entire main bile duct was markedly dilated.  - Choledocholithiasis was found. Complete removal was accomplished by biliary sphincterotomy and balloon extraction and basket lithotripsy.  - A biliary sphincterotomy was performed.  - The biliary tree was swept.   Last Colonoscopy:06/01/19 - The examined portion of the ileum was normal. - Diverticulosis in the entire examined colon. External hemorrhoids, no specimens - Recommended 10 year repeat.   Past Medical History:  Diagnosis Date    ALLERGIC RHINITIS    Diabetes mellitus without complication (HCC)    H/O mitral valve replacement    10 yrs ago   HYPERLIPIDEMIA    Hypertension    Long term current use of anticoagulant    MITRAL VALVE PROLAPSE    PREMATURE VENTRICULAR CONTRACTIONS     Past Surgical History:  Procedure Laterality Date   APPENDECTOMY     CHOLECYSTECTOMY N/A 08/17/2022   Procedure: LAPAROSCOPIC CHOLECYSTECTOMY, NO CHOLANGIOGRAM;  Surgeon: [name], MD;  Location: [facility];  Service: General;  Laterality: N/A;   COLONOSCOPY N/A 03/29/2014   Procedure: COLONOSCOPY;  Surgeon: Malissa Hippo, MD;  [facility];  Service: Endoscopy;  Laterality: N/A;  855   COLONOSCOPY N/A 06/01/2019   Rehman: ileum normal, diverticulosis in entire colon, external hemorhoids, no specimens   ERCP N/A 08/16/2022   Procedure: ENDOSCOPIC RETROGRADE CHOLANGIOPANCREATOGRAPHY (ERCP);  Surgeon: Kerin Salen, MD;  Location: [facility];  Service: Gastroenterology;  Laterality: N/A;   ESOPHAGOGASTRODUODENOSCOPY N/A 03/29/2014   Procedure: ESOPHAGOGASTRODUODENOSCOPY (EGD);  Surgeon: Malissa Hippo, MD;  [facility];  Service: Endoscopy;  Laterality: N/A;   LITHOTRIPSY  08/16/2022   Procedure: LITHOTRIPSY;  Surgeon: Kerin Salen, MD;  [facility]: [facility];  Service: Gastroenterology;;   MITRAL VALVE REPLACEMENT     REMOVAL OF STONES  08/16/2022   Procedure: REMOVAL OF STONES;  Surgeon: Kerin Salen, MD;  Location: [facility];  Service: Gastroenterology;;   SHOULDER SURGERY     SPHINCTEROTOMY  08/16/2022   Procedure: Dennison Mascot;  Surgeon: Kerin Salen, MD;  [facility]: [facility];  Service: Gastroenterology;;    Current Outpatient Medications  Medication Sig Dispense  Refill   AFRIN NASAL SPRAY 0.05 % nasal spray Place 1 spray into both nostrils 2 (two) times daily as needed for congestion.     allopurinol (ZYLOPRIM) 300 MG tablet Take 300 mg by mouth daily.     colchicine 0.6 MG tablet Take 0.6 mg by  mouth See admin instructions. Take 0.6 mg by mouth once a day as directed for gout flares     enoxaparin (LOVENOX) 80 MG/0.8ML injection Inject 0.8 mLs (80 mg total) into the skin every 12 (twelve) hours for 7 days. Take until your INR is to goal (INR 2.5-3.5).  Follow up with your warfarin clinic for additional recommendations. 8 mL 0   ezetimibe-simvastatin (VYTORIN) 10-20 MG tablet Take 1 tablet by mouth at bedtime.     guaiFENesin (MUCINEX) 600 MG 12 hr tablet Take 600 mg by mouth 2 (two) times daily as needed.     icosapent Ethyl (VASCEPA) 1 g capsule Take 2 g by mouth 2 (two) times daily.     lisinopril (PRINIVIL,ZESTRIL) 40 MG tablet Take 40 mg by mouth daily. Reported on 07/14/2015     metFORMIN (GLUCOPHAGE) 500 MG tablet Take 500 mg by mouth in the morning and at bedtime.     montelukast (SINGULAIR) 10 MG tablet Take 10 mg by mouth in the morning.     nebivolol (BYSTOLIC) 5 MG tablet Take 5 mg by mouth daily.     NEXLETOL 180 MG TABS Take 180 mg by mouth daily.     omeprazole (PRILOSEC) 40 MG capsule TAKE 1 CAPSULE(40 MG) BY MOUTH DAILY (Patient taking differently: Take 40 mg by mouth daily before breakfast.) 90 capsule 2   OVER THE COUNTER MEDICATION Take 1-2 tablets by mouth See admin instructions. TYLENOL Sinus + Headache Non-Drowsy Daytime Caplets for Nasal Congestion, Sinus Pressure & Pain Relief- Take 1-2 caplets by mouth every six hours as needed for migraines     polyethylene glycol (MIRALAX) 17 g packet Take 17 g by mouth daily. Until your bowel movements return to normal (Patient taking differently: Take 17 g by mouth daily as needed. Until your bowel movements return to normal) 14 each 0   Psyllium (METAMUCIL PO) Take 4 capsules by mouth daily with breakfast.     RYBELSUS 14 MG TABS Take 14 mg by mouth daily.     Vitamin D, Ergocalciferol, (DRISDOL) 1.25 MG (50000 UNIT) CAPS capsule Take 50,000 Units by mouth once a week.     warfarin (COUMADIN) 5 MG tablet TAKE 1-2 TABLETS BY  MOUTH EVERY DAY OR AS DIRECTED BY COUMADIN CLINIC 45 tablet 5   No current facility-administered medications for this visit.    Allergies as of 12/13/2022 - Review Complete 11/15/2022  Allergen Reaction Noted   Erythromycin Other (See Comments) 02/18/2012   Sulfamethoxazole Other (See Comments) 12/17/2013   Vytorin [ezetimibe-simvastatin] Other (See Comments) 08/12/2022    No family history on file.  Social History   Socioeconomic History   Marital status: Married    Spouse name: Not on file   Number of children: Not on file   Years of education: Not on file   Highest education level: Not on file  Occupational History   Not on file  Tobacco Use   Smoking status: Never   Smokeless tobacco: Never  Vaping Use   Vaping status: Never Used  Substance and Sexual Activity   Alcohol use: Yes    Comment: occ   Drug use: No   Sexual activity: Not on file  Other Topics Concern   Not on file  Social History Narrative   Not on file   Social Determinants of Health   Financial Resource Strain: Not on file  Food Insecurity: No Food Insecurity (08/12/2022)   Hunger Vital Sign    Worried About Running Out of Food in the Last Year: Never true    Ran Out of Food in the Last Year: Never true  Transportation Needs: No Transportation Needs (08/12/2022)   PRAPARE - Administrator, Civil Service (Medical): No    Lack of Transportation (Non-Medical): No  Physical Activity: Not on file  Stress: Not on file  Social Connections: Not on file    Review of Systems: Gen: Denies fever, chills, anorexia. Denies fatigue, weakness, weight loss.  CV: Denies chest pain, palpitations, syncope, peripheral edema, and claudication. Resp: Denies dyspnea at rest, cough, wheezing, coughing up blood, and pleurisy. GI: Denies vomiting blood, jaundice, and fecal incontinence.   Denies dysphagia or odynophagia. Derm: Denies rash, itching, dry skin Psych: Denies depression, anxiety, memory loss,  confusion. No homicidal or suicidal ideation.  Heme: Denies bruising, bleeding, and enlarged lymph nodes.  Physical Exam: There were no vitals taken for this visit. General:   Alert and oriented. No distress noted. Pleasant and cooperative.  Head:  Normocephalic and atraumatic. Eyes:  Conjuctiva clear without scleral icterus. Heart:  S1, S2 present without murmurs appreciated. Lungs:  Clear to auscultation bilaterally. No wheezes, rales, or rhonchi. No distress.  Abdomen:  +BS, soft, non-tender and non-distended. No rebound or guarding. No HSM or masses noted. Msk:  Symmetrical without gross deformities. Normal posture. Extremities:  Without edema. Neurologic:  Alert and  oriented x4 Psych:  Normal mood and affect.    Assessment:     Plan:  ***   Ermalinda Memos, PA-C [facility]enter[facility] 12/13/2022

## 2022-12-13 ENCOUNTER — Encounter: Payer: Self-pay | Admitting: Gastroenterology

## 2022-12-13 ENCOUNTER — Ambulatory Visit: Payer: Medicare PPO | Admitting: Gastroenterology

## 2022-12-13 VITALS — BP 138/88 | HR 89 | Temp 97.9°F | Ht 73.0 in | Wt 170.4 lb

## 2022-12-13 DIAGNOSIS — K5900 Constipation, unspecified: Secondary | ICD-10-CM

## 2022-12-13 DIAGNOSIS — Z7984 Long term (current) use of oral hypoglycemic drugs: Secondary | ICD-10-CM | POA: Diagnosis not present

## 2022-12-13 DIAGNOSIS — R634 Abnormal weight loss: Secondary | ICD-10-CM | POA: Diagnosis not present

## 2022-12-13 DIAGNOSIS — Z713 Dietary counseling and surveillance: Secondary | ICD-10-CM | POA: Diagnosis not present

## 2022-12-13 DIAGNOSIS — M542 Cervicalgia: Secondary | ICD-10-CM | POA: Diagnosis not present

## 2022-12-13 DIAGNOSIS — Z6822 Body mass index (BMI) 22.0-22.9, adult: Secondary | ICD-10-CM | POA: Diagnosis not present

## 2022-12-13 DIAGNOSIS — E1169 Type 2 diabetes mellitus with other specified complication: Secondary | ICD-10-CM | POA: Diagnosis not present

## 2022-12-13 DIAGNOSIS — R6881 Early satiety: Secondary | ICD-10-CM

## 2022-12-13 DIAGNOSIS — E11649 Type 2 diabetes mellitus with hypoglycemia without coma: Secondary | ICD-10-CM | POA: Diagnosis not present

## 2022-12-13 NOTE — Patient Instructions (Signed)
As we discussed, your sensation of getting full quickly, lack of appetite, and weight loss could be related to Rybelsus, but we can complete an upper endoscopy for you for further evaluation if you would like.  After you follow-up with your primary care doctor this afternoon, please let me know what you decide regarding the upper endoscopy.  In general, I recommend that you try eating 4-6 small meals daily and drink 1-2 protein shakes daily to help maintain your weight.  Your mild intermittent constipation may be related to decreased oral intake as well as Rybelsus. Continue taking Metamucil daily.  You can follow the instructions on the container and take the maximum dose. If needed, you can take MiraLAX 17 g (1 capful) every day in 8 ounces of water or other noncarbonated beverage of your choice.  I will plan to see you back in the office in 6 weeks unless we go ahead and proceed with upper endoscopy, then we will follow-up after your procedure.  It was great to see you today!   Ermalinda Memos, PA-C [facility]astroenterology

## 2022-12-14 ENCOUNTER — Ambulatory Visit: Payer: Medicare PPO | Attending: Cardiovascular Disease | Admitting: *Deleted

## 2022-12-14 DIAGNOSIS — Z5181 Encounter for therapeutic drug level monitoring: Secondary | ICD-10-CM

## 2022-12-14 DIAGNOSIS — Z952 Presence of prosthetic heart valve: Secondary | ICD-10-CM | POA: Diagnosis not present

## 2022-12-14 LAB — POCT INR: INR: 4.8 — AB (ref 2.0–3.0)

## 2022-12-14 NOTE — Patient Instructions (Signed)
Hold warfarin tonight, take 1/2 tablet tomorrow night then decrease dose to 1 tablet daily except 1 1/2 tablets on Mondays and Thursdays Recheck in 10 days

## 2022-12-15 ENCOUNTER — Telehealth: Payer: Self-pay | Admitting: Gastroenterology

## 2022-12-15 NOTE — Telephone Encounter (Signed)
Received the following update from patient: "I met yesterday afternoon with my PCP. He wants me to back away from Rybelsus. We will monitor my glucose levels using my body monitor system as well as my weight. At this time, he's inclined not to do the procedure recommended by [facility], but let's keep that open in the future if needed. I am ok with this approach. Hope you will be, too. Thank you! "  I have agreed with this plan. We will follow-up in 6 weeks as planned to see how he is going.    Mandy:  I just saw patient in the office 12/13/2022 and requested a 6-week follow-up with me.  Not sure why scheduled with Doylene Bode, NP.  Can you please change his appointment so that he has follow-up with me in 6 weeks? Please notify him of this appointment change.

## 2022-12-21 ENCOUNTER — Telehealth: Payer: Self-pay | Admitting: *Deleted

## 2022-12-21 NOTE — Telephone Encounter (Signed)
I have sent a message back to the patient recommending office visit for further evaluation as he was not having these symptoms when I saw him in the office last.

## 2022-12-21 NOTE — Telephone Encounter (Signed)
Pt came into office wanting to be seen. He states he has a lot of abdominal pain and pressure. I informed him you were at the hospital today. He states he would like to have something for the discomfort in his stomach. He states I sent a MyChart message earlier and was waiting for someone  to respond to him. I informed him again you were at the hospital and it was busy, but you would send him a message as soon as possible.  I forwarded a MyChart message to you earlier.

## 2022-12-22 ENCOUNTER — Encounter: Payer: Self-pay | Admitting: Gastroenterology

## 2022-12-22 ENCOUNTER — Telehealth: Payer: Self-pay | Admitting: *Deleted

## 2022-12-22 ENCOUNTER — Encounter: Payer: Self-pay | Admitting: *Deleted

## 2022-12-22 ENCOUNTER — Ambulatory Visit: Payer: Medicare PPO | Admitting: Gastroenterology

## 2022-12-22 VITALS — BP 123/75 | HR 90 | Temp 98.1°F | Ht 73.0 in | Wt 173.8 lb

## 2022-12-22 DIAGNOSIS — K4090 Unilateral inguinal hernia, without obstruction or gangrene, not specified as recurrent: Secondary | ICD-10-CM | POA: Diagnosis not present

## 2022-12-22 DIAGNOSIS — Z952 Presence of prosthetic heart valve: Secondary | ICD-10-CM | POA: Diagnosis not present

## 2022-12-22 DIAGNOSIS — R1032 Left lower quadrant pain: Secondary | ICD-10-CM

## 2022-12-22 DIAGNOSIS — Z7901 Long term (current) use of anticoagulants: Secondary | ICD-10-CM | POA: Diagnosis not present

## 2022-12-22 DIAGNOSIS — R1010 Upper abdominal pain, unspecified: Secondary | ICD-10-CM | POA: Diagnosis not present

## 2022-12-22 NOTE — Telephone Encounter (Signed)
[employer] PA for CT: Approved Authorization #[id number]  Tracking #[id number] Dates of service 12/22/2022 - 02/20/2023

## 2022-12-22 NOTE — Patient Instructions (Signed)
Please have blood work completed at [facility].  We will arrange to have a CT scan of your abdomen and pelvis.  We are going to try to arrange this for Friday.  If you have any worsening abdominal pain between now and then, please let me know and we will arrange stat imaging.  Further recommendations to follow.  Ermalinda Memos, PA-C [facility]astroenterology

## 2022-12-22 NOTE — Progress Notes (Addendum)
Referring Provider: [name], MD Primary Care Physician:  [name], MD Primary GI Physician: Dr. Levon Hedger  Chief Complaint  Patient presents with   Abdominal Pain    Abdominal pain and pressure in the middle of stomach.     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   Patient was previously seen in the office 11/15/2022 reporting acute onset upper abdominal pain with nausea and vomiting that lasted for couple of days, then spontaneously resolved. RUQ [place] 11/15/22 with evidence of prior cholecystectomy, no acute process.Labs were updated on 7/30 showing WBC was mildly elevated at 14.2, platelets, kidney function, electrolytes, liver enzymes, lipase all within normal limits.  He was last seen in our office 12/13/2022.  Reported no recurrent upper abdominal pain, nausea, vomiting.  GERD was well-controlled on omeprazole 40 mg daily.  He did report having lack of appetite, eating smaller portions, and not snacking like he used to.  He also reported some mild intermittent constipation, but bowels were moving well at the time of his office visit.  Reported starting Rybelsus 6 or 8 months ago for diabetes, but dose was increased 4-5 months ago.  Reported his weight has been trending down since his cholecystectomy in April.  Per chart review, noted 34 pound weight loss since December 2023, 11 pound weight loss in the last month.  Discussed that Rybelsus could be contributing to early satiety and weight loss, but offered EGD to evaluate his symptoms further.  Patient preferred to discuss Rybelsus with PCP first and would let us know if he would like to pursue EGD.  For constipation, advised to continue  Metamucil and use MiraLAX as needed.  Today:  Had been doing well after stopping Rybelsus. Appetite has improved.  Was eating very well. Yesterday at 7:30 a.m., had 2 boiled eggs and english muffin. Developed discomfort across the upper abdomen, nausea, and 2 episodes of vomiting without hematemesis.  Notes mild increase in abdominal pain when he gets up to walk around, take deep breath, or with cough, he has increasing discomfort.  He was able to tolerate 3 soda crackers and ginger ale last night.  No further nausea or vomiting today, but abdominal discomfort continues.  Bowels moving well. No brbpr, melena, or hematemesis.   No alcohol in about 1.5 weeks.  No regular NSAID use.   No fever, chills.     Past Medical History:  Diagnosis Date   ALLERGIC RHINITIS    Diabetes mellitus without complication (HCC)    H/O mitral valve replacement    10 yrs ago   HYPERLIPIDEMIA    Hypertension    Long term current use of anticoagulant    MITRAL VALVE PROLAPSE    PREMATURE VENTRICULAR CONTRACTIONS     Past Surgical History:  Procedure Laterality Date   APPENDECTOMY     CHOLECYSTECTOMY N/A 08/17/2022   Procedure: LAPAROSCOPIC CHOLECYSTECTOMY, NO CHOLANGIOGRAM;  Surgeon: [name], MD;  Location: [facility];  Service: General;  Laterality: N/A;   COLONOSCOPY N/A 03/29/2014   Procedure: COLONOSCOPY;  Surgeon: Malissa Hippo, MD;  Location: [facility];  Service: Endoscopy;  Laterality: N/A;  855   COLONOSCOPY N/A 06/01/2019   Rehman: ileum normal, diverticulosis  in entire colon, external hemorhoids, no specimens   ERCP N/A 08/16/2022   Procedure: ENDOSCOPIC RETROGRADE CHOLANGIOPANCREATOGRAPHY (ERCP);  Surgeon: Kerin Salen, MD;  Location: [facility];  Service: Gastroenterology;  Laterality: N/A;   ESOPHAGOGASTRODUODENOSCOPY N/A 03/29/2014   Procedure: ESOPHAGOGASTRODUODENOSCOPY (EGD);  Surgeon: Malissa Hippo, MD;  Location: [facility];  Service: Endoscopy;  Laterality: N/A;    LITHOTRIPSY  08/16/2022   Procedure: LITHOTRIPSY;  Surgeon: Kerin Salen, MD;  Location: [facility];  Service: Gastroenterology;;   MITRAL VALVE REPLACEMENT     REMOVAL OF STONES  08/16/2022   Procedure: REMOVAL OF STONES;  Surgeon: Kerin Salen, MD;  Location: [facility];  Service: Gastroenterology;;   SHOULDER SURGERY     SPHINCTEROTOMY  08/16/2022   Procedure: Dennison Mascot;  Surgeon: Kerin Salen, MD;  Location: [facility];  Service: Gastroenterology;;    Current Outpatient Medications  Medication Sig Dispense Refill   AFRIN NASAL SPRAY 0.05 % nasal spray Place 1 spray into both nostrils 2 (two) times daily as needed for congestion.     allopurinol (ZYLOPRIM) 300 MG tablet Take 300 mg by mouth daily.     colchicine 0.6 MG tablet Take 0.6 mg by mouth See admin instructions. Take 0.6 mg by mouth once a day as directed for gout flares     ezetimibe-simvastatin (VYTORIN) 10-20 MG tablet Take 1 tablet by mouth at bedtime.     guaiFENesin (MUCINEX) 600 MG 12 hr tablet Take 600 mg by mouth 2 (two) times daily as needed.     icosapent Ethyl (VASCEPA) 1 g capsule Take 2 g by mouth 2 (two) times daily.     lisinopril (PRINIVIL,ZESTRIL) 40 MG tablet Take 40 mg by mouth daily. Reported on 07/14/2015     metFORMIN (GLUCOPHAGE) 500 MG tablet Take 500 mg by mouth in the morning and at bedtime.     montelukast (SINGULAIR) 10 MG tablet Take 10 mg by mouth in the morning.     nebivolol (BYSTOLIC) 5 MG tablet Take 5 mg by mouth daily.     NEXLETOL 180 MG TABS Take 180 mg by mouth daily.     omeprazole (PRILOSEC) 40 MG capsule TAKE 1 CAPSULE(40 MG) BY MOUTH DAILY (Patient taking differently: Take 40 mg by mouth daily before breakfast.) 90 capsule 2   OVER THE COUNTER MEDICATION Take 1-2 tablets by mouth See admin instructions. TYLENOL Sinus + Headache Non-Drowsy Daytime Caplets for Nasal Congestion, Sinus Pressure & Pain Relief- Take 1-2 caplets by mouth every six hours as needed for migraines     Psyllium  (METAMUCIL PO) Take 4 capsules by mouth daily with breakfast.     Vitamin D, Ergocalciferol, (DRISDOL) 1.25 MG (50000 UNIT) CAPS capsule Take 50,000 Units by mouth once a week.     warfarin (COUMADIN) 5 MG tablet TAKE 1-2 TABLETS BY MOUTH EVERY DAY OR AS DIRECTED BY COUMADIN CLINIC 45 tablet 5   No current facility-administered medications for this visit.    Allergies as of 12/22/2022 - Review Complete 12/22/2022  Allergen Reaction Noted   Erythromycin Other (See Comments) 02/18/2012   Sulfamethoxazole Other (See Comments) 12/17/2013   Vytorin [ezetimibe-simvastatin] Other (See Comments) 08/12/2022    History reviewed. No pertinent family history.  Social History   Socioeconomic History   Marital status: Married    Spouse name: Not on file   Number of children: Not on file   Years of education: Not on file   Highest education level: Not on file  Occupational History   Not  on file  Tobacco Use   Smoking status: Never   Smokeless tobacco: Never  Vaping Use   Vaping status: Never Used  Substance and Sexual Activity   Alcohol use: Yes    Comment: occ   Drug use: No   Sexual activity: Not on file  Other Topics Concern   Not on file  Social History Narrative   Not on file   Social Determinants of Health   Financial Resource Strain: Not on file  Food Insecurity: No Food Insecurity (08/12/2022)   Hunger Vital Sign    Worried About Running Out of Food in the Last Year: Never true    Ran Out of Food in the Last Year: Never true  Transportation Needs: No Transportation Needs (08/12/2022)   PRAPARE - Administrator, Civil Service (Medical): No    Lack of Transportation (Non-Medical): No  Physical Activity: Not on file  Stress: Not on file  Social Connections: Not on file    Review of Systems: Gen: Denies fever, chills, cold or flulike symptoms, presyncope, syncope. CV: Denies chest pain, palpitations. Resp: Denies dyspnea, cough. GI: See HPI Heme: See  HPI  Physical Exam: BP 123/75 (BP Location: Left Arm, Patient Position: Sitting, Cuff Size: Normal)   Pulse 90   Temp 98.1 F (36.7 C) (Temporal)   Ht 6\' 1"  (1.854 m)   Wt 173 lb 12.8 oz (78.8 kg)   SpO2 96%   BMI 22.93 kg/m  General:   Alert and oriented. No distress noted. Pleasant and cooperative.  Head:  Normocephalic and atraumatic. Eyes:  Conjuctiva clear without scleral icterus. Heart:  S1, S2 present without murmurs appreciated. Lungs:  Clear to auscultation bilaterally. No wheezes, rales, or rhonchi. No distress.  Abdomen:  +BS, soft, and non-distended.  Mild generalized tenderness palpation, moderate tenderness in the left lower quadrant.  No rebound or guarding. No HSM or masses noted. Msk:  Symmetrical without gross deformities. Normal posture. Extremities:  Without edema. Neurologic:  Alert and  oriented x4 Psych:  Normal mood and affect.    Assessment:  72 y.o. male  with past medical history of  HLD, HTN, mitral valve prolapse, s/p mitral valve replacement, GERD, admitted to [facility] in April 2024 with choledocholithiasis s/p ERCP and laparoscopic cholecystectomy, presenting today with chief complaint of recurrent upper abdominal pain with nausea/vomiting.  Patient previously reported 48-hour episode of acute onset upper abdominal pain, nausea/vomiting when I saw him in the office on 7/29.  Labs were updated at that time along with RUQ ultrasound, all of which were unrevealing aside from mildly elevated WBC count of 14.2.  Ultimately, all symptoms resolved though he had some persistent early satiety which ultimately resolved after discontinuing Rybelsus on 8/26.  Now with recurrent upper abdominal pain, nausea/vomiting yesterday.  No further nausea today, but abdominal pain continues.  Denies starting any new medications, recent NSAID or alcohol consumption.  GERD is well-controlled on omeprazole daily.  Interestingly, on exam, his pain is primarily in the left lower  quadrant. No rebound or guarding.   I have recommended updating labs and obtaining a CT of his abdomen pelvis for further evaluation.  If CT is unrevealing, would consider proceeding with upper endoscopy as this is his second occurrence of upper abdominal pain, nausea/vomiting of unknown etiology.   Plan:  CBC, CMP, lipase CT A/P with contrast ASAP Further recommendations to follow.   Ermalinda Memos, PA-C [facility] Gastroenterology 12/22/2022   I have reviewed the note and agree  with the APP's assessment as described in this progress note  Katrinka Blazing, MD Gastroenterology and Hepatology [facility] Gastroenterology

## 2022-12-22 NOTE — Telephone Encounter (Signed)
Noted. Pt has OV for 12/22/2022

## 2022-12-22 NOTE — Telephone Encounter (Signed)
   Pre-operative Risk Assessment    Patient Name: Luis Lycett, PhD  DOB: [birth year] MRN: [id number]   DATE OF LAST VISIT: 02/22/22 DR. NISHAN DATE OF NEXT VISIT: 02/28/23 DR. NISHAN  Request for Surgical Clearance    Procedure:   INGUINAL HERNIA SURGERY  Date of Surgery:  Clearance TBD                                 Surgeon:  DR. Feliciana Rossetti Surgeon's Group or Practice Name:  [facility] Phone number:  [phone] Fax number:  [phone] ATTN: Santiago Glad, CMA   Type of Clearance Requested:   - Medical  - Pharmacy:  Hold Warfarin (Coumadin)     Type of Anesthesia:  General    Additional requests/questions:    Elpidio Anis   12/22/2022, 12:42 PM

## 2022-12-23 DIAGNOSIS — H25011 Cortical age-related cataract, right eye: Secondary | ICD-10-CM | POA: Diagnosis not present

## 2022-12-23 DIAGNOSIS — H25811 Combined forms of age-related cataract, right eye: Secondary | ICD-10-CM | POA: Diagnosis not present

## 2022-12-23 DIAGNOSIS — H2511 Age-related nuclear cataract, right eye: Secondary | ICD-10-CM | POA: Diagnosis not present

## 2022-12-23 LAB — CMP14+EGFR
ALT: 16 IU/L (ref 0–44)
AST: 19 IU/L (ref 0–40)
Albumin: 4.3 g/dL (ref 3.8–4.8)
Alkaline Phosphatase: 58 IU/L (ref 44–121)
BUN/Creatinine Ratio: 24 (ref 10–24)
BUN: 24 mg/dL (ref 8–27)
Bilirubin Total: 0.7 mg/dL (ref 0.0–1.2)
CO2: 25 mmol/L (ref 20–29)
Calcium: 10.1 mg/dL (ref 8.6–10.2)
Chloride: 98 mmol/L (ref 96–106)
Creatinine, Ser: 1.01 mg/dL (ref 0.76–1.27)
Globulin, Total: 2.2 g/dL (ref 1.5–4.5)
Glucose: 118 mg/dL — ABNORMAL HIGH (ref 70–99)
Potassium: 5.5 mmol/L — ABNORMAL HIGH (ref 3.5–5.2)
Sodium: 140 mmol/L (ref 134–144)
Total Protein: 6.5 g/dL (ref 6.0–8.5)
eGFR: 79 mL/min/{1.73_m2} (ref >59)

## 2022-12-23 LAB — CBC WITH DIFFERENTIAL/PLATELET
Basophils Absolute: 0 10*3/uL (ref 0.0–0.2)
Basos: 0 %
EOS (ABSOLUTE): 0.1 10*3/uL (ref 0.0–0.4)
Eos: 0 %
Hematocrit: 46.3 % (ref 37.5–51.0)
Hemoglobin: 14.9 g/dL (ref 13.0–17.7)
Immature Grans (Abs): 0 10*3/uL (ref 0.0–0.1)
Immature Granulocytes: 0 %
Lymphocytes Absolute: 2.4 10*3/uL (ref 0.7–3.1)
Lymphs: 13 %
MCH: 28.3 pg (ref 26.6–33.0)
MCHC: 32.2 g/dL (ref 31.5–35.7)
MCV: 88 fL (ref 79–97)
Monocytes Absolute: 1.1 10*3/uL — ABNORMAL HIGH (ref 0.1–0.9)
Monocytes: 6 %
Neutrophils Absolute: 14.4 10*3/uL — ABNORMAL HIGH (ref 1.4–7.0)
Neutrophils: 81 %
Platelets: 443 10*3/uL (ref 150–450)
RBC: 5.26 x10E6/uL (ref 4.14–5.80)
RDW: 14.5 % (ref 11.6–15.4)
WBC: 18.1 10*3/uL — ABNORMAL HIGH (ref 3.4–10.8)

## 2022-12-23 LAB — LIPASE: Lipase: 289 U/L — ABNORMAL HIGH (ref 13–78)

## 2022-12-23 NOTE — Telephone Encounter (Signed)
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    Procedure: inguinal hernia repair Date of procedure: TBD  CrCl 10mL/min Platelet count 443K  Per office protocol, patient can hold warfarin for 5 days prior to procedure. Patient will need bridging with Lovenox (enoxaparin) around procedure. This can be coordinated at [facility] Coumadin clinic where pt is followed.  **This guidance is not considered finalized until pre-operative APP has relayed final recommendations.**

## 2022-12-23 NOTE — Telephone Encounter (Signed)
Clearance noted.  Will wait for procedure to be scheduled to arrange Lovenox bridge.

## 2022-12-23 NOTE — Telephone Encounter (Signed)
   Name: Luis Toppi, PhD  DOB: 07-13-1950  MRN: [id number]  Primary Cardiologist: [name], MD   Preoperative team, please contact this patient and set up a phone call appointment for further preoperative risk assessment. Please obtain consent and complete medication review. Thank you for your help.  I confirm that guidance regarding antiplatelet and oral anticoagulation therapy has been completed and, if necessary, noted below.  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     Procedure: inguinal hernia repair Date of procedure: TBD   CrCl 42mL/min Platelet count 443K   Per office protocol, patient can hold warfarin for 5 days prior to procedure. Patient will need bridging with Lovenox (enoxaparin) around procedure. This can be coordinated at [facility] Coumadin clinic where pt is followed.   **This guidance is not considered finalized until pre-operative APP has relayed final recommendations.Joni Reining, NP 12/23/2022, 8:03 AM [facility]

## 2022-12-24 ENCOUNTER — Ambulatory Visit (HOSPITAL_COMMUNITY)
Admission: RE | Admit: 2022-12-24 | Discharge: 2022-12-24 | Disposition: A | Discharge status: Home / Self Care | Payer: Medicare PPO | Source: Ambulatory Visit | Attending: Gastroenterology

## 2022-12-24 DIAGNOSIS — K5750 Diverticulosis of both small and large intestine without perforation or abscess without bleeding: Secondary | ICD-10-CM | POA: Diagnosis not present

## 2022-12-24 DIAGNOSIS — R1010 Upper abdominal pain, unspecified: Secondary | ICD-10-CM | POA: Diagnosis not present

## 2022-12-24 DIAGNOSIS — K859 Acute pancreatitis without necrosis or infection, unspecified: Secondary | ICD-10-CM | POA: Diagnosis not present

## 2022-12-24 DIAGNOSIS — R1032 Left lower quadrant pain: Secondary | ICD-10-CM | POA: Diagnosis not present

## 2022-12-24 DIAGNOSIS — N281 Cyst of kidney, acquired: Secondary | ICD-10-CM | POA: Diagnosis not present

## 2022-12-24 MED ORDER — SODIUM CHLORIDE (PF) 0.9 % IJ SOLN
INTRAMUSCULAR | Status: AC
  Filled 2022-12-24: qty 50

## 2022-12-24 MED ORDER — IOHEXOL 300 MG/ML  SOLN
100.0000 mL | Freq: Once | INTRAMUSCULAR | Status: AC | PRN
  Administered 2022-12-24: 100 mL via INTRAVENOUS

## 2022-12-24 NOTE — Telephone Encounter (Signed)
1st attempt at scheduling tele preop clearance. [email or web address]

## 2022-12-24 NOTE — Telephone Encounter (Signed)
Routed incorrectly. Sending to Pre-op call back

## 2022-12-27 ENCOUNTER — Ambulatory Visit: Payer: Medicare PPO | Attending: Cardiovascular Disease | Admitting: *Deleted

## 2022-12-27 ENCOUNTER — Other Ambulatory Visit: Payer: Self-pay | Admitting: Gastroenterology

## 2022-12-27 DIAGNOSIS — K859 Acute pancreatitis without necrosis or infection, unspecified: Secondary | ICD-10-CM

## 2022-12-27 DIAGNOSIS — Z5181 Encounter for therapeutic drug level monitoring: Secondary | ICD-10-CM

## 2022-12-27 DIAGNOSIS — Z952 Presence of prosthetic heart valve: Secondary | ICD-10-CM | POA: Diagnosis not present

## 2022-12-27 LAB — POCT INR: INR: 3.6 — AB (ref 2.0–3.0)

## 2022-12-27 NOTE — Telephone Encounter (Signed)
Pt stated that his procedure will be moved to the end of November 2024. I suggested that he could get clearance from Dr. Eden Emms on 02/28/23. He agreed. I updated appt notes and will send this clearance to Dr. Eden Emms for review.

## 2022-12-27 NOTE — Patient Instructions (Signed)
Dx Acute pancreatitis.  Pt to call if started on any new meds. Decrease warfarin to 1 tablet daily Recheck in 10 days Pending hernia repair probably in November

## 2022-12-28 DIAGNOSIS — E1165 Type 2 diabetes mellitus with hyperglycemia: Secondary | ICD-10-CM | POA: Diagnosis not present

## 2022-12-29 DIAGNOSIS — K859 Acute pancreatitis without necrosis or infection, unspecified: Secondary | ICD-10-CM | POA: Diagnosis not present

## 2022-12-31 LAB — CBC WITH DIFFERENTIAL/PLATELET
Basophils Absolute: 0.1 10*3/uL (ref 0.0–0.2)
Basos: 1 %
EOS (ABSOLUTE): 0.5 10*3/uL — ABNORMAL HIGH (ref 0.0–0.4)
Eos: 5 %
Hematocrit: 42.9 % (ref 37.5–51.0)
Hemoglobin: 13.5 g/dL (ref 13.0–17.7)
Immature Grans (Abs): 0.2 10*3/uL — ABNORMAL HIGH (ref 0.0–0.1)
Immature Granulocytes: 2 %
Lymphocytes Absolute: 1.9 10*3/uL (ref 0.7–3.1)
Lymphs: 20 %
MCH: 28.5 pg (ref 26.6–33.0)
MCHC: 31.5 g/dL (ref 31.5–35.7)
MCV: 91 fL (ref 79–97)
Monocytes Absolute: 0.5 10*3/uL (ref 0.1–0.9)
Monocytes: 5 %
Neutrophils Absolute: 6.3 10*3/uL (ref 1.4–7.0)
Neutrophils: 67 %
Platelets: 496 10*3/uL — ABNORMAL HIGH (ref 150–450)
RBC: 4.74 x10E6/uL (ref 4.14–5.80)
RDW: 14.2 % (ref 11.6–15.4)
WBC: 9.5 10*3/uL (ref 3.4–10.8)

## 2022-12-31 LAB — TRIGLYCERIDES: Triglycerides: 475 mg/dL — ABNORMAL HIGH (ref 0–149)

## 2022-12-31 LAB — IGG 4: IgG, Subclass 4: 48 mg/dL (ref 2–96)

## 2023-01-10 ENCOUNTER — Ambulatory Visit: Payer: Medicare PPO | Attending: Cardiovascular Disease | Admitting: *Deleted

## 2023-01-10 DIAGNOSIS — Z5181 Encounter for therapeutic drug level monitoring: Secondary | ICD-10-CM | POA: Diagnosis not present

## 2023-01-10 DIAGNOSIS — Z952 Presence of prosthetic heart valve: Secondary | ICD-10-CM

## 2023-01-10 LAB — POCT INR: INR: 2.2 (ref 2.0–3.0)

## 2023-01-10 NOTE — Patient Instructions (Signed)
Increase warfarin to 1 tablet daily except 1 1/2 tablets on Mondays and Thursdays Recheck in 2 wks Pending hernia repair probably in November

## 2023-01-23 NOTE — Progress Notes (Unsigned)
Referring Provider: [name], MD Primary Care Physician:  [name], MD Primary GI Physician: Dr. Levon Hedger  Chief Complaint  Patient presents with   Follow-up    Follow up. No problems      HPI:   Luis Walkins, Luis Haynes is a 72 y.o. male with GI history significant for GERD, admitted to [facility] in April 2024 with choledocholithiasis s/p ERCP and laparoscopic cholecystectomy, he did well after that until July when he developed an episode of upper abdominal pain/nausea/vomiting that spontaneously resolved after 48 hours, but had recurrent symptoms on 9/3 and ultimately found to have pancreatitis.  He is presenting today for follow-up.  He last presented to our office on 9/4.  Reported recurrent upper abdominal pain with nausea/vomiting yesterday.  No further nausea at the time of his office visit, but pain continued.  He had recently discontinued Rybelsus on 8/26.  Rybelsus initially started about 8 months prior with dose increased about 4 months prior, but ultimately discontinued as this was causing early satiety.  Otherwise, he denied new medications, recent NSAID use or alcohol consumption.  GERD was controlled on omeprazole daily.  On exam, he had primarily LLQ abdominal pain.  Recommended CT for further evaluation and if negative, consider EGD.  Labs showed a lipase of 289, LFTs within normal limits, white blood cell count elevated at 18.1.  CT 12/24/22 with acute pancreatitis without complication.  Mild diverticulosis without diverticulitis.  Also with 1.5 cm apparent nodular density in the second portion of the duodenum, most likely representing a fold.  No lesion was seen in this area on MRI in April 2024.  Attention on follow-up imaging recommended.  At that time, patient reported he was feeling very well.  Recommended checking triglycerides, IgG4, updating CBC to ensure leukocytosis improving as this was suspected to be reactive from pancreatitis.  Also recommended repeating  CT pancreatic protocol in about 6 weeks.  Labs completed 12/29/2022: Triglycerides were elevated at 475, IgG4 within normal limits, leukocytosis resolved.   Today: Reports he is actually been feeling normal for the last 6 weeks.  All prior GI symptoms have resolved.  Denies abdominal pain, nausea, vomiting.  GERD is well-controlled on omeprazole 40 mg daily.  Bowels are moving well without BRBPR or melena.  States he will be out of town the week of October 21.  He is going to [place] for vacation.   Past Medical History:  Diagnosis Date   ALLERGIC RHINITIS    Diabetes mellitus without complication (HCC)    H/O mitral valve replacement    10 yrs ago   HYPERLIPIDEMIA    Hypertension    Long term current use of anticoagulant    MITRAL VALVE PROLAPSE    PREMATURE VENTRICULAR CONTRACTIONS     Past Surgical History:  Procedure Laterality Date   APPENDECTOMY     CHOLECYSTECTOMY N/A 08/17/2022   Procedure: LAPAROSCOPIC CHOLECYSTECTOMY, NO CHOLANGIOGRAM;  Surgeon: [name], MD;  Location: [facility];  Service: General;  Laterality: N/A;   COLONOSCOPY N/A 03/29/2014   Procedure: COLONOSCOPY;  Surgeon: Malissa Hippo, MD;  Location: [facility];  Service: Endoscopy;  Laterality: N/A;  855   COLONOSCOPY N/A 06/01/2019   Rehman: ileum normal, diverticulosis in entire colon, external hemorhoids, no specimens   ERCP N/A 08/16/2022   Procedure: ENDOSCOPIC RETROGRADE CHOLANGIOPANCREATOGRAPHY (ERCP);  Surgeon: Kerin Salen, MD;  Location: [facility];  Service: Gastroenterology;  Laterality: N/A;   ESOPHAGOGASTRODUODENOSCOPY N/A 03/29/2014   Procedure: ESOPHAGOGASTRODUODENOSCOPY (EGD);  Surgeon: Malissa Hippo, MD;  Location: [facility];  Service: Endoscopy;  Laterality: N/A;   LITHOTRIPSY  08/16/2022   Procedure: LITHOTRIPSY;  Surgeon: Kerin Salen, MD;  Location: [facility];  Service: Gastroenterology;;   MITRAL VALVE REPLACEMENT     REMOVAL OF STONES  08/16/2022   Procedure: REMOVAL OF  STONES;  Surgeon: Kerin Salen, MD;  Location: [facility];  Service: Gastroenterology;;   SHOULDER SURGERY     SPHINCTEROTOMY  08/16/2022   Procedure: Dennison Mascot;  Surgeon: Kerin Salen, MD;  Location: [facility];  Service: Gastroenterology;;    Current Outpatient Medications  Medication Sig Dispense Refill   AFRIN NASAL SPRAY 0.05 % nasal spray Place 1 spray into both nostrils 2 (two) times daily as needed for congestion.     allopurinol (ZYLOPRIM) 300 MG tablet Take 300 mg by mouth daily.     colchicine 0.6 MG tablet Take 0.6 mg by mouth See admin instructions. Take 0.6 mg by mouth once a day as directed for gout flares     ezetimibe-simvastatin (VYTORIN) 10-20 MG tablet Take 1 tablet by mouth at bedtime.     guaiFENesin (MUCINEX) 600 MG 12 hr tablet Take 600 mg by mouth 2 (two) times daily as needed.     icosapent Ethyl (VASCEPA) 1 g capsule Take 2 g by mouth 2 (two) times daily.     lisinopril (PRINIVIL,ZESTRIL) 40 MG tablet Take 40 mg by mouth daily. Reported on 07/14/2015     metFORMIN (GLUCOPHAGE) 500 MG tablet Take 500 mg by mouth in the morning and at bedtime.     montelukast (SINGULAIR) 10 MG tablet Take 10 mg by mouth in the morning.     nebivolol (BYSTOLIC) 5 MG tablet Take 5 mg by mouth daily.     NEXLETOL 180 MG TABS Take 180 mg by mouth daily.     omeprazole (PRILOSEC) 40 MG capsule TAKE 1 CAPSULE(40 MG) BY MOUTH DAILY (Patient taking differently: Take 40 mg by mouth daily before breakfast.) 90 capsule 2   OVER THE COUNTER MEDICATION Take 1-2 tablets by mouth See admin instructions. TYLENOL Sinus + Headache Non-Drowsy Daytime Caplets for Nasal Congestion, Sinus Pressure & Pain Relief- Take 1-2 caplets by mouth every six hours as needed for migraines     Psyllium (METAMUCIL PO) Take 4 capsules by mouth daily with breakfast.     Vitamin D, Ergocalciferol, (DRISDOL) 1.25 MG (50000 UNIT) CAPS capsule Take 50,000 Units by mouth once a week.     warfarin (COUMADIN) 5 MG tablet  TAKE 1-2 TABLETS BY MOUTH EVERY DAY OR AS DIRECTED BY COUMADIN CLINIC 45 tablet 5   No current facility-administered medications for this visit.    Allergies as of 01/26/2023 - Review Complete 01/26/2023  Allergen Reaction Noted   Erythromycin Other (See Comments) 02/18/2012   Sulfamethoxazole Other (See Comments) 12/17/2013   Vytorin [ezetimibe-simvastatin] Other (See Comments) 08/12/2022    History reviewed. No pertinent family history.  Social History   Socioeconomic History   Marital status: Married    Spouse name: Not on file   Number of children: Not on file   Years of education: Not on file   Highest education level: Not on file  Occupational History   Not on file  Tobacco Use   Smoking status: Never   Smokeless tobacco: Never  Vaping Use   Vaping status: Never Used  Substance and Sexual Activity   Alcohol use: Yes    Comment: occ   Drug use: No  Sexual activity: Not on file  Other Topics Concern   Not on file  Social History Narrative   Not on file   Social Determinants of Health   Financial Resource Strain: Not on file  Food Insecurity: No Food Insecurity (08/12/2022)   Hunger Vital Sign    Worried About Running Out of Food in the Last Year: Never true    Ran Out of Food in the Last Year: Never true  Transportation Needs: No Transportation Needs (08/12/2022)   PRAPARE - Administrator, Civil Service (Medical): No    Lack of Transportation (Non-Medical): No  Physical Activity: Not on file  Stress: Not on file  Social Connections: Not on file    Review of Systems: Gen: Denies fever, chills, cold or flulike symptoms, pre-syncope, or syncope.  CV: Denies chest pain, palpitations. Resp: Denies dyspnea, cough.  GI: See HPI Heme: See HPI  Physical Exam: BP 114/63 (BP Location: Right Arm, Patient Position: Sitting, Cuff Size: Normal)   Pulse 75   Temp 97.7 F (36.5 C) (Temporal)   Ht 6\' 1"  (1.854 m)   Wt 188 lb 6.4 oz (85.5 kg)   SpO2  95%   BMI 24.86 kg/m  General:   Alert and oriented. No distress noted. Pleasant and cooperative.  Head:  Normocephalic and atraumatic. Eyes:  Conjuctiva clear without scleral icterus. Heart:  S1, S2 present without murmurs appreciated. Lungs:  Clear to auscultation bilaterally. No wheezes, rales, or rhonchi. No distress.  Abdomen:  +BS, soft, non-tender and non-distended. No rebound or guarding. No HSM or masses noted. Msk:  Symmetrical without gross deformities. Normal posture. Extremities:  Without edema. Neurologic:  Alert and  oriented x4 Psych:  Normal mood and affect.    Assessment:  72 y.o. male with GI history significant for GERD, admitted to [facility] in April 2024 with choledocholithiasis s/p ERCP and laparoscopic cholecystectomy, he did well after that until July when he developed an episode of upper abdominal pain/nausea/vomiting that spontaneously resolved after 48 hours, but had recurrent symptoms on 9/3 and ultimately found to have pancreatitis.  He is presenting today for follow-up.   History of Pancreatitis:  Found to have acute uncomplicated pancreatitis on CT A/P with contrast 12/24/2022.  I suspect pancreatitis was secondary to Rybelsus though this was discontinued a few days prior to pancreatitis onset.  His labs showed normal LFTs, triglycerides elevated at 475, IgG4 within normal limits.  He did have some leukocytosis, likely reactive in the setting of pancreatitis which resolved on follow-up labs.  Clinically, he is doing very well reporting all prior GI symptoms including abdominal pain, nausea, vomiting, early satiety have completely resolved.  He had also previously been losing some weight with Rybelsus which was not desired and has since gained some weight back which she is very pleased about.  We are planning for repeat CT in a few weeks to ensure no underlying pancreatic lesion contributing to acute pancreatitis.  Abnormal CT duodenum: Incidentally noted 1.5 cm  apparent nodular density in the second portion of the duodenum on CT 12/24/2022.  This was most likely representing a fold, but recommended attention to follow-up on imaging.  We are planning for repeat CT in a few weeks.  GERD: Well-controlled on omeprazole 40 mg daily.    Plan:  CT abdomen with pancreatic protocol week of October 28th or after.  Continue omeprazole 40 mg daily. Follow-up in 6 months or sooner if needed.   Ermalinda Memos, PA-C [facility] Gastroenterology  01/26/2023  I have reviewed the note and agree with the APP's assessment as described in this progress note  Will notify PCP about most recent results of triglycerides, he will need to follow-up with PCP regarding management of this.  Katrinka Blazing, MD Gastroenterology and Hepatology [facility] Gastroenterology

## 2023-01-25 ENCOUNTER — Ambulatory Visit: Payer: Medicare PPO | Attending: Cardiovascular Disease | Admitting: *Deleted

## 2023-01-25 DIAGNOSIS — Z5181 Encounter for therapeutic drug level monitoring: Secondary | ICD-10-CM

## 2023-01-25 DIAGNOSIS — Z952 Presence of prosthetic heart valve: Secondary | ICD-10-CM | POA: Diagnosis not present

## 2023-01-25 LAB — POCT INR: INR: 2.4 (ref 2.0–3.0)

## 2023-01-25 NOTE — Patient Instructions (Signed)
Increase warfarin to 1 tablet daily except 1 1/2 tablets on Tuesdays, Thursdays and Saturdays Recheck in 3 wks Pending hernia repair probably in November

## 2023-01-26 ENCOUNTER — Encounter: Payer: Self-pay | Admitting: Gastroenterology

## 2023-01-26 ENCOUNTER — Encounter: Payer: Self-pay | Admitting: *Deleted

## 2023-01-26 ENCOUNTER — Telehealth: Payer: Self-pay | Admitting: *Deleted

## 2023-01-26 ENCOUNTER — Ambulatory Visit: Payer: Medicare PPO | Admitting: Gastroenterology

## 2023-01-26 VITALS — BP 114/63 | HR 75 | Temp 97.7°F | Ht 73.0 in | Wt 188.4 lb

## 2023-01-26 DIAGNOSIS — R933 Abnormal findings on diagnostic imaging of other parts of digestive tract: Secondary | ICD-10-CM

## 2023-01-26 DIAGNOSIS — Z8719 Personal history of other diseases of the digestive system: Secondary | ICD-10-CM | POA: Diagnosis not present

## 2023-01-26 DIAGNOSIS — K219 Gastro-esophageal reflux disease without esophagitis: Secondary | ICD-10-CM

## 2023-01-26 DIAGNOSIS — E1169 Type 2 diabetes mellitus with other specified complication: Secondary | ICD-10-CM | POA: Diagnosis not present

## 2023-01-26 DIAGNOSIS — E782 Mixed hyperlipidemia: Secondary | ICD-10-CM | POA: Diagnosis not present

## 2023-01-26 DIAGNOSIS — M1A00X0 Idiopathic chronic gout, unspecified site, without tophus (tophi): Secondary | ICD-10-CM | POA: Diagnosis not present

## 2023-01-26 NOTE — Telephone Encounter (Signed)
[facility] PA for CT:  Approved Authorization #[id number]  Tracking #[id number] Dates of service 01/26/2023 - 03/27/2023

## 2023-01-26 NOTE — Patient Instructions (Signed)
We will get you scheduled for a CT in the next few weeks to follow-up on history of pancreatitis.  Continue omeprazole daily to control acid reflux.  We will plan to follow-up with you in 6 months or sooner if needed.  It was great to see you again today!  I am glad you are feeling much better!  Ermalinda Memos, PA-C [facility] Gastroenterology

## 2023-01-26 NOTE — Telephone Encounter (Signed)
CT scheduled for 03/07/23, arrive at 4:30 pm to check in at [facility]. No solid food 4 hours prior. MyChart message sent to pt

## 2023-01-26 NOTE — Telephone Encounter (Signed)
noted 

## 2023-01-27 ENCOUNTER — Ambulatory Visit (INDEPENDENT_AMBULATORY_CARE_PROVIDER_SITE_OTHER): Payer: Medicare PPO | Admitting: Gastroenterology

## 2023-01-27 DIAGNOSIS — E1165 Type 2 diabetes mellitus with hyperglycemia: Secondary | ICD-10-CM | POA: Diagnosis not present

## 2023-02-03 DIAGNOSIS — Z8042 Family history of malignant neoplasm of prostate: Secondary | ICD-10-CM | POA: Diagnosis not present

## 2023-02-03 DIAGNOSIS — N138 Other obstructive and reflux uropathy: Secondary | ICD-10-CM | POA: Diagnosis not present

## 2023-02-03 DIAGNOSIS — N401 Enlarged prostate with lower urinary tract symptoms: Secondary | ICD-10-CM | POA: Diagnosis not present

## 2023-02-03 DIAGNOSIS — R972 Elevated prostate specific antigen [PSA]: Secondary | ICD-10-CM | POA: Diagnosis not present

## 2023-02-15 ENCOUNTER — Ambulatory Visit: Payer: Medicare PPO | Attending: Cardiovascular Disease | Admitting: *Deleted

## 2023-02-15 DIAGNOSIS — Z5181 Encounter for therapeutic drug level monitoring: Secondary | ICD-10-CM

## 2023-02-15 DIAGNOSIS — Z952 Presence of prosthetic heart valve: Secondary | ICD-10-CM | POA: Diagnosis not present

## 2023-02-15 LAB — POCT INR: INR: 3 (ref 2.0–3.0)

## 2023-02-15 NOTE — Patient Instructions (Signed)
Continue warfarin 1 tablet daily except 1 1/2 tablets on Tuesdays, Thursdays and Saturdays Recheck in 4 wks Pending hernia repair probably in November

## 2023-02-16 DIAGNOSIS — R634 Abnormal weight loss: Secondary | ICD-10-CM | POA: Diagnosis not present

## 2023-02-16 DIAGNOSIS — I10 Essential (primary) hypertension: Secondary | ICD-10-CM | POA: Diagnosis not present

## 2023-02-16 DIAGNOSIS — Z23 Encounter for immunization: Secondary | ICD-10-CM | POA: Diagnosis not present

## 2023-02-16 DIAGNOSIS — M542 Cervicalgia: Secondary | ICD-10-CM | POA: Diagnosis not present

## 2023-02-16 DIAGNOSIS — R945 Abnormal results of liver function studies: Secondary | ICD-10-CM | POA: Diagnosis not present

## 2023-02-16 DIAGNOSIS — K810 Acute cholecystitis: Secondary | ICD-10-CM | POA: Diagnosis not present

## 2023-02-16 DIAGNOSIS — E1169 Type 2 diabetes mellitus with other specified complication: Secondary | ICD-10-CM | POA: Diagnosis not present

## 2023-02-16 DIAGNOSIS — E162 Hypoglycemia, unspecified: Secondary | ICD-10-CM | POA: Diagnosis not present

## 2023-02-16 DIAGNOSIS — E11649 Type 2 diabetes mellitus with hypoglycemia without coma: Secondary | ICD-10-CM | POA: Diagnosis not present

## 2023-02-22 NOTE — Progress Notes (Signed)
Patient ID: Luis Natal, PhD, male   DOB: 04-04-1951, 72 y.o.   MRN: [id number]     Luis Haynes is seen today in followup for his hypertension and mitral  valve replacement.   INR;s have been Rx  There's been no bleeding diathesis. He's been following his SBE prophylaxis. He's been compliant with his blood pressure medication. Otherwise he's not had any significant chest pain PND or orthopnea. Has been no TIA or CVA. Has been no signs of heart failure Has ? venous insuficiency with pain in LE;s that has been markedly improved with support hose   Echo 08/15/20 EF 60-65% St Jude valve normal trivial MR diastolic gradient 2 mmHg LA 3.7 cm   2015 Had colonoscopy with lovenox bridge with Dr Karilyn Cota Erosive esophagitiis and pan colonic diverticulosis 05/31/19 had rectal bleed started on cipro/flagyl for ? Diverticulitis colonoscopy with no  Active bleeding or lesions    Enjoys cooking shows with wife.   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 December or January Told him to contract [employer] in [place] regarding lovenox bridge   ROS: Denies fever, malais, weight loss, blurry vision, decreased visual acuity, cough, sputum, SOB, hemoptysis, pleuritic pain, palpitaitons, heartburn, abdominal pain, melena, lower extremity edema, claudication, or rash.  All other systems reviewed and negative  General: BP 110/64   Pulse 69   Ht 6\' 1"  (1.854 m)   Wt 191 lb 12.8 oz (87 kg)   SpO2 98%   BMI 25.30 kg/m   Affect appropriate Healthy:  appears stated age HEENT: normal Neck supple with no  adenopathy JVP normal no bruits no thyromegaly Lungs clear with no wheezing and good diaphragmatic motion Heart:  S1 click /S2 midl MR  murmur, no rub, gallop or click PMI normal Abdomen: benighn, BS positve, no tenderness, no AAA no bruit.  No HSM or HJR Distal pulses intact with no bruits No edema Neuro non-focal Skin warm and dry No muscular weakness   Current Outpatient Medications  Medication Sig Dispense Refill   AFRIN NASAL SPRAY 0.05 % nasal spray Place 1 spray into both nostrils 2 (two) times daily as needed for congestion.     allopurinol (ZYLOPRIM) 300 MG tablet Take 300 mg by mouth daily.     colchicine 0.6 MG tablet Take 0.6 mg by mouth See admin instructions. Take 0.6 mg by mouth once a day as directed for gout flares     ezetimibe-simvastatin (VYTORIN) 10-20 MG tablet Take 1 tablet by mouth at bedtime.     FARXIGA 10 MG TABS tablet Take 10 mg by mouth daily.     guaiFENesin (MUCINEX) 600 MG 12 hr tablet Take 600 mg by mouth 2 (two) times daily as needed.     icosapent Ethyl (VASCEPA) 1 g capsule Take 2 g by mouth 2 (two) times daily.     lisinopril (PRINIVIL,ZESTRIL) 40 MG tablet Take 40 mg by mouth daily. Reported on 07/14/2015     metFORMIN (GLUCOPHAGE) 500 MG tablet Take 500 mg by mouth in the morning and at bedtime.  montelukast (SINGULAIR) 10 MG tablet Take 10 mg by mouth in the morning.     nebivolol (BYSTOLIC) 5 MG tablet Take 5 mg by mouth daily.     NEXLETOL 180 MG TABS Take 180 mg by mouth daily.     omeprazole (PRILOSEC) 40 MG capsule TAKE 1 CAPSULE(40 MG) BY MOUTH DAILY (Patient taking differently: Take 40 mg by mouth daily before breakfast.) 90 capsule 2   OVER THE COUNTER MEDICATION Take 1-2 tablets by mouth See admin instructions. TYLENOL Sinus + Headache Non-Drowsy Daytime Caplets for Nasal Congestion, Sinus Pressure & Pain Relief- Take 1-2 caplets by mouth every six hours as needed for migraines     Psyllium (METAMUCIL PO) Take 4 capsules by mouth  daily with breakfast.     Vitamin D, Ergocalciferol, (DRISDOL) 1.25 MG (50000 UNIT) CAPS capsule Take 50,000 Units by mouth once a week.     warfarin (COUMADIN) 5 MG tablet TAKE 1-2 TABLETS BY MOUTH EVERY DAY OR AS DIRECTED BY COUMADIN CLINIC 45 tablet 5   No current facility-administered medications for this visit.    Allergies  Erythromycin, Sulfamethoxazole, and Vytorin [ezetimibe-simvastatin]  Electrocardiogram: 02/28/2023 SR rate 85 LAD LVH poor R wave progression   Assessment and Plan  RBBB: improved 02/28/2023 now gone with LAD   GERD low carb diet protonix f/u GI previous EGD with Rehman  Anticoagulation Rx has some greens weekly but stable number  No bleeding issues INR 3.2 01/21/22   MVR Normal valve function by echo 08/15/20  reviewed   SBE Sees dentist q 6 months  HLD:  Continue vytorin labs with primary   Anticoagulation: chronic for MVR INR 3.0 02/15/23   Preoperative:  Hernia surgery Ok to proceed arrange Lovenox bridge prior to hernia surgery    F/u with me in 6 months   [name]

## 2023-02-23 NOTE — Telephone Encounter (Signed)
-----   Message from Olene Floss sent at 02/23/2023  7:38 AM EST ----- Mitral valve is higher clot risk and always needs a bridge. Its a mechanical aortic valve without other risk factors do not need a bridge. ----- Message ----- From: [name], MD Sent: 02/22/2023   2:35 PM EST To: Louanna Raw, RN; Jodelle Gross, NP; #  ? On bridging this patient. For uncomplicated MVR without prior history of stroke,TIA, cardioembolic event afib or mural thrombus I though no bridging was reasonable and that bridging only increases bleeding risk for surgery ??  Multiple notes indicate he needs bridging

## 2023-02-24 ENCOUNTER — Ambulatory Visit: Payer: Medicare PPO | Admitting: Gastroenterology

## 2023-02-24 ENCOUNTER — Encounter: Payer: Self-pay | Admitting: Gastroenterology

## 2023-02-24 VITALS — BP 134/80 | HR 77 | Temp 97.8°F | Ht 73.0 in | Wt 181.8 lb

## 2023-02-24 DIAGNOSIS — R1013 Epigastric pain: Secondary | ICD-10-CM | POA: Diagnosis not present

## 2023-02-24 DIAGNOSIS — R112 Nausea with vomiting, unspecified: Secondary | ICD-10-CM

## 2023-02-24 DIAGNOSIS — R109 Unspecified abdominal pain: Secondary | ICD-10-CM | POA: Diagnosis not present

## 2023-02-24 DIAGNOSIS — R197 Diarrhea, unspecified: Secondary | ICD-10-CM

## 2023-02-24 NOTE — Patient Instructions (Signed)
Please have blood work completed with [facility].  May increase omeprazole to 40 mg twice daily, 30 minutes before breakfast and dinner for now.  We will call you with lab results and further recommendations.  Please let me know if you have any worsening symptoms.  Ermalinda Memos, PA-C [facility] Gastroenterology

## 2023-02-24 NOTE — Progress Notes (Addendum)
Referring Provider: [name], MD Primary Care Physician:  [name], MD Primary GI Physician: Dr. Levon Hedger   Chief Complaint  Patient presents with   Abdominal Pain    Abdominal cramps and pains. Vomiting and nausea. Some bloating.     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 He had been on Rybelsus, but this was discontinued on 8/26 due to early satiety.  It was felt pancreatitis may have been secondary to Rybelsus though he was also found to have elevated triglycerides at 475.  IgG4 was within normal limits.  Had plan to repeat dedicated pancreatic CT in 6 weeks which is currently scheduled for 03/07/2023.   Last seen in the office 01/26/2023 and was doing very well at that time.  He had no significant GI symptoms.  He was taking omeprazole 40 mg daily which kept GERD symptoms well-controlled.   He is presenting today for acute visit.  Reports he has been doing well until yesterday.  He went to a reception.  At 10:30 AM, he had 3 cubes of cheese and water. About 30 minutes later, got sweaty and developed a lot of pain in the epigastric area, vomited a couple of times. Continues to have some epigastric discomfort this morning, but overall feeling better.  No further nausea or vomiting this morning. Took pepto bismol yesterday and this helped minimally. Had some loose stool yesterday. 2 large Bms yesterday. No black stool or blood in the stool. Hasn't had a BM yet today.   Marcelline Deist started 3 days ago.   No recent sick contacts.  He did recently travel to [place], but was doing well until yesterday.    Prior CT 12/24/22 with acute pancreatitis without  complication.  Mild diverticulosis without diverticulitis.  Also with 1.5 cm apparent nodular density in the second portion of the duodenum, most likely representing a fold.  No lesion was seen in this area on MRI in April 2024.  Attention on follow-up imaging recommended.  No prior EGD.    Past Medical History:  Diagnosis Date   ALLERGIC RHINITIS    Diabetes mellitus without complication (HCC)    H/O mitral valve replacement    10 yrs ago   HYPERLIPIDEMIA    Hypertension    Long term current use of anticoagulant    MITRAL VALVE PROLAPSE    PREMATURE VENTRICULAR CONTRACTIONS     Past Surgical History:  Procedure Laterality Date   APPENDECTOMY     CHOLECYSTECTOMY N/A 08/17/2022   Procedure: LAPAROSCOPIC CHOLECYSTECTOMY, NO CHOLANGIOGRAM;  Surgeon: [name], MD;  Location: [facility];  Service: General;  Laterality: N/A;   COLONOSCOPY N/A 03/29/2014   Procedure: COLONOSCOPY;  Surgeon: Malissa Hippo, MD;  Location: [facility];  Service: Endoscopy;  Laterality: N/A;  855   COLONOSCOPY N/A 06/01/2019   Rehman: ileum normal, diverticulosis in entire colon, external hemorhoids, no specimens   ERCP N/A 08/16/2022   Procedure: ENDOSCOPIC RETROGRADE CHOLANGIOPANCREATOGRAPHY (ERCP);  Surgeon: Kerin Salen, MD;  Location: [facility];  Service: Gastroenterology;  Laterality: N/A;   ESOPHAGOGASTRODUODENOSCOPY N/A 03/29/2014   Procedure: ESOPHAGOGASTRODUODENOSCOPY (EGD);  Surgeon: Malissa Hippo, MD;  Location: [facility];  Service: Endoscopy;  Laterality: N/A;   LITHOTRIPSY  08/16/2022   Procedure: LITHOTRIPSY;  Surgeon: Kerin Salen, MD;  Location: [facility];  Service: Gastroenterology;;   MITRAL VALVE REPLACEMENT     REMOVAL OF STONES  08/16/2022   Procedure: REMOVAL OF STONES;  Surgeon: Kerin Salen, MD;  Location: [facility];  Service: Gastroenterology;;   SHOULDER SURGERY     SPHINCTEROTOMY  08/16/2022   Procedure: Dennison Mascot;  Surgeon: Kerin Salen, MD;  Location: [facility];  Service: Gastroenterology;;    Current Outpatient Medications  Medication Sig Dispense Refill   AFRIN NASAL SPRAY 0.05 % nasal spray Place 1 spray into both nostrils 2 (two) times daily as needed for congestion.     allopurinol (ZYLOPRIM) 300 MG tablet Take 300 mg by mouth daily.     colchicine 0.6 MG tablet Take 0.6 mg by mouth See admin instructions. Take 0.6 mg by mouth once a day as directed for gout flares     ezetimibe-simvastatin (VYTORIN) 10-20 MG tablet Take 1 tablet by mouth at bedtime.     FARXIGA 10 MG TABS tablet Take 10 mg by mouth daily.     guaiFENesin (MUCINEX) 600 MG 12 hr tablet Take 600 mg by mouth 2 (two) times daily as needed.     icosapent Ethyl (VASCEPA) 1 g capsule Take 2 g by mouth 2 (two) times daily.     lisinopril (PRINIVIL,ZESTRIL) 40 MG tablet Take 40 mg by mouth daily. Reported on 07/14/2015     metFORMIN (GLUCOPHAGE) 500 MG tablet Take 500 mg by mouth in the morning and at bedtime.     montelukast (SINGULAIR) 10 MG tablet Take 10 mg by mouth in the morning.     nebivolol (BYSTOLIC) 5 MG tablet Take 5 mg by mouth daily.     NEXLETOL 180 MG TABS Take 180 mg by mouth daily.     omeprazole (PRILOSEC) 40 MG capsule TAKE 1 CAPSULE(40 MG) BY MOUTH DAILY (Patient taking differently: Take 40 mg by mouth daily before breakfast.) 90 capsule 2   OVER THE COUNTER MEDICATION Take 1-2 tablets by mouth See admin instructions. TYLENOL Sinus + Headache Non-Drowsy Daytime Caplets for Nasal Congestion, Sinus Pressure & Pain Relief- Take 1-2 caplets by mouth every six hours as needed for migraines     Psyllium (METAMUCIL PO) Take 4 capsules by mouth daily with breakfast.     Vitamin D, Ergocalciferol, (DRISDOL) 1.25 MG (50000 UNIT) CAPS capsule Take 50,000 Units by mouth once a week.     warfarin (COUMADIN) 5 MG tablet TAKE 1-2 TABLETS BY MOUTH EVERY DAY OR AS DIRECTED BY COUMADIN CLINIC 45 tablet 5   No current facility-administered medications for this visit.     Allergies as of 02/24/2023 - Review Complete 02/24/2023  Allergen Reaction Noted   Erythromycin Other (See Comments) 02/18/2012   Sulfamethoxazole Other (See Comments) 12/17/2013   Vytorin [ezetimibe-simvastatin] Other (See Comments) 08/12/2022    History reviewed. No pertinent family history.  Social History   Socioeconomic History   Marital status: Married    Spouse name: Not on file   Number of children: Not on file   Years of education: Not on file   Highest education level: Not on file  Occupational History   Not on file  Tobacco Use   Smoking status: Never   Smokeless tobacco: Never  Vaping Use   Vaping status: Never Used  Substance and Sexual Activity   Alcohol use: Yes    Comment: occ   Drug use: No  Sexual activity: Not on file  Other Topics Concern   Not on file  Social History Narrative   Not on file   Social Determinants of Health   Financial Resource Strain: Not on file  Food Insecurity: No Food Insecurity (08/12/2022)   Hunger Vital Sign    Worried About Running Out of Food in the Last Year: Never true    Ran Out of Food in the Last Year: Never true  Transportation Needs: No Transportation Needs (08/12/2022)   PRAPARE - Administrator, Civil Service (Medical): No    Lack of Transportation (Non-Medical): No  Physical Activity: Not on file  Stress: Not on file  Social Connections: Not on file    Review of Systems: Gen: Denies fever, chills, cold or flulike symptoms, presyncope, syncope. GI: See HPI Heme: See HPI  Physical Exam: BP 134/80 (BP Location: Right Arm, Patient Position: Sitting, Cuff Size: Normal)   Pulse 77   Temp 97.8 F (36.6 C) (Temporal)   Ht 6\' 1"  (1.854 m)   Wt 181 lb 12.8 oz (82.5 kg)   BMI 23.99 kg/m  General:   Alert and oriented. No distress noted. Pleasant and cooperative.  Head:  Normocephalic and atraumatic. Eyes:  Conjuctiva clear without scleral icterus. Heart:  S1, S2 present without murmurs  appreciated. Lungs:  Clear to auscultation bilaterally. No wheezes, rales, or rhonchi. No distress.  Abdomen:  +BS, soft, and non-distended. Mild epigastric TTP and minimal RUQ and LUQ TTP. No rebound or guarding.  Msk:  Symmetrical without gross deformities. Normal posture. Extremities:  Without edema. Neurologic:  Alert and  oriented x4 Psych:  Normal mood and affect.    Assessment:  72 year old male with history of GERD, choledocholithiasis in April 2024 s/p ERCP and laparoscopic cholecystectomy, recurrent bouts of upper abdominal pain with nausea/vomiting starting in July of this year, ultimately diagnosed with uncomplicated acute pancreatitis in September possibly secondary to Rybelsus though also with elevated triglycerides. He was treated supportively in the outpatient setting as symptoms had essentially resolved by the time of diagnosis. Rybelsus has also been discontinued. He is presenting today for acute visit with chief complaint of acute onset epigastric abdominal pain with nausea, vomiting, loose stool starting yesterday after eating a few cheese cubes and drinking water.  Symptoms overall much improved today, but continues with some mild epigastric pain.    Etiology unclear. It is possible that he has acute self-limiting gastroenteritis, but I do note that this is very similar to his prior episodes of abdominal pain. Unable to rule out recurrent pancreatitis. He was started on Farxiga 3 days ago.   Aside from labs and CT previously noted pancreatitis, no additional workup has been pursued.  May need to consider EGD in the near future. For now, we will start with updating labs and seeing how he does over he next few days. He is currently scheduled for pancreatic protocol CT on 11/18, which we will leave in place unless labs suggestive of pancreatitis. I have advised that he can increase omeprazole to twice daily to help with residual epigastric discomfort for now.    Plan:  CBC, Cmp,  Lipase.  Keep Pancreatic Protocol CT as scheduled on 11/18 for now.  Increase omeprazole to 40 mg twice daily for now.  May need to consider EGD for further evaluation of recurrent epigastric pain, nausea, vomiting.    Ermalinda Memos, PA-C [facility] 02/24/2023   I have reviewed the note and agree with the APP's assessment  as described in this progress note  Patient with recurrent pancreatitis, unclear etiology. Notably, he was started on Farxiga a day before his symptoms started. There are a few reports of Farxiga related acute pancreatitis. Will discuss with the patient avoiding this medication or GLP-1 inhibitors, as advised in the past. Will postpone imaging for 6 more weeks. If presenting recurrent pancreatitis despite these measures and with negative imaging, will need to perform EUS.  Katrinka Blazing, MD Gastroenterology and Hepatology [facility]

## 2023-02-25 ENCOUNTER — Encounter: Payer: Self-pay | Admitting: *Deleted

## 2023-02-25 ENCOUNTER — Telehealth: Payer: Self-pay | Admitting: Gastroenterology

## 2023-02-25 NOTE — Telephone Encounter (Signed)
Sent pt a Wellsite geologist. He responded

## 2023-02-25 NOTE — Telephone Encounter (Signed)
Please call patient to let him know his blood work suggest that he does have recurrent pancreatitis. Lipase is higher than it was previously at 760, white blood cell count is elevated at 19.5.  Additionally, BUN, calcium, hematocrit are all elevated, likely secondary to pancreatitis/hemoconcentration.  I would recommend that he proceed to the emergency room today for for IV fluids/likely admission due to recurrent acute pancreatitis.

## 2023-02-27 ENCOUNTER — Encounter (INDEPENDENT_AMBULATORY_CARE_PROVIDER_SITE_OTHER): Payer: Self-pay | Admitting: Gastroenterology

## 2023-02-27 DIAGNOSIS — E1165 Type 2 diabetes mellitus with hyperglycemia: Secondary | ICD-10-CM | POA: Diagnosis not present

## 2023-02-28 ENCOUNTER — Ambulatory Visit: Payer: Medicare PPO | Attending: Cardiovascular Disease | Admitting: Cardiovascular Disease

## 2023-02-28 ENCOUNTER — Encounter: Payer: Self-pay | Admitting: Cardiovascular Disease

## 2023-02-28 VITALS — BP 110/64 | HR 69 | Ht 73.0 in | Wt 191.8 lb

## 2023-02-28 DIAGNOSIS — R109 Unspecified abdominal pain: Secondary | ICD-10-CM | POA: Diagnosis not present

## 2023-02-28 DIAGNOSIS — Z01810 Encounter for preprocedural cardiovascular examination: Secondary | ICD-10-CM | POA: Diagnosis not present

## 2023-02-28 DIAGNOSIS — Z5181 Encounter for therapeutic drug level monitoring: Secondary | ICD-10-CM | POA: Diagnosis not present

## 2023-02-28 DIAGNOSIS — Z7901 Long term (current) use of anticoagulants: Secondary | ICD-10-CM | POA: Diagnosis not present

## 2023-02-28 DIAGNOSIS — Z952 Presence of prosthetic heart valve: Secondary | ICD-10-CM | POA: Diagnosis not present

## 2023-02-28 NOTE — Patient Instructions (Signed)
Medication Instructions:  Your physician recommends that you continue on your current medications as directed. Please refer to the Current Medication list given to you today.  *If you need a refill on your cardiac medications before your next appointment, please call your pharmacy*   Lab Work: NONE   If you have labs (blood work) drawn today and your tests are completely normal, you will receive your results only by: MyChart Message (if you have MyChart) OR A paper copy in the mail If you have any lab test that is abnormal or we need to change your treatment, we will call you to review the results.   Testing/Procedures: NONE    Follow-Up: At [facility], you and your health needs are our priority.  As part of our continuing mission to provide you with exceptional heart care, we have created designated Provider Care Teams.  These Care Teams include your primary Cardiologist (physician) and Advanced Practice Providers (APPs -  Physician Assistants and Nurse Practitioners) who all work together to provide you with the care you need, when you need it.  We recommend signing up for the patient portal called "MyChart".  Sign up information is provided on this After Visit Summary.  MyChart is used to connect with patients for Virtual Visits (Telemedicine).  Patients are able to view lab/test results, encounter notes, upcoming appointments, etc.  Non-urgent messages can be sent to your provider as well.   To learn more about what you can do with MyChart, go to [email or web address].    Your next appointment:   6 month(s)  Provider:   You may see Peter Nishan, MD or one of the following Advanced Practice Providers on your designated Care Team:   Brittany Strader, PA-C  Michele Lenze, PA-C     Other Instructions Thank you for choosing [facility]!    

## 2023-03-01 NOTE — Telephone Encounter (Signed)
Mindy/Tammy:  We need to reschedule patient's CT with pancreatic protocol for 6 weeks from now due to recurrent acute pancreatitis.

## 2023-03-02 ENCOUNTER — Other Ambulatory Visit: Payer: Self-pay | Admitting: *Deleted

## 2023-03-02 NOTE — Addendum Note (Signed)
Addended by: Elinor Dodge on: 03/02/2023 08:54 AM   Modules accepted: Orders

## 2023-03-02 NOTE — Telephone Encounter (Signed)
[employer] PA: Approved Authorization #[id number]  Tracking #[id number] Dates of service 03/02/2023 - 06/17/2023

## 2023-03-07 ENCOUNTER — Ambulatory Visit (HOSPITAL_COMMUNITY): Payer: Medicare PPO

## 2023-03-15 ENCOUNTER — Ambulatory Visit: Payer: Medicare PPO | Attending: Cardiovascular Disease | Admitting: *Deleted

## 2023-03-15 DIAGNOSIS — Z5181 Encounter for therapeutic drug level monitoring: Secondary | ICD-10-CM | POA: Diagnosis not present

## 2023-03-15 DIAGNOSIS — Z952 Presence of prosthetic heart valve: Secondary | ICD-10-CM | POA: Diagnosis not present

## 2023-03-15 LAB — POCT INR: INR: 2 (ref 2.0–3.0)

## 2023-03-15 NOTE — Patient Instructions (Signed)
Take warfarin 2 tablets tonight, take 1 1/2 tablets tomorrow night then increase dose to 1 1/2 tablets daily except 1 tablet on Mondays, Wednesdays and Fridays. Recheck in 2 wks Pending hernia repair probably in Dec/Jan.  Will hold warfarin 5 days before procedure and bridge with Lovenox.  Pt will let me know when procedure is scheduled.

## 2023-03-28 ENCOUNTER — Ambulatory Visit: Payer: Medicare PPO | Attending: Cardiovascular Disease | Admitting: *Deleted

## 2023-03-28 DIAGNOSIS — Z952 Presence of prosthetic heart valve: Secondary | ICD-10-CM

## 2023-03-28 DIAGNOSIS — Z5181 Encounter for therapeutic drug level monitoring: Secondary | ICD-10-CM | POA: Diagnosis not present

## 2023-03-28 LAB — POCT INR: INR: 2.8 (ref 2.0–3.0)

## 2023-03-28 NOTE — Patient Instructions (Signed)
Continue warfarin 1 1/2 tablets daily except 1 tablet on Mondays, Wednesdays and Fridays. Recheck in 4 wks Pending hernia repair probably in Dec/Jan.  Will hold warfarin 5 days before procedure and bridge with Lovenox.  Pt will let me know when procedure is scheduled.

## 2023-03-29 DIAGNOSIS — E1165 Type 2 diabetes mellitus with hyperglycemia: Secondary | ICD-10-CM | POA: Diagnosis not present

## 2023-04-19 ENCOUNTER — Ambulatory Visit (HOSPITAL_COMMUNITY)
Admission: RE | Admit: 2023-04-19 | Discharge: 2023-04-19 | Disposition: A | Discharge status: Home / Self Care | Payer: Medicare PPO | Source: Ambulatory Visit | Attending: Gastroenterology | Admitting: Gastroenterology

## 2023-04-19 DIAGNOSIS — Z8719 Personal history of other diseases of the digestive system: Secondary | ICD-10-CM | POA: Diagnosis not present

## 2023-04-19 DIAGNOSIS — N289 Disorder of kidney and ureter, unspecified: Secondary | ICD-10-CM | POA: Diagnosis not present

## 2023-04-19 DIAGNOSIS — K859 Acute pancreatitis without necrosis or infection, unspecified: Secondary | ICD-10-CM | POA: Diagnosis not present

## 2023-04-19 DIAGNOSIS — R933 Abnormal findings on diagnostic imaging of other parts of digestive tract: Secondary | ICD-10-CM | POA: Diagnosis not present

## 2023-04-19 DIAGNOSIS — K5710 Diverticulosis of small intestine without perforation or abscess without bleeding: Secondary | ICD-10-CM | POA: Diagnosis not present

## 2023-04-19 DIAGNOSIS — K449 Diaphragmatic hernia without obstruction or gangrene: Secondary | ICD-10-CM | POA: Diagnosis not present

## 2023-04-19 LAB — POCT I-STAT CREATININE: Creatinine, Ser: 1.3 mg/dL — ABNORMAL HIGH (ref 0.61–1.24)

## 2023-04-19 MED ORDER — IOHEXOL 300 MG/ML  SOLN
100.0000 mL | Freq: Once | INTRAMUSCULAR | Status: AC | PRN
  Administered 2023-04-19: 100 mL via INTRAVENOUS

## 2023-04-22 ENCOUNTER — Ambulatory Visit: Payer: Self-pay | Admitting: General Surgery

## 2023-04-26 ENCOUNTER — Ambulatory Visit: Payer: Medicare PPO | Attending: Cardiovascular Disease | Admitting: *Deleted

## 2023-04-26 DIAGNOSIS — Z952 Presence of prosthetic heart valve: Secondary | ICD-10-CM

## 2023-04-26 DIAGNOSIS — Z5181 Encounter for therapeutic drug level monitoring: Secondary | ICD-10-CM

## 2023-04-26 LAB — POCT INR: INR: 2.7 (ref 2.0–3.0)

## 2023-04-26 NOTE — Patient Instructions (Signed)
 Continue warfarin 1 1/2 tablets daily except 1 tablet on Mondays, Wednesdays and Fridays. Recheck in 4 wks Pending hernia repair 05/19/23.  Will hold warfarin 5 days before procedure and bridge with Lovenox.

## 2023-04-29 DIAGNOSIS — E1165 Type 2 diabetes mellitus with hyperglycemia: Secondary | ICD-10-CM | POA: Diagnosis not present

## 2023-05-01 ENCOUNTER — Encounter: Payer: Self-pay | Admitting: Cardiovascular Disease

## 2023-05-02 ENCOUNTER — Other Ambulatory Visit: Payer: Self-pay

## 2023-05-02 ENCOUNTER — Telehealth: Payer: Self-pay | Admitting: Gastroenterology

## 2023-05-02 DIAGNOSIS — Z136 Encounter for screening for cardiovascular disorders: Secondary | ICD-10-CM

## 2023-05-02 NOTE — Telephone Encounter (Signed)
  Request for patient to stop medication prior to procedure or is needing cleareance  05/02/23  Luis Messing, PhD Aug 18, 1950  What type of surgery is being performed? egd  When is surgery scheduled? tbd  What type of clearance is required (medical or pharmacy to hold medication or both? PHARMACY  Are there any medications that need to be held prior to surgery and how long? WARFARIN X 5 DAYS AND IF NEEDS LOVENOX  BRIDGE  Name of physician performing surgery?  Dr. Eartha Rouse Gastroenterology at [facility] Phone: [phone] Fax: [phone]  Anethesia type (none, local, MAC, general)? MAC

## 2023-05-02 NOTE — Telephone Encounter (Signed)
 Mindy/Tammy:  Please arrange EGD with Dr. Eartha in the later part of February.  Dx: Abnormal CT small bowel ASA 3 Hold Farxiga  x 3 days prior We will need to reach cardiology to get ok to hold Warfarin and instruction on Lovenox  bridge. Lovenox  bridge will coordinated with Olam Bathe, RN

## 2023-05-02 NOTE — Telephone Encounter (Signed)
 Pharmacy please advise on holding warfarin prior to EGD scheduled for TBD. Thank you.

## 2023-05-03 NOTE — Telephone Encounter (Signed)
 Patient with diagnosis of mechanical mitral valve on warfarin for anticoagulation.    Procedure: EGD Date of procedure: TBD  CrCl 62 ml/min Platelet count 496  Per office protocol, patient can hold warfarin for 5 days prior to procedure.    Patient WILL need bridging with Lovenox  (enoxaparin ) around procedure.  Coumadin  clinic to coordinate  **This guidance is not considered finalized until pre-operative APP has relayed final recommendations.**

## 2023-05-03 NOTE — Telephone Encounter (Signed)
   Patient Name: Luis Betten, PhD  DOB: 13-Feb-1951 MRN: [id number]  Primary Cardiologist: Maude Emmer, MD  Clinical pharmacists have reviewed the patient's past medical history, labs, and current medications as part of preoperative protocol coverage. The following recommendations have been made:  Patient with diagnosis of mechanical mitral valve on warfarin for anticoagulation.     Procedure: EGD Date of procedure: TBD   CrCl 62 ml/min Platelet count 496   Per office protocol, patient can hold warfarin for 5 days prior to procedure.     Patient WILL need bridging with Lovenox  (enoxaparin ) around procedure.   Coumadin  clinic to coordinate       I will route this recommendation to the requesting party via Epic fax function and remove from pre-op pool.  Please call with questions.  Lamarr Satterfield, NP 05/03/2023, 5:00 PM

## 2023-05-04 NOTE — Telephone Encounter (Signed)
 Left message to return call

## 2023-05-09 NOTE — Telephone Encounter (Signed)
Pt contacted and scheduled. Instructions placed on my chart. Will send my chart message with pre op appt. PA approved via [employer].

## 2023-05-10 ENCOUNTER — Encounter (INDEPENDENT_AMBULATORY_CARE_PROVIDER_SITE_OTHER): Payer: Self-pay

## 2023-05-11 ENCOUNTER — Ambulatory Visit: Payer: Medicare PPO | Attending: Cardiovascular Disease | Admitting: *Deleted

## 2023-05-11 ENCOUNTER — Ambulatory Visit: Payer: Self-pay | Admitting: General Surgery

## 2023-05-11 DIAGNOSIS — Z5181 Encounter for therapeutic drug level monitoring: Secondary | ICD-10-CM

## 2023-05-11 DIAGNOSIS — Z952 Presence of prosthetic heart valve: Secondary | ICD-10-CM | POA: Diagnosis not present

## 2023-05-11 LAB — POCT INR: INR: 2.5 (ref 2.0–3.0)

## 2023-05-11 MED ORDER — ENOXAPARIN SODIUM 80 MG/0.8ML IJ SOSY: 80.0000 mg | PREFILLED_SYRINGE | Freq: Two times a day (BID) | INTRAMUSCULAR | 0 refills | Status: DC

## 2023-05-11 NOTE — Patient Instructions (Signed)
05/19/23  Hernia Repair  Labs:  01/26/23  Hgb 13.4  Hct 43.4  Plts 283  Scr 0.94   04/19/23  Scr 1.3   (repeat labs on 1.24.25 at pre-op appt)   Wt 84kg  Lovenox 80mg  twice daily at 7am and 7pm .  Rx sent to [facility]  #20 syringes  1/24  Last dose of warfarin 1/25  No lovenox or warfarin 1/26 - 1/28  Lovenox 80mg  twice daily SQ at 7am & 7pm 1/29  Lovenox 80mg  @7am  ----- NO Lovenox pm 1/30  NO lovenox in AM  ---- Surgery ----Warfarin 10mg  pm 1/31   Lovenox 80mg  twice daily SQ at 7am & 7pm and warfarin 7.5mg  pm 2/1 - 2/2 Lovenox 80mg  twice daily SQ at 7am & 7pm and warfarin 7.5mg  pm 2/3 Lovenox 80mg  twice daily SQ at 7am & 7pm and warfarin 5mg  pm 2/4  Lovenox 80mg  twice daily SQ at 7am & 7pm and warfarin 7.5mg  pm 2/5 Lovenox 80mg  am  --------- INR appt

## 2023-05-12 NOTE — Progress Notes (Addendum)
COVID Vaccine received:  []  No [x]  Yes Date of any COVID positive Test in last 90 days: None  PCP -  Fidel Levy, MD  Cardiologist - [name], MD  cardiac clearance in Epic note 02-28-2023  Chest x-ray - 05-23-2018  2v  Epic EKG - 02-28-2023  Epic  Stress Test -  ECHO - 08-15-2020   Epic Cardiac Cath -   PCR screen: []  Ordered & Completed []   No Order but Needs PROFEND     [x]   N/A for this surgery  Surgery Plan:  [x]  Ambulatory   []  Outpatient in bed  []  Admit Anesthesia:    [x]  General  []  Spinal  []   Choice []   MAC  Bowel Prep - [x]  No  []   Yes ______  Pacemaker / ICD device [x]  No []  Yes   Spinal Cord Stimulator:[x]  No []  Yes       History of Sleep Apnea? [x]  No []  Yes   CPAP used?- [x]  No []  Yes    Does the patient monitor blood sugar?   []  N/A   [x]  No []  Yes  Patient has: []  NO Hx DM   [x]  Pre-DM   []  DM1  []   DM2 Last A1c was:  5.7  on 08-13-2022      SGLT-2 inhibitors / usual dose - Farxiga  SGLT-2 instructions: hold x 72 hours METFORMIN    Hold DOS  Blood Thinner / Instructions: Coumadin, hold x 5 days will do Lovenox bridge. Working with Vashti Hey. Aspirin Instructions:   none  ERAS Protocol Ordered: []  No  [x]  Yes PRE-SURGERY []  ENSURE  []  G2   [x]  No Drink Ordered Patient is to be NPO after: 0615  Dental hx: []  Dentures:  [x]  N/A      []  Bridge or Partial:  upper permanent bridge                  []  Loose or Damaged teeth:   Activity level: Patient is able / unable to climb a flight of stairs without difficulty; [x]  No CP  [x]  No SOB,   Patient can perform ADLs without assistance.   Anesthesia review: s/p MVR (04-07-1999 in [place]), HTN, GERD, Pre-DM  Patient denies shortness of breath, fever, cough and chest pain at PAT appointment.  Patient verbalized understanding and agreement to the Pre-Surgical Instructions that were given to them at this PAT appointment. Patient was also educated of the need to review these PAT instructions again prior to  his surgery.I reviewed the appropriate phone numbers to call if they have any and questions or concerns.

## 2023-05-12 NOTE — Patient Instructions (Addendum)
SURGICAL WAITING ROOM VISITATION Patients having surgery or a procedure may have no more than 2 support people in the waiting area - these visitors may rotate in the visitor waiting room.   Due to an increase in RSV and influenza rates and associated hospitalizations, children ages 30 and under may not visit patients in [facility] hospitals. If the patient needs to stay at the hospital during part of their recovery, the visitor guidelines for inpatient rooms apply.  PRE-OP VISITATION  Pre-op nurse will coordinate an appropriate time for 1 support person to accompany the patient in pre-op.  This support person may not rotate.  This visitor will be contacted when the time is appropriate for the visitor to come back in the pre-op area.  Please refer to the [facility] website for the visitor guidelines for Inpatients (after your surgery is over and you are in a regular room).  You are not required to quarantine at this time prior to your surgery. However, you must do this: Hand Hygiene often Do NOT share personal items Notify your provider if you are in close contact with someone who has COVID or you develop fever 100.4 or greater, new onset of sneezing, cough, sore throat, shortness of breath or body aches.  If you test positive for Covid or have been in contact with anyone that has tested positive in the last 10 days please notify you surgeon.    Your procedure is scheduled on:  Thursday  January 30,2025  Report to [facility] Main Entrance: [place] entrance where the [employer] is available.   Report to admitting at:  07:00   AM  Call this number if you have any questions or problems the morning of surgery [phone]  Do not eat food after Midnight the night prior to your surgery/procedure.  After Midnight you may have the following liquids until   06:15  AM DAY OF SURGERY  Clear Liquid Diet Water Black Coffee (sugar ok, NO MILK/CREAM OR CREAMERS)  Tea (sugar ok, NO  MILK/CREAM OR CREAMERS) regular and decaf                             Plain Jell-O  with no fruit (NO RED)                                           Fruit ices (not with fruit pulp, NO RED)                                     Popsicles (NO RED)                                                                  Juice: NO CITRUS JUICES: only apple, WHITE grape, WHITE cranberry Sports drinks like Gatorade or Powerade (NO RED)                FOLLOW ANY ADDITIONAL PRE OP INSTRUCTIONS YOU RECEIVED FROM YOUR SURGEON'S OFFICE!!!   Oral Hygiene is also important to reduce your  risk of infection.        Remember - BRUSH YOUR TEETH THE MORNING OF SURGERY WITH YOUR REGULAR TOOTHPASTE  Do NOT smoke after Midnight the night before surgery.  FARXIGA-  Stop taking this medication 72 hours prior to surgery.  Your last dose will be taken on : Sunday 05-15-2023 METFORMIN-  Take as usual the Day BEFORE surgery.  DO NOT TAKE THE DAY OF SURGERY.   COUMADIN-  Stop taking this medication 5 days before your surgery. Vashti Hey will give your information on the Lovenox bridge injections.   STOP TAKING all Vitamins, Herbs and supplements 1 week before your surgery.   Take ONLY these medicines the morning of surgery with A SIP OF WATER: Omeprazole, nebivolol, allopurinol. You may use your Nasal spray if needed.                    You may not have any metal on your body including  jewelry, and body piercing  Do not wear  lotions, powders,  cologne, or deodorant  Men may shave face and neck.  Contacts, Hearing Aids, dentures or bridgework may not be worn into surgery. DENTURES WILL BE REMOVED PRIOR TO SURGERY PLEASE DO NOT APPLY "Poly grip" OR ADHESIVES!!!  Patients discharged on the day of surgery will not be allowed to drive home.  Someone NEEDS to stay with you for the first 24 hours after anesthesia.  Do not bring your home medications to the hospital. The Pharmacy will dispense medications listed on your  medication list to you during your admission in the Hospital.  Special Instructions: Bring a copy of your healthcare power of attorney and living will documents the day of surgery, if you wish to have them scanned into your [facility] Records- EPIC  Please read over the following fact sheets you were given: IF YOU HAVE QUESTIONS ABOUT YOUR PRE-OP INSTRUCTIONS, PLEASE CALL [phone]   [facility] - Preparing for Surgery Before surgery, you can play an important role.  Because skin is not sterile, your skin needs to be as free of germs as possible.  You can reduce the number of germs on your skin by washing with CHG (chlorahexidine gluconate) soap before surgery.  CHG is an antiseptic cleaner which kills germs and bonds with the skin to continue killing germs even after washing. Please DO NOT use if you have an allergy to CHG or antibacterial soaps.  If your skin becomes reddened/irritated stop using the CHG and inform your nurse when you arrive at Short Stay. Do not shave (including legs and underarms) for at least 48 hours prior to the first CHG shower.  You may shave your face/neck.  Please follow these instructions carefully:  1.  Shower with CHG Soap the night before surgery and the  morning of surgery.  2.  If you choose to wash your hair, wash your hair first as usual with your normal  shampoo.  3.  After you shampoo, rinse your hair and body thoroughly to remove the shampoo.                             4.  Use CHG as you would any other liquid soap.  You can apply chg directly to the skin and wash.  Gently with a scrungie or clean washcloth.  5.  Apply the CHG Soap to your body ONLY FROM THE NECK DOWN.   Do not use  on face/ open                           Wound or open sores. Avoid contact with eyes, ears mouth and genitals (private parts).                       Wash face,  Genitals (private parts) with your normal soap.             6.  Wash thoroughly, paying special attention to  the area where your  surgery  will be performed.  7.  Thoroughly rinse your body with warm water from the neck down.  8.  DO NOT shower/wash with your normal soap after using and rinsing off the CHG Soap.            9.  Pat yourself dry with a clean towel.            10.  Wear clean pajamas.            11.  Place clean sheets on your bed the night of your first shower and do not  sleep with pets.  ON THE DAY OF SURGERY : Do not apply any lotions/deodorants the morning of surgery.  Please wear clean clothes to the hospital/surgery center.     FAILURE TO FOLLOW THESE INSTRUCTIONS MAY RESULT IN THE CANCELLATION OF YOUR SURGERY  PATIENT SIGNATURE_________________________________  NURSE SIGNATURE__________________________________  ________________________________________________________________________

## 2023-05-13 ENCOUNTER — Other Ambulatory Visit: Payer: Self-pay

## 2023-05-13 ENCOUNTER — Encounter (HOSPITAL_COMMUNITY)
Admission: RE | Admit: 2023-05-13 | Discharge: 2023-05-13 | Disposition: A | Discharge status: Home / Self Care | Payer: Medicare PPO | Source: Ambulatory Visit | Attending: General Surgery | Admitting: General Surgery

## 2023-05-13 ENCOUNTER — Encounter (HOSPITAL_COMMUNITY): Payer: Self-pay

## 2023-05-13 VITALS — BP 138/86 | HR 72 | Temp 98.6°F | Resp 14 | Ht 73.0 in | Wt 182.0 lb

## 2023-05-13 DIAGNOSIS — Z79899 Other long term (current) drug therapy: Secondary | ICD-10-CM | POA: Insufficient documentation

## 2023-05-13 DIAGNOSIS — Z01818 Encounter for other preprocedural examination: Secondary | ICD-10-CM | POA: Diagnosis present

## 2023-05-13 DIAGNOSIS — Z01812 Encounter for preprocedural laboratory examination: Secondary | ICD-10-CM | POA: Diagnosis not present

## 2023-05-13 DIAGNOSIS — I10 Essential (primary) hypertension: Secondary | ICD-10-CM | POA: Insufficient documentation

## 2023-05-13 DIAGNOSIS — M1990 Unspecified osteoarthritis, unspecified site: Secondary | ICD-10-CM | POA: Insufficient documentation

## 2023-05-13 DIAGNOSIS — K219 Gastro-esophageal reflux disease without esophagitis: Secondary | ICD-10-CM | POA: Diagnosis not present

## 2023-05-13 DIAGNOSIS — Z7901 Long term (current) use of anticoagulants: Secondary | ICD-10-CM | POA: Diagnosis not present

## 2023-05-13 DIAGNOSIS — R7303 Prediabetes: Secondary | ICD-10-CM | POA: Insufficient documentation

## 2023-05-13 DIAGNOSIS — K4090 Unilateral inguinal hernia, without obstruction or gangrene, not specified as recurrent: Secondary | ICD-10-CM | POA: Insufficient documentation

## 2023-05-13 DIAGNOSIS — Z952 Presence of prosthetic heart valve: Secondary | ICD-10-CM | POA: Insufficient documentation

## 2023-05-13 DIAGNOSIS — I493 Ventricular premature depolarization: Secondary | ICD-10-CM | POA: Insufficient documentation

## 2023-05-13 HISTORY — DX: Gastro-esophageal reflux disease without esophagitis: K21.9

## 2023-05-13 HISTORY — DX: Unspecified osteoarthritis, unspecified site: M19.90

## 2023-05-13 HISTORY — DX: Prediabetes: R73.03

## 2023-05-13 HISTORY — DX: Malignant (primary) neoplasm, unspecified: C80.1

## 2023-05-13 LAB — CBC
HCT: 47.6 % (ref 39.0–52.0)
Hemoglobin: 14.3 g/dL (ref 13.0–17.0)
MCH: 26.9 pg (ref 26.0–34.0)
MCHC: 30 g/dL (ref 30.0–36.0)
MCV: 89.5 fL (ref 80.0–100.0)
Platelets: 283 10*3/uL (ref 150–400)
RBC: 5.32 MIL/uL (ref 4.22–5.81)
RDW: 15.2 % (ref 11.5–15.5)
WBC: 7.2 10*3/uL (ref 4.0–10.5)
nRBC: 0 % (ref 0.0–0.2)

## 2023-05-13 LAB — BASIC METABOLIC PANEL
Anion gap: 11 (ref 5–15)
BUN: 25 mg/dL — ABNORMAL HIGH (ref 8–23)
CO2: 24 mmol/L (ref 22–32)
Calcium: 9.8 mg/dL (ref 8.9–10.3)
Chloride: 106 mmol/L (ref 98–111)
Creatinine, Ser: 1.03 mg/dL (ref 0.61–1.24)
GFR, Estimated: >60 mL/min (ref >60)
Glucose, Bld: 130 mg/dL — ABNORMAL HIGH (ref 70–99)
Potassium: 5.7 mmol/L — ABNORMAL HIGH (ref 3.5–5.1)
Sodium: 141 mmol/L (ref 135–145)

## 2023-05-16 ENCOUNTER — Encounter (HOSPITAL_COMMUNITY): Payer: Self-pay

## 2023-05-16 NOTE — Progress Notes (Signed)
Case: [id number] Date/Time: 05/19/23 0905   Procedure: LEFT OPEN INGUINAL HERNIA REPAIR WITH MESH (Left)   Anesthesia type: General   Pre-op diagnosis: LEFT INGUINAL HERNIA   Location[facility] ROOM 01 / [facility]   Surgeons: Kinsinger, De Blanch, MD       DISCUSSION: Luis Haynes is a 73 year old male who presents to PAT prior to surgery above.  Past medical history significant for hypertension, MVP status post MV replacement (2000), PVCs, pancreatitis of unclear etiology, prediabetes, GERD, arthritis  Patient follows with cardiology for history of mitral valve replacement.  Last seen by cardiology on 02/28/2023.  Noted to be doing well at that time. Last echo in 2022 showed normal EF, severe LVH, grossly normal function of prosthetic mitral valve. He takes warfarin for his valve and will be doing a Lovenox bridge.  Lovenox bridge instructions in 05/11/23 anti coag note. He was cleared for surgery:  "Preoperative:  Hernia surgery Ok to proceed arrange Lovenox bridge prior to hernia surgery "  Patient is currently being followed by GI for recurrent pancreatitis of unclear etiology. He had  choledocholithiasis in April 2024 s/p ERCP and laparoscopic cholecystectomy but continued to have recurrent bouts of upper abdominal pain with nausea/vomiting with imaging and labs consistent with acute pancreatitis. He currently has EGD scheduled for 2/25.   VS: BP 138/86 Comment: right arm sitting  Pulse 72   Temp 37 C (Oral)   Resp 14   Ht 6\' 1"  (1.854 m)   Wt 82.6 kg   SpO2 98%   BMI 24.01 kg/m   PROVIDERS: [name], MD Cardiology: [name], MD GI: Katrinka Blazing, MD  LABS: Labs reviewed: Acceptable for surgery. Mildly elevated K noted. No CKD   (all labs ordered are listed, but only abnormal results are displayed)  Labs Reviewed  BASIC METABOLIC PANEL - Abnormal; Notable for the following components:      Result Value   Potassium 5.7 (*)    Glucose, Bld 130 (*)    BUN 25 (*)     All other components within normal limits  CBC     IMAGES: CT abdomen 04/19/23:  IMPRESSION: 1. No evidence of acute pancreatitis. 2. Persistent nodular soft tissue fullness in the proximal duodenum. This area is under distended, limiting evaluation. Consider upper endoscopy in further evaluation, as clinically indicated. 3. Aortic atherosclerosis (ICD10-I70.0). Coronary artery calcification.  EKG 02/28/23:  Normal sinus rhythm, rate 69 Incomplete right bundle branch block Left anterior fascicular block  CV:  Echo 08/15/20:  IMPRESSIONS    1. Left ventricular ejection fraction, by estimation, is 60 to 65%. The left ventricle has normal function. The left ventricle demonstrates regional wall motion abnormalities (see scoring diagram/findings for description). There is severe left ventricular hypertrophy. Left ventricular diastolic parameters are indeterminate.  2. Right ventricular systolic function is normal. The right ventricular size is normal. There is normal pulmonary artery systolic pressure. The estimated right ventricular systolic pressure is 31.5 mmHg.  3. The mitral valve has been repaired/replaced. Trivial mitral valve regurgitation. The mean mitral valve gradient is 2.0 mmHg. There is a [employer]. Jude mechanical valve present in the mitral position. Grossly normal function.  4. The aortic valve is tricuspid. Aortic valve regurgitation is trivial. Past Medical History:  Diagnosis Date   ALLERGIC RHINITIS    Arthritis    Cancer (HCC)    skin lesion on right thigh   GERD (gastroesophageal reflux disease)    H/O mitral valve replacement    10  yrs ago   HYPERLIPIDEMIA    Hypertension    Long term current use of anticoagulant    MITRAL VALVE PROLAPSE    Pre-diabetes    PREMATURE VENTRICULAR CONTRACTIONS     Past Surgical History:  Procedure Laterality Date   APPENDECTOMY     CHOLECYSTECTOMY N/A 08/17/2022   Procedure: LAPAROSCOPIC CHOLECYSTECTOMY, NO  CHOLANGIOGRAM;  Surgeon: [name], MD;  [facility];  Service: General;  Laterality: N/A;   COLONOSCOPY N/A 03/29/2014   Procedure: COLONOSCOPY;  Surgeon: Malissa Hippo, MD;  [facility];  Service: Endoscopy;  Laterality: N/A;  855   COLONOSCOPY N/A 06/01/2019   Rehman: ileum normal, diverticulosis in entire colon, external hemorhoids, no specimens   ERCP N/A 08/16/2022   Procedure: ENDOSCOPIC RETROGRADE CHOLANGIOPANCREATOGRAPHY (ERCP);  Surgeon: Kerin Salen, MD;  Location: [facility];  Service: Gastroenterology;  Laterality: N/A;   ESOPHAGOGASTRODUODENOSCOPY N/A 03/29/2014   Procedure: ESOPHAGOGASTRODUODENOSCOPY (EGD);  Surgeon: Malissa Hippo, MD;  [facility];  Service: Endoscopy;  Laterality: N/A;   EYE SURGERY Bilateral 2024   cataract extraction w/ IOL   LITHOTRIPSY  08/16/2022   Procedure: LITHOTRIPSY;  Surgeon: Kerin Salen, MD;  [facility];  Service: Gastroenterology;;   MITRAL VALVE REPLACEMENT  04/07/1999   done in [place]   REMOVAL OF STONES  08/16/2022   Procedure: REMOVAL OF STONES;  Surgeon: Kerin Salen, MD;  [facility];  Service: Gastroenterology;;   SHOULDER SURGERY Left 1970   arthroscopy   SPHINCTEROTOMY  08/16/2022   Procedure: Dennison Mascot;  Surgeon: Kerin Salen, MD;  [facility];  Service: Gastroenterology;;    MEDICATIONS:  Vickie Epley NASAL SPRAY 0.05 % nasal spray   allopurinol (ZYLOPRIM) 300 MG tablet   colchicine 0.6 MG tablet   enoxaparin (LOVENOX) 80 MG/0.8ML injection   ezetimibe-simvastatin (VYTORIN) 10-20 MG tablet   FARXIGA 10 MG TABS tablet   guaiFENesin (MUCINEX) 600 MG 12 hr tablet   icosapent Ethyl (VASCEPA) 1 g capsule   lisinopril (PRINIVIL,ZESTRIL) 40 MG tablet   metFORMIN (GLUCOPHAGE) 500 MG tablet   nebivolol (BYSTOLIC) 5 MG tablet   NEXLETOL 180 MG TABS   omeprazole (PRILOSEC) 40 MG capsule   OVER THE COUNTER MEDICATION   predniSONE (DELTASONE) 10 MG tablet    Psyllium (METAMUCIL PO)   warfarin (COUMADIN) 5 MG tablet   No current facility-administered medications for this encounter.   Marcille Blanco [facility] Short Stay/Anesthesiology [facility] Phone [phone] 05/16/2023 2:25 PM

## 2023-05-16 NOTE — Anesthesia Preprocedure Evaluation (Signed)
Anesthesia Evaluation  Patient identified by MRN, date of birth, ID band Patient awake    Reviewed: Allergy & Precautions, NPO status , Patient's Chart, lab work & pertinent test results  Airway Mallampati: II  TM Distance: >3 FB Neck ROM: Full    Dental no notable dental hx.    Pulmonary neg pulmonary ROS   Pulmonary exam normal        Cardiovascular hypertension, Pt. on medications and Pt. on home beta blockers Normal cardiovascular exam+ Valvular Problems/Murmurs (s/p MVR)      Neuro/Psych negative neurological ROS  negative psych ROS   GI/Hepatic Neg liver ROS,GERD  Medicated and Controlled,,  Endo/Other  negative endocrine ROS    Renal/GU negative Renal ROS     Musculoskeletal  (+) Arthritis ,    Abdominal   Peds  Hematology  (+) Blood dyscrasia (Coumadin)   Anesthesia Other Findings LEFT INGUINAL HERNIA  Reproductive/Obstetrics                             Anesthesia Physical Anesthesia Plan  ASA: 2  Anesthesia Plan: General and Regional   Post-op Pain Management: Regional block*   Induction: Intravenous  PONV Risk Score and Plan: 2 and Ondansetron, Dexamethasone, Midazolam and Treatment may vary due to age or medical condition  Airway Management Planned:   Additional Equipment:   Intra-op Plan:   Post-operative Plan: Extubation in OR  Informed Consent: I have reviewed the patients History and Physical, chart, labs and discussed the procedure including the risks, benefits and alternatives for the proposed anesthesia with the patient or authorized representative who has indicated his/her understanding and acceptance.     Dental advisory given  Plan Discussed with: CRNA  Anesthesia Plan Comments: (PAT note from 1/24 by Sherlie Ban PA-C )        Anesthesia Quick Evaluation

## 2023-05-16 NOTE — Telephone Encounter (Signed)
Left message to return call

## 2023-05-19 ENCOUNTER — Other Ambulatory Visit: Payer: Self-pay

## 2023-05-19 ENCOUNTER — Encounter (HOSPITAL_COMMUNITY): Payer: Self-pay | Admitting: General Surgery

## 2023-05-19 ENCOUNTER — Ambulatory Visit (HOSPITAL_COMMUNITY): Payer: Self-pay | Admitting: Medical

## 2023-05-19 ENCOUNTER — Observation Stay (HOSPITAL_COMMUNITY)
Admission: RE | Admit: 2023-05-19 | Discharge: 2023-05-19 | Disposition: A | Discharge status: Home / Self Care | Payer: Medicare PPO | Attending: General Surgery | Admitting: General Surgery

## 2023-05-19 ENCOUNTER — Ambulatory Visit (HOSPITAL_COMMUNITY): Payer: Medicare PPO | Admitting: Anesthesiology

## 2023-05-19 ENCOUNTER — Encounter (HOSPITAL_COMMUNITY)
Admission: RE | Disposition: A | Discharge status: Home / Self Care | Payer: Self-pay | Source: Home / Self Care | Attending: General Surgery

## 2023-05-19 DIAGNOSIS — I10 Essential (primary) hypertension: Secondary | ICD-10-CM | POA: Insufficient documentation

## 2023-05-19 DIAGNOSIS — Z79899 Other long term (current) drug therapy: Secondary | ICD-10-CM | POA: Diagnosis not present

## 2023-05-19 DIAGNOSIS — Z7984 Long term (current) use of oral hypoglycemic drugs: Secondary | ICD-10-CM | POA: Insufficient documentation

## 2023-05-19 DIAGNOSIS — G8918 Other acute postprocedural pain: Secondary | ICD-10-CM | POA: Diagnosis not present

## 2023-05-19 DIAGNOSIS — K4090 Unilateral inguinal hernia, without obstruction or gangrene, not specified as recurrent: Principal | ICD-10-CM

## 2023-05-19 DIAGNOSIS — Z7901 Long term (current) use of anticoagulants: Secondary | ICD-10-CM | POA: Diagnosis not present

## 2023-05-19 DIAGNOSIS — E119 Type 2 diabetes mellitus without complications: Secondary | ICD-10-CM | POA: Insufficient documentation

## 2023-05-19 HISTORY — PX: INGUINAL HERNIA REPAIR: SHX194

## 2023-05-19 LAB — PROTIME-INR
INR: 1 (ref 0.8–1.2)
Prothrombin Time: 13.4 s (ref 11.4–15.2)

## 2023-05-19 SURGERY — REPAIR, HERNIA, INGUINAL, ADULT
Anesthesia: Regional | Laterality: Left

## 2023-05-19 MED ORDER — LISINOPRIL 20 MG PO TABS: 40.0000 mg | ORAL_TABLET | Freq: Every day | ORAL | Status: DC

## 2023-05-19 MED ORDER — METFORMIN HCL 500 MG PO TABS
500.0000 mg | ORAL_TABLET | Freq: Two times a day (BID) | ORAL | Status: DC
  Administered 2023-05-19: 500 mg via ORAL
  Filled 2023-05-19: qty 1

## 2023-05-19 MED ORDER — SIMVASTATIN 20 MG PO TABS: 20.0000 mg | ORAL_TABLET | Freq: Every day | ORAL | Status: DC

## 2023-05-19 MED ORDER — PROPOFOL 10 MG/ML IV BOLUS
INTRAVENOUS | Status: DC | PRN
  Administered 2023-05-19: 150 mg via INTRAVENOUS

## 2023-05-19 MED ORDER — LACTATED RINGERS IV SOLN: INTRAVENOUS | Status: DC

## 2023-05-19 MED ORDER — GUAIFENESIN ER 600 MG PO TB12: 600.0000 mg | ORAL_TABLET | Freq: Two times a day (BID) | ORAL | Status: DC | PRN

## 2023-05-19 MED ORDER — ONDANSETRON HCL 4 MG/2ML IJ SOLN
INTRAMUSCULAR | Status: DC | PRN
  Administered 2023-05-19: 4 mg via INTRAVENOUS

## 2023-05-19 MED ORDER — DIPHENHYDRAMINE HCL 12.5 MG/5ML PO ELIX: 12.5000 mg | ORAL_SOLUTION | Freq: Four times a day (QID) | ORAL | Status: DC | PRN

## 2023-05-19 MED ORDER — FENTANYL CITRATE (PF) 100 MCG/2ML IJ SOLN
INTRAMUSCULAR | Status: AC
  Filled 2023-05-19: qty 2

## 2023-05-19 MED ORDER — ONDANSETRON 4 MG PO TBDP: 4.0000 mg | ORAL_TABLET | Freq: Four times a day (QID) | ORAL | Status: DC | PRN

## 2023-05-19 MED ORDER — DEXAMETHASONE SODIUM PHOSPHATE 10 MG/ML IJ SOLN
INTRAMUSCULAR | Status: AC
  Filled 2023-05-19: qty 1

## 2023-05-19 MED ORDER — LIDOCAINE HCL (PF) 2 % IJ SOLN
INTRAMUSCULAR | Status: AC
  Filled 2023-05-19: qty 5

## 2023-05-19 MED ORDER — ORAL CARE MOUTH RINSE: 15.0000 mL | Freq: Once | OROMUCOSAL | Status: AC

## 2023-05-19 MED ORDER — SUGAMMADEX SODIUM 200 MG/2ML IV SOLN
INTRAVENOUS | Status: DC | PRN
  Administered 2023-05-19: 200 mg via INTRAVENOUS

## 2023-05-19 MED ORDER — ENOXAPARIN SODIUM 80 MG/0.8ML IJ SOSY: 80.0000 mg | PREFILLED_SYRINGE | Freq: Two times a day (BID) | INTRAMUSCULAR | Status: DC

## 2023-05-19 MED ORDER — FENTANYL CITRATE PF 50 MCG/ML IJ SOSY: 25.0000 ug | PREFILLED_SYRINGE | INTRAMUSCULAR | Status: DC | PRN

## 2023-05-19 MED ORDER — OXYMETAZOLINE HCL 0.05 % NA SOLN: 1.0000 | Freq: Two times a day (BID) | NASAL | Status: DC | PRN

## 2023-05-19 MED ORDER — WARFARIN SODIUM 5 MG PO TABS
10.0000 mg | ORAL_TABLET | Freq: Once | ORAL | Status: AC
  Administered 2023-05-19: 10 mg via ORAL
  Filled 2023-05-19: qty 4

## 2023-05-19 MED ORDER — PROPOFOL 10 MG/ML IV BOLUS
INTRAVENOUS | Status: AC
  Filled 2023-05-19: qty 20

## 2023-05-19 MED ORDER — CHLORHEXIDINE GLUCONATE 0.12 % MT SOLN
15.0000 mL | Freq: Once | OROMUCOSAL | Status: AC
Start: 2023-05-19 — End: 2023-05-19
  Administered 2023-05-19: 15 mL via OROMUCOSAL

## 2023-05-19 MED ORDER — BUPIVACAINE LIPOSOME 1.3 % IJ SUSP
INTRAMUSCULAR | Status: AC
  Filled 2023-05-19: qty 20

## 2023-05-19 MED ORDER — CHLORHEXIDINE GLUCONATE CLOTH 2 % EX PADS: 6.0000 | MEDICATED_PAD | Freq: Once | CUTANEOUS | Status: DC

## 2023-05-19 MED ORDER — METOPROLOL TARTRATE 5 MG/5ML IV SOLN: 5.0000 mg | Freq: Four times a day (QID) | INTRAVENOUS | Status: DC | PRN

## 2023-05-19 MED ORDER — NEBIVOLOL HCL 5 MG PO TABS: 5.0000 mg | ORAL_TABLET | Freq: Every day | ORAL | Status: DC

## 2023-05-19 MED ORDER — ICOSAPENT ETHYL 1 G PO CAPS
2.0000 g | ORAL_CAPSULE | Freq: Two times a day (BID) | ORAL | Status: DC
  Filled 2023-05-19: qty 2

## 2023-05-19 MED ORDER — CEFAZOLIN SODIUM-DEXTROSE 2-4 GM/100ML-% IV SOLN
2.0000 g | INTRAVENOUS | Status: AC
Start: 2023-05-19 — End: 2023-05-19
  Administered 2023-05-19: 2 g via INTRAVENOUS
  Filled 2023-05-19: qty 100

## 2023-05-19 MED ORDER — FENTANYL CITRATE PF 50 MCG/ML IJ SOSY
50.0000 ug | PREFILLED_SYRINGE | Freq: Once | INTRAMUSCULAR | Status: DC
  Filled 2023-05-19: qty 2

## 2023-05-19 MED ORDER — ENSURE SURGERY PO LIQD: 237.0000 mL | Freq: Two times a day (BID) | ORAL | Status: DC

## 2023-05-19 MED ORDER — ROPIVACAINE HCL 5 MG/ML IJ SOLN
INTRAMUSCULAR | Status: DC | PRN
  Administered 2023-05-19: 30 mL via PERINEURAL

## 2023-05-19 MED ORDER — FENTANYL CITRATE (PF) 100 MCG/2ML IJ SOLN
INTRAMUSCULAR | Status: DC | PRN
  Administered 2023-05-19: 100 ug via INTRAVENOUS

## 2023-05-19 MED ORDER — DAPAGLIFLOZIN PROPANEDIOL 10 MG PO TABS
10.0000 mg | ORAL_TABLET | Freq: Every day | ORAL | Status: DC
Start: 2023-05-20 — End: 2023-05-19

## 2023-05-19 MED ORDER — TRAMADOL HCL 50 MG PO TABS: 50.0000 mg | ORAL_TABLET | Freq: Four times a day (QID) | ORAL | Status: DC | PRN

## 2023-05-19 MED ORDER — MORPHINE SULFATE (PF) 2 MG/ML IV SOLN: 2.0000 mg | INTRAVENOUS | Status: DC | PRN

## 2023-05-19 MED ORDER — PHENYLEPHRINE 80 MCG/ML (10ML) SYRINGE FOR IV PUSH (FOR BLOOD PRESSURE SUPPORT)
PREFILLED_SYRINGE | INTRAVENOUS | Status: AC
  Filled 2023-05-19: qty 10

## 2023-05-19 MED ORDER — ONDANSETRON HCL 4 MG/2ML IJ SOLN: 4.0000 mg | Freq: Once | INTRAMUSCULAR | Status: DC | PRN

## 2023-05-19 MED ORDER — ONDANSETRON HCL 4 MG/2ML IJ SOLN
INTRAMUSCULAR | Status: AC
  Filled 2023-05-19: qty 2

## 2023-05-19 MED ORDER — BUPIVACAINE HCL (PF) 0.5 % IJ SOLN
INTRAMUSCULAR | Status: AC
  Filled 2023-05-19: qty 30

## 2023-05-19 MED ORDER — DIPHENHYDRAMINE HCL 50 MG/ML IJ SOLN: 12.5000 mg | Freq: Four times a day (QID) | INTRAMUSCULAR | Status: DC | PRN

## 2023-05-19 MED ORDER — PREDNISONE 5 MG PO TABS: 10.0000 mg | ORAL_TABLET | Freq: Every day | ORAL | Status: DC | PRN

## 2023-05-19 MED ORDER — MIDAZOLAM HCL 2 MG/2ML IJ SOLN
1.0000 mg | Freq: Once | INTRAMUSCULAR | Status: AC
  Administered 2023-05-19: 1 mg via INTRAVENOUS
  Filled 2023-05-19: qty 2

## 2023-05-19 MED ORDER — EZETIMIBE-SIMVASTATIN 10-20 MG PO TABS: 1.0000 | ORAL_TABLET | Freq: Every day | ORAL | Status: DC

## 2023-05-19 MED ORDER — WARFARIN - PHYSICIAN DOSING INPATIENT: Freq: Every day | Status: DC

## 2023-05-19 MED ORDER — DEXAMETHASONE SODIUM PHOSPHATE 10 MG/ML IJ SOLN
INTRAMUSCULAR | Status: DC | PRN
  Administered 2023-05-19: 4 mg via INTRAVENOUS

## 2023-05-19 MED ORDER — PANTOPRAZOLE SODIUM 40 MG PO TBEC: 40.0000 mg | DELAYED_RELEASE_TABLET | Freq: Every day | ORAL | Status: DC

## 2023-05-19 MED ORDER — EZETIMIBE 10 MG PO TABS: 10.0000 mg | ORAL_TABLET | Freq: Every day | ORAL | Status: DC

## 2023-05-19 MED ORDER — ROCURONIUM BROMIDE 10 MG/ML (PF) SYRINGE
PREFILLED_SYRINGE | INTRAVENOUS | Status: DC | PRN
  Administered 2023-05-19: 60 mg via INTRAVENOUS

## 2023-05-19 MED ORDER — ACETAMINOPHEN 500 MG PO TABS
1000.0000 mg | ORAL_TABLET | ORAL | Status: AC
  Administered 2023-05-19: 1000 mg via ORAL
  Filled 2023-05-19: qty 2

## 2023-05-19 MED ORDER — BEMPEDOIC ACID 180 MG PO TABS: 180.0000 mg | ORAL_TABLET | Freq: Every day | ORAL | Status: DC

## 2023-05-19 MED ORDER — LIDOCAINE 2% (20 MG/ML) 5 ML SYRINGE
INTRAMUSCULAR | Status: DC | PRN
  Administered 2023-05-19: 40 mg via INTRAVENOUS

## 2023-05-19 MED ORDER — ENOXAPARIN SODIUM 80 MG/0.8ML IJ SOSY
80.0000 mg | PREFILLED_SYRINGE | Freq: Two times a day (BID) | INTRAMUSCULAR | Status: DC
Start: 2023-05-19 — End: 2023-05-19

## 2023-05-19 MED ORDER — 0.9 % SODIUM CHLORIDE (POUR BTL) OPTIME
TOPICAL | Status: DC | PRN
  Administered 2023-05-19: 1000 mL

## 2023-05-19 MED ORDER — AMISULPRIDE (ANTIEMETIC) 5 MG/2ML IV SOLN: 10.0000 mg | Freq: Once | INTRAVENOUS | Status: DC | PRN

## 2023-05-19 MED ORDER — OXYCODONE HCL 5 MG PO TABS: 5.0000 mg | ORAL_TABLET | Freq: Four times a day (QID) | ORAL | 0 refills | Status: DC | PRN

## 2023-05-19 MED ORDER — PHENYLEPHRINE 80 MCG/ML (10ML) SYRINGE FOR IV PUSH (FOR BLOOD PRESSURE SUPPORT)
PREFILLED_SYRINGE | INTRAVENOUS | Status: DC | PRN
  Administered 2023-05-19 (×6): 80 ug via INTRAVENOUS

## 2023-05-19 MED ORDER — ALLOPURINOL 300 MG PO TABS: 300.0000 mg | ORAL_TABLET | Freq: Every day | ORAL | Status: DC

## 2023-05-19 MED ORDER — ONDANSETRON HCL 4 MG/2ML IJ SOLN: 4.0000 mg | Freq: Four times a day (QID) | INTRAMUSCULAR | Status: DC | PRN

## 2023-05-19 MED ORDER — OXYCODONE HCL 5 MG PO TABS
5.0000 mg | ORAL_TABLET | ORAL | Status: DC | PRN
  Administered 2023-05-19: 10 mg via ORAL
  Filled 2023-05-19: qty 2

## 2023-05-19 MED ORDER — COLCHICINE 0.6 MG PO TABS: 0.6000 mg | ORAL_TABLET | Freq: Every day | ORAL | Status: DC | PRN

## 2023-05-19 SURGICAL SUPPLY — 37 items
BAG COUNTER SPONGE SURGICOUNT (BAG) ×2 IMPLANT
BENZOIN TINCTURE PRP APPL 2/3 (GAUZE/BANDAGES/DRESSINGS) IMPLANT
BLADE SURG 15 STRL LF DISP TIS (BLADE) ×2 IMPLANT
CHLORAPREP W/TINT 26 (MISCELLANEOUS) ×2 IMPLANT
COVER SURGICAL LIGHT HANDLE (MISCELLANEOUS) ×2 IMPLANT
DERMABOND ADVANCED .7 DNX12 (GAUZE/BANDAGES/DRESSINGS) ×2 IMPLANT
DRAIN PENROSE 0.5X18 (DRAIN) IMPLANT
DRAPE LAPAROTOMY TRNSV 102X78 (DRAPES) ×2 IMPLANT
DRAPE UTILITY XL STRL (DRAPES) ×2 IMPLANT
DRSG TELFA PLUS 4X6 ADH ISLAND (GAUZE/BANDAGES/DRESSINGS) IMPLANT
ELECT REM PT RETURN 15FT ADLT (MISCELLANEOUS) ×2 IMPLANT
GAUZE SPONGE 4X4 12PLY STRL (GAUZE/BANDAGES/DRESSINGS) IMPLANT
GLOVE BIOGEL PI IND STRL 7.0 (GLOVE) IMPLANT
GLOVE SURG SS PI 7.0 STRL IVOR (GLOVE) ×2 IMPLANT
GOWN STRL REUS W/ TWL LRG LVL3 (GOWN DISPOSABLE) ×2 IMPLANT
GOWN STRL REUS W/ TWL XL LVL3 (GOWN DISPOSABLE) IMPLANT
KIT BASIN OR (CUSTOM PROCEDURE TRAY) ×2 IMPLANT
KIT TURNOVER KIT A (KITS) IMPLANT
MARKER SKIN DUAL TIP RULER LAB (MISCELLANEOUS) ×2 IMPLANT
MESH HERNIA 3X6 (Mesh General) IMPLANT
NDL HYPO 22X1.5 SAFETY MO (MISCELLANEOUS) ×2 IMPLANT
NEEDLE HYPO 22X1.5 SAFETY MO (MISCELLANEOUS) ×1 IMPLANT
PACK BASIC VI WITH GOWN DISP (CUSTOM PROCEDURE TRAY) ×2 IMPLANT
PENCIL SMOKE EVACUATOR (MISCELLANEOUS) ×2 IMPLANT
SPIKE FLUID TRANSFER (MISCELLANEOUS) ×2 IMPLANT
SPONGE T-LAP 4X18 ~~LOC~~+RFID (SPONGE) ×2 IMPLANT
STRIP CLOSURE SKIN 1/2X4 (GAUZE/BANDAGES/DRESSINGS) IMPLANT
SUT MNCRL AB 4-0 PS2 18 (SUTURE) ×2 IMPLANT
SUT PDS AB 2-0 CT2 27 (SUTURE) ×2 IMPLANT
SUT PROLENE 2 0 CT2 30 (SUTURE) ×4 IMPLANT
SUT VIC AB 3-0 SH 18 (SUTURE) IMPLANT
SUT VIC AB 3-0 SH 27XBRD (SUTURE) IMPLANT
SUT VICRYL 3-0 CR8 SH (SUTURE) ×2 IMPLANT
SYR BULB IRRIG 60ML STRL (SYRINGE) ×2 IMPLANT
SYR CONTROL 10ML LL (SYRINGE) ×2 IMPLANT
TOWEL OR 17X26 10 PK STRL BLUE (TOWEL DISPOSABLE) ×2 IMPLANT
YANKAUER SUCT BULB TIP 10FT TU (MISCELLANEOUS) IMPLANT

## 2023-05-19 NOTE — Transfer of Care (Signed)
Immediate Anesthesia Transfer of Care Note  Patient: Luis Natal, PhD  Procedure(s) Performed: LEFT OPEN INGUINAL HERNIA REPAIR WITH MESH (Left)  Patient Location: PACU  Anesthesia Type:General  Level of Consciousness: drowsy  Airway & Oxygen Therapy: Patient Spontanous Breathing and Patient connected to face mask oxygen  Post-op Assessment: Report given to RN, Post -op Vital signs reviewed and stable, and Patient moving all extremities X 4  Post vital signs: Reviewed and stable  Last Vitals:  Vitals Value Taken Time  BP 121/75   Temp    Pulse 68   Resp 12   SpO2 98     Last Pain:  Vitals:   05/19/23 0845  TempSrc:   PainSc: 0-No pain         Complications: No notable events documented.

## 2023-05-19 NOTE — Anesthesia Procedure Notes (Signed)
Procedure Name: Intubation Date/Time: 05/19/2023 9:11 AM  Performed by: Nelle Don, CRNAPre-anesthesia Checklist: Patient identified, Emergency Drugs available, Suction available and Patient being monitored Patient Re-evaluated:Patient Re-evaluated prior to induction Oxygen Delivery Method: Circle system utilized Preoxygenation: Pre-oxygenation with 100% oxygen Induction Type: IV induction Ventilation: Mask ventilation without difficulty Laryngoscope Size: Mac and 4 Grade View: Grade II Tube type: Oral Tube size: 7.5 mm Number of attempts: 1 Airway Equipment and Method: Stylet Placement Confirmation: ETT inserted through vocal cords under direct vision, positive ETCO2 and breath sounds checked- equal and bilateral Secured at: 23 cm Tube secured with: Tape Dental Injury: Teeth and Oropharynx as per pre-operative assessment

## 2023-05-19 NOTE — Progress Notes (Signed)
AVS reviewed w/ pt & wife - both verbalized an understanding. No other questions at this time. PIV removed as noted. Pt dressing for d/c to home. Pt to lobby via w/c - home w/ wife

## 2023-05-19 NOTE — Op Note (Signed)
Preop diagnosis: left inguinal hernia  Postop diagnosis: left direct inguinal hernia  Procedure: open Left inguinal hernia repair with mesh  Surgeon: Feliciana Rossetti, M.D.  Asst: none  Anesthesia: Gen.   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 After discussing risks, alternatives and benefits he decided on open repair and was brought to day surgery for repair.  Description of procedure: The patient was brought into the operative suite, placed supine. Anesthesia was administered with endotracheal tube. Patient was strapped in place. The patient was prepped and draped in the usual sterile fashion.  The anterior superior iliac spine and pubic tubercle were identified on the Left side. An incision was made 1cm above the connecting line, representative of the location of the inguinal ligament. The subcutaneous tissue was bluntly dissected, scarpa's fascia was dissected away. The external abdominal oblique fascia was identified and sharply opened down to the external inguinal ring. The conjoint tendon and inguinal ligament were identified. The cord structures and sac were dissected free of the surrounding tissue in 360 degrees. A penrose drain was used to encircle the contents. The cremasteric fibers were dissected free of the contents of the cord and hernia sac. The cord structures (vessels and vas deferens) were identified and carefully dissected away from the hernia sac. The hernia sac was reduced and contained no visceral structures.The hernia sac was dissected down to the internal inguinal ring. Preperitoneal fat was identified showing appropriate dissection. The sac was then reduced into the preperitoneal space. The hernia was direct. Interrupted 2-0 PDS was used to appose the conjoint tendon to the inguinal ligament. A 3x6 Bard mesh was then used to close the defect and reinforce the floor. The mesh was sutured  to the lacunar ligament and inguinal ligament using a 2-0 prolene in running fashion. Next the superior edge of the mesh was sutured to the conjoined tendon using a 2-0 running Prolene. An additional 2-0 Prolene was used to suture the tail ends of the mesh together re-creating the deep ring. Cord structures are running in a neutral position through the mesh. Next the external abdominal oblique fascia was closed with a 2-0 Vicryl in interrupted fashion to re-create the external inguinal ring. Scarpa's fascia was closed with 3-0 Vicryl in running fashion. Skin was closed with a 4-0 Monocryl subcuticular stitch in running fashion. Dermabond place for dressing. Patient woke from anesthesia and brought to PACU in stable condition. All counts are correct.    Findings: left direct inguinal hernia  Specimen: none  Blood loss: 20 ml  Local anesthesia: none  Complications: none  Implant: 3 x 6 in bard mesh  Feliciana Rossetti, M.D. General, Bariatric, & Minimally Invasive Surgery [facility], [place] 10:19 AM 05/19/2023

## 2023-05-19 NOTE — Anesthesia Procedure Notes (Signed)
Anesthesia Regional Block: TAP block   Pre-Anesthetic Checklist: , timeout performed,  Correct Patient, Correct Site, Correct Laterality,  Correct Procedure, Correct Position, site marked,  Risks and benefits discussed,  Surgical consent,  Pre-op evaluation,  At surgeon's request and post-op pain management  Laterality: Left  Prep: chloraprep       Needles:  Injection technique: Single-shot  Needle Type: Echogenic Stimulator Needle     Needle Length: 10cm  Needle Gauge: 20     Additional Needles:   Procedures:,,,, ultrasound used (permanent image in chart),,    Narrative:  Start time: 05/19/2023 8:35 AM End time: 05/19/2023 8:45 AM Injection made incrementally with aspirations every 5 mL.  Performed by: Personally  Anesthesiologist: Leonides Grills, MD  Additional Notes: Functioning IV was confirmed and monitors were applied.  A timeout was performed. Sterile prep, hand hygiene and sterile gloves were used. A 20ga Bbraun echogenic stimulator needle was used. Negative aspiration and negative test dose prior to incremental administration of local anesthetic. The patient tolerated the procedure well.  Ultrasound guidance: relevent anatomy identified, needle position confirmed, local anesthetic spread visualized around nerve(s), vascular puncture avoided.  Image printed for medical record.

## 2023-05-19 NOTE — Discharge Instructions (Signed)
[facility] _______[facility]  UMBILICAL OR INGUINAL HERNIA REPAIR: POST OP INSTRUCTIONS  Always review your discharge instruction sheet given to you by the facility where your surgery was performed. IF YOU HAVE DISABILITY OR FAMILY LEAVE FORMS, YOU MUST BRING THEM TO THE OFFICE FOR PROCESSING.   DO NOT GIVE THEM TO YOUR DOCTOR.  1. A  prescription for pain medication may be given to you upon discharge.  Take your pain medication as prescribed, if needed.  If narcotic pain medicine is not needed, then you may take acetaminophen (Tylenol) or ibuprofen (Advil) as needed. 2. Take your usually prescribed medications unless otherwise directed. If you need a refill on your pain medication, please contact your pharmacy.  They will contact our office to request authorization. Prescriptions will not be filled after 5 pm or on week-ends. 3. You should follow a light diet the first 24 hours after arrival home, such as soup and crackers, etc.  Be sure to include lots of fluids daily.  Resume your normal diet the day after surgery. 4.Most patients will experience some swelling and bruising around the umbilicus or in the groin and scrotum.  Ice packs and reclining will help.  Swelling and bruising can take several days to resolve.  6. It is common to experience some constipation if taking pain medication after surgery.  Increasing fluid intake and taking a stool softener (such as Colace) will usually help or prevent this problem from occurring.  A mild laxative (Milk of Magnesia or Miralax) should be taken according to package directions if there are no bowel movements after 48 hours. 7. Unless discharge instructions indicate otherwise, you may remove your bandages 24-48 hours after surgery, and you may shower at that time.  You may have steri-strips (small skin tapes) in place directly over the incision.  These strips should be left on the skin for 7-10 days.  If your surgeon used skin glue on the  incision, you may shower in 24 hours.  The glue will flake off over the next 2-3 weeks.  Any sutures or staples will be removed at the office during your follow-up visit. 8. ACTIVITIES:  You may resume regular (light) daily activities beginning the next day--such as daily self-care, walking, climbing stairs--gradually increasing activities as tolerated.  You may have sexual intercourse when it is comfortable.  Refrain from any heavy lifting or straining until approved by your doctor.  a.You may drive when you are no longer taking prescription pain medication, you can comfortably wear a seatbelt, and you can safely maneuver your car and apply brakes. b.RETURN TO WORK:   _____________________________________________  9.You should see your doctor in the office for a follow-up appointment approximately 2-3 weeks after your surgery.  Make sure that you call for this appointment within a day or two after you arrive home to insure a convenient appointment time. 10.OTHER INSTRUCTIONS: _________________________    _____________________________________  WHEN TO CALL YOUR DOCTOR: Fever over 101.0 Inability to urinate Nausea and/or vomiting Extreme swelling or bruising Continued bleeding from incision. Increased pain, redness, or drainage from the incision  The clinic staff is available to answer your questions during regular business hours.  Please don't hesitate to call and ask to speak to one of the nurses for clinical concerns.  If you have a medical emergency, go to the nearest emergency room or call 911.  A surgeon from [facility] is always on call at the hospital   [address], [address],  [place], [place]  [address] ?  [address] [address], [place], [place]   [address] [phone] ? [phone] ? FAX [phone] Web site: [email or web address]

## 2023-05-19 NOTE — H&P (Signed)
Chief Complaint: Inguinal Hernia (Left)   History of Present Illness: Luis Haynes is a 73 y.o. male who is seen today as an office consultation at the request of Dr. Gerrit Friends for evaluation of Inguinal Hernia (Left) .  He first noticed the hernia years ago. Symptoms are minimal but it continues to enlarge. He denies nausea or vomiting or bowel habit change.  He does not smoke He does not have diabetes He has no history of hernias  He has a mechanical mitral valve and is on coumadin for that.  Review of Systems: A complete review of systems was obtained from the patient. I have reviewed this information and discussed as appropriate with the patient. See HPI as well for other ROS.  Review of Systems  Constitutional: Negative.  HENT: Negative.  Eyes: Negative.  Respiratory: Negative.  Cardiovascular: Negative.  Gastrointestinal: Negative.  Genitourinary: Negative.  Musculoskeletal: Negative.  Skin: Negative.  Neurological: Negative.  Endo/Heme/Allergies: Negative.  Psychiatric/Behavioral: Negative.   Medical History: Past Medical History:  Diagnosis Date  Arthritis  Diabetes mellitus without complication (CMS/HHS-HCC)  Hypertension   There is no problem list on file for this patient.  Past Surgical History:  Procedure Laterality Date  CHOLECYSTECTOMY 08/17/2022   Allergies  Allergen Reactions  Erythromycin Nausea, Nausea And Vomiting and Vomiting  GI Intolerance  Ezetimibe-Simvastatin Other (See Comments)  LEG CRAMPS  Sulfamethoxazole Nausea and Vomiting  GI Intolerance   Current Outpatient Medications on File Prior to Visit  Medication Sig Dispense Refill  allopurinoL (ZYLOPRIM) 300 MG tablet Take 300 mg by mouth once daily  lisinopriL (ZESTRIL) 40 MG tablet Take by mouth  metFORMIN (GLUCOPHAGE) 500 MG tablet Take by mouth  montelukast (SINGULAIR) 10 mg tablet Take 10 mg by mouth once daily  nebivoloL (BYSTOLIC) 5 MG tablet Take 5 mg by mouth once daily   NEXLETOL 180 mg tablet Take 180 mg by mouth once daily  RYBELSUS 14 mg tablet Take by mouth  warfarin (COUMADIN) 5 MG tablet TAKE 1 TABLET BY MOUTH EVERY DAY OR AS DIRECTED   No current facility-administered medications on file prior to visit.   History reviewed. No pertinent family history.  Social History   Tobacco Use  Smoking Status Never  Smokeless Tobacco Never   Social History   Socioeconomic History  Marital status: Married  Tobacco Use  Smoking status: Never  Smokeless tobacco: Never  Substance and Sexual Activity  Alcohol use: Yes  Drug use: Never   Social Determinants of Health   Food Insecurity: No Food Insecurity (08/12/2022)  Received from [facility]  Hunger Vital Sign  Worried About Running Out of Food in the Last Year: Never true  Ran Out of Food in the Last Year: Never true  Transportation Needs: No Transportation Needs (08/12/2022)  Received from [facility]  Lack of Transportation (Medical): No  Lack of Transportation (Non-Medical): No   Objective:   Vitals:  12/22/22 0938  Pulse: 65  Temp: 36.7 C (98 F)  SpO2: 97%  Weight: 78.4 kg (172 lb 12.8 oz)  Height: 185.4 cm (6\' 1" )  PainSc: 4   Body mass index is 22.8 kg/m.  Physical Exam Constitutional:  Appearance: Normal appearance.  HENT:  Head: Normocephalic and atraumatic.  Pulmonary:  Effort: Pulmonary effort is normal.  Abdominal:  Comments: Large left sided inguinal hernia, soft  Musculoskeletal:  General: Normal range of motion.  Cervical back: Normal range of motion.  Neurological:  General: No focal deficit present.  Mental  Status: He is alert and oriented to person, place, and time. Mental status is at baseline.  Psychiatric:  Mood and Affect: Mood normal.  Behavior: Behavior normal.  Thought Content: Thought content normal.   Labs, Imaging and Diagnostic Testing: I reivewed notes by Vashti Hey and Ermalinda Memos  Assessment and Plan:   Diagnoses and all orders for this visit:  Non-recurrent unilateral inguinal hernia without obstruction or gangrene  Mitral valve replaced  Anticoagulated on Coumadin  We discussed etiology of hernias and how they can cause pain. We discussed options for inguinal hernia repair vs observation. We discussed details of the surgery of general anesthesia, surgical approach and incisions, dissecting the sack away from vas deference, testicular vessels and nerves and placement of mesh. We discussed risks of bleeding, infection, recurrence, injury to vas deference, testicular vessels, nerve injury, and chronic pain. He showed good understanding and wanted proceed with open left inguinal hernia repair with overnight stay.   05/19/23  Hernia Repair   Labs:  01/26/23  Hgb 13.4  Hct 43.4  Plts 283  Scr 0.94   04/19/23  Scr 1.3   (repeat labs on 1.24.25 at pre-op appt)   Wt 84kg   Lovenox 80mg  twice daily at 7am and 7pm .  Rx sent to [facility]  #20 syringes   1/24  Last dose of warfarin 1/25  No lovenox or warfarin 1/26 - 1/28  Lovenox 80mg  twice daily SQ at 7am & 7pm 1/29  Lovenox 80mg  @7am  ----- NO Lovenox pm 1/30  NO lovenox in AM  ---- Surgery ----Warfarin 10mg  pm 1/31   Lovenox 80mg  twice daily SQ at 7am & 7pm and warfarin 7.5mg  pm 2/1 - 2/2 Lovenox 80mg  twice daily SQ at 7am & 7pm and warfarin 7.5mg  pm 2/3 Lovenox 80mg  twice daily SQ at 7am & 7pm and warfarin 5mg  pm 2/4  Lovenox 80mg  twice daily SQ at 7am & 7pm and warfarin 7.5mg  pm 2/5 Lovenox 80mg  am  --------- INR appt

## 2023-05-19 NOTE — Discharge Summary (Signed)
Physician Discharge Summary  Luis Natal, PhD ION:[id number] DOB: [birth year] DOA: 05/19/2023  PCP: [name], MD  Admit date: 05/19/2023 Discharge date:  05/19/2023   Recommendations for Outpatient Follow-up:   (include homehealth, outpatient follow-up instructions, specific recommendations for PCP to follow-up on, etc.)   Follow-up Information     Stuart Guillen, De Blanch, MD Follow up on 06/09/2023.   Specialty: General Surgery Contact information: [address] [place] [address] [phone]                Discharge Diagnoses:  Principal Problem:   Left inguinal hernia   Surgical Procedure: left open inguinal hernia repair  Discharge Condition: Good Disposition: Home  Diet recommendation: reg diet   Hospital Course:  73 yo male with symptomatic left inguinal hernia underwent open repair. He was observed post op. He was discharged home POD 0.  Discharge Instructions  Discharge Instructions     Diet - low sodium heart healthy   Complete by: As directed    Discharge wound care:   Complete by: As directed    Shower normal tomorrow. Glue to stay on for 10-14 days. No bandage needed.   Increase activity slowly   Complete by: As directed       Allergies as of 05/19/2023       Reactions   Erythromycin Other (See Comments)   GI Intolerance   Sulfamethoxazole Other (See Comments)   GI Intolerance   Vytorin [ezetimibe-simvastatin] Other (See Comments)   LEG CRAMPS        Medication List     TAKE these medications    Afrin Nasal Spray 0.05 % nasal spray Generic drug: oxymetazoline Place 1 spray into both nostrils 2 (two) times daily as needed for congestion.   allopurinol 300 MG tablet Commonly known as: ZYLOPRIM Take 300 mg by mouth daily.   colchicine 0.6 MG tablet Take 0.6 mg by mouth daily as needed (gout flares).   enoxaparin 80 MG/0.8ML injection Commonly known as: LOVENOX Inject 0.8 mLs (80 mg total) into the  skin every 12 (twelve) hours.   ezetimibe-simvastatin 10-20 MG tablet Commonly known as: VYTORIN Take 1 tablet by mouth at bedtime.   Farxiga 10 MG Tabs tablet Generic drug: dapagliflozin propanediol Take 10 mg by mouth daily.   icosapent Ethyl 1 g capsule Commonly known as: VASCEPA Take 2 g by mouth 2 (two) times daily.   lisinopril 40 MG tablet Commonly known as: ZESTRIL Take 40 mg by mouth daily.   METAMUCIL PO Take 4 capsules by mouth daily with breakfast.   metFORMIN 500 MG tablet Commonly known as: GLUCOPHAGE Take 500 mg by mouth in the morning and at bedtime.   Mucinex 600 MG 12 hr tablet Generic drug: guaiFENesin Take 600 mg by mouth 2 (two) times daily as needed for cough or to loosen phlegm.   nebivolol 5 MG tablet Commonly known as: BYSTOLIC Take 5 mg by mouth in the morning and at bedtime.   Nexletol 180 MG Tabs Generic drug: Bempedoic Acid Take 180 mg by mouth daily.   omeprazole 40 MG capsule Commonly known as: PRILOSEC TAKE 1 CAPSULE(40 MG) BY MOUTH DAILY   OVER THE COUNTER MEDICATION Take 1-2 tablets by mouth See admin instructions. TYLENOL Sinus + Headache Non-Drowsy Daytime Caplets for Nasal Congestion, Sinus Pressure & Pain Relief- Take 1-2 caplets by mouth every six hours as needed for migraines   oxyCODONE 5 MG immediate release tablet Commonly known as: Oxy IR/ROXICODONE Take  1 tablet (5 mg total) by mouth every 6 (six) hours as needed for severe pain (pain score 7-10).   predniSONE 10 MG tablet Commonly known as: DELTASONE Take 10 mg by mouth daily as needed (gout flares).   warfarin 5 MG tablet Commonly known as: COUMADIN Take as directed. If you are unsure how to take this medication, talk to your nurse or doctor. Original instructions: TAKE 1-2 TABLETS BY MOUTH EVERY DAY OR AS DIRECTED BY COUMADIN CLINIC What changed:  how much to take how to take this when to take this additional instructions               Discharge  Care Instructions  (From admission, onward)           Start     Ordered   05/19/23 0000  Discharge wound care:       Comments: Shower normal tomorrow. Glue to stay on for 10-14 days. No bandage needed.   05/19/23 1617            Follow-up Information     Kamyrah Feeser, De Blanch, MD Follow up on 06/09/2023.   Specialty: General Surgery Contact information: [address] [place] [address] [phone]                  The results of significant diagnostics from this hospitalization (including imaging, microbiology, ancillary and laboratory) are listed below for reference.    Significant Diagnostic Studies: No results found.  Labs: Basic Metabolic Panel: Recent Labs  Lab 05/13/23 0857  NA 141  K 5.7*  CL 106  CO2 24  GLUCOSE 130*  BUN 25*  CREATININE 1.03  CALCIUM 9.8   Liver Function Tests: No results for input(s): "AST", "ALT", "ALKPHOS", "BILITOT", "PROT", "ALBUMIN" in the last 168 hours.  CBC: Recent Labs  Lab 05/13/23 0857  WBC 7.2  HGB 14.3  HCT 47.6  MCV 89.5  PLT 283    CBG: No results for input(s): "GLUCAP" in the last 168 hours.  Principal Problem:   Left inguinal hernia   Time coordinating discharge: 15 min

## 2023-05-19 NOTE — Anesthesia Postprocedure Evaluation (Signed)
Anesthesia Post Note  Patient: [name], PhD  Procedure(s) Performed: LEFT OPEN INGUINAL HERNIA REPAIR WITH MESH (Left)     Patient location during evaluation: PACU Anesthesia Type: Regional and General Level of consciousness: awake Pain management: pain level controlled Vital Signs Assessment: post-procedure vital signs reviewed and stable Respiratory status: spontaneous breathing, nonlabored ventilation and respiratory function stable Cardiovascular status: blood pressure returned to baseline and stable Postop Assessment: no apparent nausea or vomiting Anesthetic complications: no   No notable events documented.  Last Vitals:  Vitals:   05/19/23 1457 05/19/23 1551  BP: 120/70 135/78  Pulse: 78 77  Resp: 16   Temp: 36.6 C 36.9 C  SpO2: 92% 92%    Last Pain:  Vitals:   05/19/23 1551  TempSrc: Oral  PainSc:                  Gaelan Glennon P Gertrude Bucks

## 2023-05-20 ENCOUNTER — Encounter (HOSPITAL_COMMUNITY): Payer: Self-pay | Admitting: General Surgery

## 2023-05-24 ENCOUNTER — Other Ambulatory Visit: Payer: Self-pay | Admitting: Cardiovascular Disease

## 2023-05-25 ENCOUNTER — Ambulatory Visit: Payer: Medicare PPO | Attending: Cardiovascular Disease | Admitting: *Deleted

## 2023-05-25 DIAGNOSIS — Z5181 Encounter for therapeutic drug level monitoring: Secondary | ICD-10-CM

## 2023-05-25 DIAGNOSIS — I059 Rheumatic mitral valve disease, unspecified: Secondary | ICD-10-CM | POA: Diagnosis not present

## 2023-05-25 DIAGNOSIS — Z952 Presence of prosthetic heart valve: Secondary | ICD-10-CM | POA: Diagnosis not present

## 2023-05-25 LAB — POCT INR: POC INR: 1.9

## 2023-05-25 MED ORDER — ENOXAPARIN SODIUM 80 MG/0.8ML IJ SOSY: 80.0000 mg | PREFILLED_SYRINGE | Freq: Two times a day (BID) | INTRAMUSCULAR | 0 refills | Status: DC

## 2023-05-25 NOTE — Patient Instructions (Signed)
 Description   -Take 1.5 tablets of warfarin today (7.5mg ) -Tomorrow take 2 tablets of warfarin ( 10mg ) - Continue Lovenox  injections 80mg  BID.  -Then continue warfarin 1 1/2 tablets daily except 1 tablet on Mondays, Wednesdays and Fridays. -Recheck INR on Monday 05/30/2023

## 2023-05-30 ENCOUNTER — Ambulatory Visit: Payer: Medicare PPO | Attending: Cardiovascular Disease | Admitting: *Deleted

## 2023-05-30 DIAGNOSIS — Z5181 Encounter for therapeutic drug level monitoring: Secondary | ICD-10-CM | POA: Diagnosis not present

## 2023-05-30 DIAGNOSIS — E1165 Type 2 diabetes mellitus with hyperglycemia: Secondary | ICD-10-CM | POA: Diagnosis not present

## 2023-05-30 DIAGNOSIS — Z952 Presence of prosthetic heart valve: Secondary | ICD-10-CM | POA: Diagnosis not present

## 2023-05-30 LAB — POCT INR: INR: 2.6 (ref 2.0–3.0)

## 2023-05-30 NOTE — Patient Instructions (Signed)
 Continue warfarin 1 1/2 tablets daily except 1 tablet on Mondays, Wednesdays and Fridays. Stop Lovenox  -Recheck INR on Tuesday 06/14/23

## 2023-06-10 ENCOUNTER — Other Ambulatory Visit (HOSPITAL_COMMUNITY): Payer: Medicare PPO

## 2023-06-10 DIAGNOSIS — J069 Acute upper respiratory infection, unspecified: Secondary | ICD-10-CM | POA: Diagnosis not present

## 2023-06-14 ENCOUNTER — Ambulatory Visit: Payer: Medicare PPO | Attending: Cardiovascular Disease | Admitting: *Deleted

## 2023-06-14 ENCOUNTER — Encounter (HOSPITAL_COMMUNITY): Admission: RE | Payer: Self-pay | Source: Home / Self Care

## 2023-06-14 ENCOUNTER — Ambulatory Visit (HOSPITAL_COMMUNITY): Admission: RE | Admit: 2023-06-14 | Payer: Medicare PPO | Source: Home / Self Care | Admitting: Gastroenterology

## 2023-06-14 DIAGNOSIS — Z5181 Encounter for therapeutic drug level monitoring: Secondary | ICD-10-CM

## 2023-06-14 DIAGNOSIS — Z952 Presence of prosthetic heart valve: Secondary | ICD-10-CM

## 2023-06-14 LAB — POCT INR: INR: 4.8 — AB (ref 2.0–3.0)

## 2023-06-14 SURGERY — ESOPHAGOGASTRODUODENOSCOPY (EGD) WITH PROPOFOL
Anesthesia: Monitor Anesthesia Care

## 2023-06-14 NOTE — Patient Instructions (Signed)
 Hold warfarin tonight then resume 1 1/2 tablets daily except 1 tablet on Mondays, Wednesdays and Fridays. Eat extra greens today -Recheck INR in 2 wks

## 2023-06-16 DIAGNOSIS — E782 Mixed hyperlipidemia: Secondary | ICD-10-CM | POA: Diagnosis not present

## 2023-06-16 DIAGNOSIS — M1A00X0 Idiopathic chronic gout, unspecified site, without tophus (tophi): Secondary | ICD-10-CM | POA: Diagnosis not present

## 2023-06-16 DIAGNOSIS — E1169 Type 2 diabetes mellitus with other specified complication: Secondary | ICD-10-CM | POA: Diagnosis not present

## 2023-06-17 LAB — LAB REPORT - SCANNED
A1c: 6.6
Creatinine, POC: 47.4 mg/dL
EGFR: 82

## 2023-06-21 ENCOUNTER — Encounter: Payer: Self-pay | Admitting: Internal Medicine

## 2023-06-21 DIAGNOSIS — K219 Gastro-esophageal reflux disease without esophagitis: Secondary | ICD-10-CM | POA: Diagnosis not present

## 2023-06-21 DIAGNOSIS — M542 Cervicalgia: Secondary | ICD-10-CM | POA: Diagnosis not present

## 2023-06-21 DIAGNOSIS — E782 Mixed hyperlipidemia: Secondary | ICD-10-CM | POA: Diagnosis not present

## 2023-06-21 DIAGNOSIS — I10 Essential (primary) hypertension: Secondary | ICD-10-CM | POA: Diagnosis not present

## 2023-06-21 DIAGNOSIS — M810 Age-related osteoporosis without current pathological fracture: Secondary | ICD-10-CM | POA: Diagnosis not present

## 2023-06-21 DIAGNOSIS — R945 Abnormal results of liver function studies: Secondary | ICD-10-CM | POA: Diagnosis not present

## 2023-06-21 DIAGNOSIS — E1169 Type 2 diabetes mellitus with other specified complication: Secondary | ICD-10-CM | POA: Diagnosis not present

## 2023-06-21 DIAGNOSIS — E162 Hypoglycemia, unspecified: Secondary | ICD-10-CM | POA: Diagnosis not present

## 2023-06-21 DIAGNOSIS — E875 Hyperkalemia: Secondary | ICD-10-CM | POA: Diagnosis not present

## 2023-06-27 DIAGNOSIS — E1165 Type 2 diabetes mellitus with hyperglycemia: Secondary | ICD-10-CM | POA: Diagnosis not present

## 2023-06-28 ENCOUNTER — Ambulatory Visit: Payer: Medicare PPO | Attending: Cardiovascular Disease | Admitting: *Deleted

## 2023-06-28 DIAGNOSIS — Z5181 Encounter for therapeutic drug level monitoring: Secondary | ICD-10-CM

## 2023-06-28 DIAGNOSIS — Z952 Presence of prosthetic heart valve: Secondary | ICD-10-CM

## 2023-06-28 LAB — POCT INR: INR: 3.9 — AB (ref 2.0–3.0)

## 2023-06-28 NOTE — Patient Instructions (Signed)
 Take warfarin 1/2 tablet tonight then decrease dose to 1 tablet daily except 1 1/2 tablets on Sundays, Tuesdays and Thursdays. Eat extra greens today -Recheck INR in 3 wks

## 2023-07-19 ENCOUNTER — Ambulatory Visit: Attending: Cardiovascular Disease | Admitting: *Deleted

## 2023-07-19 DIAGNOSIS — Z5181 Encounter for therapeutic drug level monitoring: Secondary | ICD-10-CM | POA: Diagnosis not present

## 2023-07-19 DIAGNOSIS — Z952 Presence of prosthetic heart valve: Secondary | ICD-10-CM | POA: Diagnosis not present

## 2023-07-19 LAB — POCT INR: INR: 3.8 — AB (ref 2.0–3.0)

## 2023-07-19 NOTE — Patient Instructions (Signed)
 Take warfarin 1/2 tablet tonight then resume 1 tablet daily except 1 1/2 tablets on Sundays, Tuesdays and Thursdays. Eat extra greens today -Recheck INR in 2 wks

## 2023-07-22 DIAGNOSIS — Z961 Presence of intraocular lens: Secondary | ICD-10-CM | POA: Diagnosis not present

## 2023-07-25 NOTE — Progress Notes (Unsigned)
 Referring Provider: [name], MD Primary Care Physician:  [name], MD Primary GI Physician: Dr. Levon Hedger  No chief complaint on file.   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 male  with history of GERD, choledocholithiasis in April 2024 s/p ERCP and laparoscopic cholecystectomy, he did well after that until July when he developed an episode of upper abdominal pain/nausea/vomiting that spontaneously resolved after 48 hours, but had recurrent symptoms on 9/3 and ultimately found to have pancreatitis. He had been on Rybelsus, but this was discontinued on 8/26 due to early satiety.  It was felt pancreatitis may have been secondary to Rybelsus though he was also found to have elevated triglycerides at 475.  IgG4 was within normal limits.    Patient had done well off Rybelsus until November 2024 when he again developed acute onset epigastric abdominal pain, nausea, vomiting, but symptoms overall resolved within 24 hours.  He had been started on Farxiga about 3 days prior.  Dr. Marline Backbone. reviewed and recommended avoiding Marcelline Deist as there had been a few reports of Farxiga related to acute pancreatitis.  Planned for labs, dedicated pancreatic imaging in about 6 weeks.  Also advised that he could increase omeprazole to 40 mg twice daily.  Labs with lipase 716, WBC elevated at 19.5, BUN, calcium, hematocrit elevated, likely related to pancreatitis/hemoconcentration.  Recommended ER evaluation, IV fluids.  Patient declined as he was feeling much better.  Patient stated he would discuss with his PCP.  Patient also stated that he discussed [place] with his PCP who did not agree with stopping this medication.   CT with pancreatic protocol 04/19/2023 with normal-appearing pancreas.  Persistent nodular soft tissue fullness in the proximal duodenum.  Noted this area was under distended and evaluation limited, but recommended EGD for further evaluation, but EGD was placed on hold due to hernia  surgery.    Today:    ?Farxiga, EUS, EGD  Past Medical History:  Diagnosis Date   ALLERGIC RHINITIS    Arthritis    Cancer (HCC)    skin lesion on right thigh   GERD (gastroesophageal reflux disease)    H/O mitral valve replacement    10 yrs ago   HYPERLIPIDEMIA    Hypertension    Long term current use of anticoagulant    MITRAL VALVE PROLAPSE    Pre-diabetes    PREMATURE VENTRICULAR CONTRACTIONS     Past Surgical History:  Procedure Laterality Date   APPENDECTOMY     CHOLECYSTECTOMY N/A 08/17/2022   Procedure: LAPAROSCOPIC CHOLECYSTECTOMY, NO CHOLANGIOGRAM;  Surgeon: [name], MD;  Location: [facility];  Service: General;  Laterality: N/A;   COLONOSCOPY N/A 03/29/2014   Procedure: COLONOSCOPY;  Surgeon: Malissa Hippo, MD;  [facility]: [facility];  Service: Endoscopy;  Laterality: N/A;  855   COLONOSCOPY N/A 06/01/2019   Rehman: ileum normal, diverticulosis in entire colon, external hemorhoids, no specimens   ERCP N/A 08/16/2022   Procedure: ENDOSCOPIC RETROGRADE CHOLANGIOPANCREATOGRAPHY (ERCP);  Surgeon: Kerin Salen, MD;  Location: [facility];  Service: Gastroenterology;  Laterality: N/A;   ESOPHAGOGASTRODUODENOSCOPY N/A 03/29/2014   Procedure: ESOPHAGOGASTRODUODENOSCOPY (EGD);  Surgeon: Malissa Hippo, MD;  [facility]: [facility];  Service: Endoscopy;  Laterality: N/A;   EYE SURGERY Bilateral 2024   cataract extraction w/ IOL   INGUINAL HERNIA REPAIR Left 05/19/2023   Procedure: LEFT OPEN INGUINAL HERNIA REPAIR WITH MESH;  Surgeon: Kinsinger, De Blanch, MD;  Location: [facility];  Service: General;  Laterality: Left;   LITHOTRIPSY  08/16/2022   Procedure: LITHOTRIPSY;  Surgeon: Kerin Salen, MD;  Location: [facility];  Service: Gastroenterology;;   MITRAL VALVE REPLACEMENT  04/07/1999   done in [place]   REMOVAL OF STONES  08/16/2022   Procedure: REMOVAL OF STONES;  Surgeon: Kerin Salen, MD;  Location: [facility];  Service: Gastroenterology;;   SHOULDER  SURGERY Left 1970   arthroscopy   SPHINCTEROTOMY  08/16/2022   Procedure: Dennison Mascot;  Surgeon: Kerin Salen, MD;  Location: [facility];  Service: Gastroenterology;;    Current Outpatient Medications  Medication Sig Dispense Refill   AFRIN NASAL SPRAY 0.05 % nasal spray Place 1 spray into both nostrils 2 (two) times daily as needed for congestion.     allopurinol (ZYLOPRIM) 300 MG tablet Take 300 mg by mouth daily.     colchicine 0.6 MG tablet Take 0.6 mg by mouth daily as needed (gout flares).     enoxaparin (LOVENOX) 80 MG/0.8ML injection Inject 0.8 mLs (80 mg total) into the skin every 12 (twelve) hours. 10 syringes 8 mL 0   ezetimibe-simvastatin (VYTORIN) 10-20 MG tablet Take 1 tablet by mouth at bedtime.     FARXIGA 10 MG TABS tablet Take 10 mg by mouth daily.     guaiFENesin (MUCINEX) 600 MG 12 hr tablet Take 600 mg by mouth 2 (two) times daily as needed for cough or to loosen phlegm.     icosapent Ethyl (VASCEPA) 1 g capsule Take 2 g by mouth 2 (two) times daily.     lisinopril (PRINIVIL,ZESTRIL) 40 MG tablet Take 40 mg by mouth daily.     metFORMIN (GLUCOPHAGE) 500 MG tablet Take 500 mg by mouth in the morning and at bedtime.     nebivolol (BYSTOLIC) 5 MG tablet Take 5 mg by mouth in the morning and at bedtime.     NEXLETOL 180 MG TABS Take 180 mg by mouth daily.     omeprazole (PRILOSEC) 40 MG capsule TAKE 1 CAPSULE(40 MG) BY MOUTH DAILY 90 capsule 2   OVER THE COUNTER MEDICATION Take 1-2 tablets by mouth See admin instructions. TYLENOL Sinus + Headache Non-Drowsy Daytime Caplets for Nasal Congestion, Sinus Pressure & Pain Relief- Take 1-2 caplets by mouth every six hours as needed for migraines     oxyCODONE (OXY IR/ROXICODONE) 5 MG immediate release tablet Take 1 tablet (5 mg total) by mouth every 6 (six) hours as needed for severe pain (pain score 7-10). 15 tablet 0   predniSONE (DELTASONE) 10 MG tablet Take 10 mg by mouth daily as needed (gout flares).     Psyllium  (METAMUCIL PO) Take 4 capsules by mouth daily with breakfast.     warfarin (COUMADIN) 5 MG tablet TAKE 1 TO 2 TABLETS BY MOUTH EVERY DAY AS DIRECTED BY COUMADIN CLINIC 45 tablet 5   No current facility-administered medications for this visit.    Allergies as of 07/27/2023 - Review Complete 05/19/2023  Allergen Reaction Noted   Erythromycin Other (See Comments) 02/18/2012   Sulfamethoxazole Other (See Comments) 12/17/2013   Vytorin [ezetimibe-simvastatin] Other (See Comments) 08/12/2022    No family history on file.  Social History   Socioeconomic History   Marital status: Married    Spouse name: Not on file   Number of children: Not on file   Years of education: Not on file   Highest education level: Not on file  Occupational History   Not on file  Tobacco Use   Smoking status: Never  Smokeless tobacco: Never  Vaping Use   Vaping status: Never Used  Substance and Sexual Activity   Alcohol use: Yes    Comment: occ   Drug use: No   Sexual activity: Not Currently  Other Topics Concern   Not on file  Social History Narrative   Not on file   Social Drivers of Health   Financial Resource Strain: Not on file  Food Insecurity: No Food Insecurity (05/19/2023)   Hunger Vital Sign    Worried About Running Out of Food in the Last Year: Never true    Ran Out of Food in the Last Year: Never true  Transportation Needs: No Transportation Needs (05/19/2023)   PRAPARE - Administrator, Civil Service (Medical): No    Lack of Transportation (Non-Medical): No  Physical Activity: Not on file  Stress: Not on file  Social Connections: Socially Integrated (05/19/2023)   Social Connection and Isolation Panel [NHANES]    Frequency of Communication with Friends and Family: More than three times a week    Frequency of Social Gatherings with Friends and Family: More than three times a week    Attends Religious Services: More than 4 times per year    Active Member of [employer] or  Organizations: Yes    Attends Engineer, structural: More than 4 times per year    Marital Status: Married    Review of Systems: Gen: Denies fever, chills, anorexia. Denies fatigue, weakness, weight loss.  CV: Denies chest pain, palpitations, syncope, peripheral edema, and claudication. Resp: Denies dyspnea at rest, cough, wheezing, coughing up blood, and pleurisy. GI: Denies vomiting blood, jaundice, and fecal incontinence.   Denies dysphagia or odynophagia. Derm: Denies rash, itching, dry skin Psych: Denies depression, anxiety, memory loss, confusion. No homicidal or suicidal ideation.  Heme: Denies bruising, bleeding, and enlarged lymph nodes.  Physical Exam: There were no vitals taken for this visit. General:   Alert and oriented. No distress noted. Pleasant and cooperative.  Head:  Normocephalic and atraumatic. Eyes:  Conjuctiva clear without scleral icterus. Heart:  S1, S2 present without murmurs appreciated. Lungs:  Clear to auscultation bilaterally. No wheezes, rales, or rhonchi. No distress.  Abdomen:  +BS, soft, non-tender and non-distended. No rebound or guarding. No HSM or masses noted. Msk:  Symmetrical without gross deformities. Normal posture. Extremities:  Without edema. Neurologic:  Alert and  oriented x4 Psych:  Normal mood and affect.    Assessment:     Plan:  ***   Ermalinda Memos, PA-C [facility] 07/27/2023

## 2023-07-27 ENCOUNTER — Ambulatory Visit: Payer: Medicare PPO | Admitting: Gastroenterology

## 2023-07-27 ENCOUNTER — Encounter: Payer: Self-pay | Admitting: Gastroenterology

## 2023-07-27 VITALS — BP 127/78 | HR 74 | Temp 97.7°F | Ht 73.0 in | Wt 191.2 lb

## 2023-07-27 DIAGNOSIS — Z8619 Personal history of other infectious and parasitic diseases: Secondary | ICD-10-CM

## 2023-07-27 DIAGNOSIS — K219 Gastro-esophageal reflux disease without esophagitis: Secondary | ICD-10-CM | POA: Diagnosis not present

## 2023-07-27 DIAGNOSIS — R933 Abnormal findings on diagnostic imaging of other parts of digestive tract: Secondary | ICD-10-CM

## 2023-07-27 NOTE — Patient Instructions (Signed)
 Continue taking omeprazole 40 mg daily.  Let us know when you are ready to schedule your upper endoscopy.  I will plan for a routine follow-up in 6 months, but we will certainly see you sooner if needed.  It was great to see you today!  I am glad you are doing well overall!  Ermalinda Memos, PA-C [facility]

## 2023-07-28 DIAGNOSIS — E1165 Type 2 diabetes mellitus with hyperglycemia: Secondary | ICD-10-CM | POA: Diagnosis not present

## 2023-08-03 ENCOUNTER — Ambulatory Visit: Attending: Cardiovascular Disease | Admitting: *Deleted

## 2023-08-03 DIAGNOSIS — Z5181 Encounter for therapeutic drug level monitoring: Secondary | ICD-10-CM | POA: Diagnosis not present

## 2023-08-03 DIAGNOSIS — Z952 Presence of prosthetic heart valve: Secondary | ICD-10-CM | POA: Diagnosis not present

## 2023-08-03 LAB — POCT INR: INR: 4.1 — AB (ref 2.0–3.0)

## 2023-08-03 NOTE — Patient Instructions (Signed)
 Hold warfarin tonight then decrease dose to 1 tablet daily except 1 1/2 tablets on Sundays and Thursdays. Eat extra greens today -Recheck INR in 2 wks

## 2023-08-11 DIAGNOSIS — N401 Enlarged prostate with lower urinary tract symptoms: Secondary | ICD-10-CM | POA: Diagnosis not present

## 2023-08-11 DIAGNOSIS — Z8042 Family history of malignant neoplasm of prostate: Secondary | ICD-10-CM | POA: Diagnosis not present

## 2023-08-11 DIAGNOSIS — R972 Elevated prostate specific antigen [PSA]: Secondary | ICD-10-CM | POA: Diagnosis not present

## 2023-08-11 DIAGNOSIS — N138 Other obstructive and reflux uropathy: Secondary | ICD-10-CM | POA: Diagnosis not present

## 2023-08-17 ENCOUNTER — Ambulatory Visit: Attending: Cardiovascular Disease

## 2023-08-17 DIAGNOSIS — Z7901 Long term (current) use of anticoagulants: Secondary | ICD-10-CM | POA: Diagnosis not present

## 2023-08-17 DIAGNOSIS — Z952 Presence of prosthetic heart valve: Secondary | ICD-10-CM

## 2023-08-17 DIAGNOSIS — Z5181 Encounter for therapeutic drug level monitoring: Secondary | ICD-10-CM

## 2023-08-17 DIAGNOSIS — I059 Rheumatic mitral valve disease, unspecified: Secondary | ICD-10-CM | POA: Diagnosis not present

## 2023-08-17 LAB — POCT INR: INR: 2.8 (ref 2.0–3.0)

## 2023-08-17 NOTE — Patient Instructions (Signed)
 Description   Continue on same dosage of Warfarin 1 tablet daily except 1 1/2 tablets on Sundays and Thursdays. Eat extra greens today -Recheck INR in 3 wks

## 2023-08-20 ENCOUNTER — Other Ambulatory Visit (INDEPENDENT_AMBULATORY_CARE_PROVIDER_SITE_OTHER): Payer: Self-pay | Admitting: Gastroenterology

## 2023-08-27 DIAGNOSIS — E1165 Type 2 diabetes mellitus with hyperglycemia: Secondary | ICD-10-CM | POA: Diagnosis not present

## 2023-09-07 ENCOUNTER — Ambulatory Visit: Attending: Cardiovascular Disease | Admitting: *Deleted

## 2023-09-07 DIAGNOSIS — Z952 Presence of prosthetic heart valve: Secondary | ICD-10-CM | POA: Diagnosis not present

## 2023-09-07 DIAGNOSIS — Z5181 Encounter for therapeutic drug level monitoring: Secondary | ICD-10-CM

## 2023-09-07 LAB — POCT INR: INR: 4.1 — AB (ref 2.0–3.0)

## 2023-09-07 NOTE — Patient Instructions (Signed)
 Hold warfarin tonight then resume 1 tablet daily except 1 1/2 tablets on Sundays and Thursdays. Eat extra greens today -Recheck INR in 3 wks

## 2023-09-15 ENCOUNTER — Telehealth: Payer: Self-pay | Admitting: Cardiovascular Disease

## 2023-09-15 NOTE — Telephone Encounter (Signed)
 Called pt to see if he wanted to schedule CT ordered in January. He is wanting someone to explain the testing to him. He wants to know what they will do and what information the test will provide. Pt is also asking if Dr. Stann Earnest is strongly advising he have the test done or is it just an option. Please advise.

## 2023-09-16 NOTE — Telephone Encounter (Signed)
 Pt returning nurse call

## 2023-09-16 NOTE — Telephone Encounter (Signed)
 Spoke with pt and relayed Dr. Francie Irani msg. Pt states that he would like to ponder on this over the weekend and call us  back on Monday.

## 2023-09-16 NOTE — Telephone Encounter (Signed)
 Returned call to pt. No answer. Left msg to call back.

## 2023-09-27 DIAGNOSIS — E1165 Type 2 diabetes mellitus with hyperglycemia: Secondary | ICD-10-CM | POA: Diagnosis not present

## 2023-09-28 ENCOUNTER — Ambulatory Visit: Attending: Cardiovascular Disease | Admitting: *Deleted

## 2023-09-28 DIAGNOSIS — Z5181 Encounter for therapeutic drug level monitoring: Secondary | ICD-10-CM

## 2023-09-28 DIAGNOSIS — Z952 Presence of prosthetic heart valve: Secondary | ICD-10-CM

## 2023-09-28 DIAGNOSIS — E782 Mixed hyperlipidemia: Secondary | ICD-10-CM | POA: Diagnosis not present

## 2023-09-28 DIAGNOSIS — M1A00X0 Idiopathic chronic gout, unspecified site, without tophus (tophi): Secondary | ICD-10-CM | POA: Diagnosis not present

## 2023-09-28 DIAGNOSIS — E1169 Type 2 diabetes mellitus with other specified complication: Secondary | ICD-10-CM | POA: Diagnosis not present

## 2023-09-28 LAB — LAB REPORT - SCANNED
A1c: 6.7
Albumin, Urine POC: 4.3
Creatinine, POC: 60.8 mg/dL
EGFR: 75
Microalb Creat Ratio: 7

## 2023-09-28 LAB — POCT INR: INR: 3.7 — AB (ref 2.0–3.0)

## 2023-09-28 NOTE — Patient Instructions (Signed)
 Decrease warfarin to 1 tablet daily except 1 1/2 tablets on Sundays. Eat extra greens today -Recheck INR in 3 wks

## 2023-10-04 DIAGNOSIS — E875 Hyperkalemia: Secondary | ICD-10-CM | POA: Diagnosis not present

## 2023-10-04 DIAGNOSIS — M542 Cervicalgia: Secondary | ICD-10-CM | POA: Diagnosis not present

## 2023-10-04 DIAGNOSIS — M1A00X0 Idiopathic chronic gout, unspecified site, without tophus (tophi): Secondary | ICD-10-CM | POA: Diagnosis not present

## 2023-10-04 DIAGNOSIS — E1169 Type 2 diabetes mellitus with other specified complication: Secondary | ICD-10-CM | POA: Diagnosis not present

## 2023-10-04 DIAGNOSIS — I10 Essential (primary) hypertension: Secondary | ICD-10-CM | POA: Diagnosis not present

## 2023-10-04 DIAGNOSIS — R945 Abnormal results of liver function studies: Secondary | ICD-10-CM | POA: Diagnosis not present

## 2023-10-04 DIAGNOSIS — E782 Mixed hyperlipidemia: Secondary | ICD-10-CM | POA: Diagnosis not present

## 2023-10-04 DIAGNOSIS — K219 Gastro-esophageal reflux disease without esophagitis: Secondary | ICD-10-CM | POA: Diagnosis not present

## 2023-10-19 ENCOUNTER — Ambulatory Visit: Attending: Cardiovascular Disease | Admitting: *Deleted

## 2023-10-19 DIAGNOSIS — Z5181 Encounter for therapeutic drug level monitoring: Secondary | ICD-10-CM

## 2023-10-19 DIAGNOSIS — Z952 Presence of prosthetic heart valve: Secondary | ICD-10-CM

## 2023-10-19 LAB — POCT INR: INR: 3.1 — AB (ref 2.0–3.0)

## 2023-10-19 NOTE — Progress Notes (Signed)
Please see anticoagulation encounter.

## 2023-10-19 NOTE — Patient Instructions (Signed)
 Continue warfarin 1 tablet daily except 1 1/2 tablets on Sundays. Continue greens -Recheck INR in 4 wks

## 2023-10-27 DIAGNOSIS — E1165 Type 2 diabetes mellitus with hyperglycemia: Secondary | ICD-10-CM | POA: Diagnosis not present

## 2023-11-10 DIAGNOSIS — R972 Elevated prostate specific antigen [PSA]: Secondary | ICD-10-CM | POA: Diagnosis not present

## 2023-11-16 ENCOUNTER — Ambulatory Visit: Attending: Cardiovascular Disease | Admitting: *Deleted

## 2023-11-16 DIAGNOSIS — Z952 Presence of prosthetic heart valve: Secondary | ICD-10-CM | POA: Diagnosis not present

## 2023-11-16 DIAGNOSIS — Z5181 Encounter for therapeutic drug level monitoring: Secondary | ICD-10-CM

## 2023-11-16 LAB — POCT INR: INR: 2.8 (ref 2.0–3.0)

## 2023-11-16 NOTE — Progress Notes (Signed)
 INR-2.8 Please see anticoagulation encounter

## 2023-11-16 NOTE — Patient Instructions (Signed)
 Continue warfarin 1 tablet daily except 1 1/2 tablets on Sundays. Continue greens -Recheck INR in 4 wks

## 2023-11-17 DIAGNOSIS — R490 Dysphonia: Secondary | ICD-10-CM | POA: Diagnosis not present

## 2023-11-17 DIAGNOSIS — Z8042 Family history of malignant neoplasm of prostate: Secondary | ICD-10-CM | POA: Diagnosis not present

## 2023-11-17 DIAGNOSIS — N529 Male erectile dysfunction, unspecified: Secondary | ICD-10-CM | POA: Diagnosis not present

## 2023-11-17 DIAGNOSIS — R972 Elevated prostate specific antigen [PSA]: Secondary | ICD-10-CM | POA: Diagnosis not present

## 2023-11-17 DIAGNOSIS — N401 Enlarged prostate with lower urinary tract symptoms: Secondary | ICD-10-CM | POA: Diagnosis not present

## 2023-11-17 DIAGNOSIS — N138 Other obstructive and reflux uropathy: Secondary | ICD-10-CM | POA: Diagnosis not present

## 2023-11-27 DIAGNOSIS — E1165 Type 2 diabetes mellitus with hyperglycemia: Secondary | ICD-10-CM | POA: Diagnosis not present

## 2023-12-14 ENCOUNTER — Ambulatory Visit: Attending: Cardiovascular Disease | Admitting: *Deleted

## 2023-12-14 DIAGNOSIS — Z952 Presence of prosthetic heart valve: Secondary | ICD-10-CM | POA: Diagnosis not present

## 2023-12-14 DIAGNOSIS — Z5181 Encounter for therapeutic drug level monitoring: Secondary | ICD-10-CM | POA: Diagnosis not present

## 2023-12-14 LAB — POCT INR: INR: 2.7 (ref 2.0–3.0)

## 2023-12-14 NOTE — Patient Instructions (Signed)
 Continue warfarin 1 tablet daily except 1 1/2 tablets on Sundays. Continue greens -Recheck INR in 6 wks

## 2023-12-14 NOTE — Progress Notes (Signed)
 INR 2.7; Please see anticoagulation encounter

## 2023-12-28 DIAGNOSIS — E1165 Type 2 diabetes mellitus with hyperglycemia: Secondary | ICD-10-CM | POA: Diagnosis not present

## 2024-01-03 ENCOUNTER — Encounter: Payer: Self-pay | Admitting: Gastroenterology

## 2024-01-12 ENCOUNTER — Other Ambulatory Visit: Payer: Self-pay

## 2024-01-12 MED ORDER — WARFARIN SODIUM 5 MG PO TABS: ORAL_TABLET | ORAL | 5 refills | Status: AC

## 2024-01-13 DIAGNOSIS — E1169 Type 2 diabetes mellitus with other specified complication: Secondary | ICD-10-CM | POA: Diagnosis not present

## 2024-01-13 DIAGNOSIS — M1A00X0 Idiopathic chronic gout, unspecified site, without tophus (tophi): Secondary | ICD-10-CM | POA: Diagnosis not present

## 2024-01-13 DIAGNOSIS — E782 Mixed hyperlipidemia: Secondary | ICD-10-CM | POA: Diagnosis not present

## 2024-01-14 LAB — LAB REPORT - SCANNED
A1c: 6.5
Albumin, Urine POC: 24.3
Albumin/Creatinine Ratio, Urine, POC: 19
Creatinine, POC: 127.5 mg/dL
EGFR: 80

## 2024-01-16 DIAGNOSIS — Z23 Encounter for immunization: Secondary | ICD-10-CM | POA: Diagnosis not present

## 2024-01-16 DIAGNOSIS — M1A00X0 Idiopathic chronic gout, unspecified site, without tophus (tophi): Secondary | ICD-10-CM | POA: Diagnosis not present

## 2024-01-16 DIAGNOSIS — K219 Gastro-esophageal reflux disease without esophagitis: Secondary | ICD-10-CM | POA: Diagnosis not present

## 2024-01-16 DIAGNOSIS — M542 Cervicalgia: Secondary | ICD-10-CM | POA: Diagnosis not present

## 2024-01-16 DIAGNOSIS — E1169 Type 2 diabetes mellitus with other specified complication: Secondary | ICD-10-CM | POA: Diagnosis not present

## 2024-01-16 DIAGNOSIS — R945 Abnormal results of liver function studies: Secondary | ICD-10-CM | POA: Diagnosis not present

## 2024-01-16 DIAGNOSIS — E875 Hyperkalemia: Secondary | ICD-10-CM | POA: Diagnosis not present

## 2024-01-16 DIAGNOSIS — I10 Essential (primary) hypertension: Secondary | ICD-10-CM | POA: Diagnosis not present

## 2024-01-23 DIAGNOSIS — H02403 Unspecified ptosis of bilateral eyelids: Secondary | ICD-10-CM | POA: Diagnosis not present

## 2024-01-23 DIAGNOSIS — E119 Type 2 diabetes mellitus without complications: Secondary | ICD-10-CM | POA: Diagnosis not present

## 2024-01-23 DIAGNOSIS — H5213 Myopia, bilateral: Secondary | ICD-10-CM | POA: Diagnosis not present

## 2024-01-25 ENCOUNTER — Ambulatory Visit: Attending: Cardiovascular Disease | Admitting: *Deleted

## 2024-01-25 DIAGNOSIS — Z952 Presence of prosthetic heart valve: Secondary | ICD-10-CM | POA: Diagnosis not present

## 2024-01-25 DIAGNOSIS — Z5181 Encounter for therapeutic drug level monitoring: Secondary | ICD-10-CM

## 2024-01-25 LAB — POCT INR: INR: 3 (ref 2.0–3.0)

## 2024-01-25 NOTE — Patient Instructions (Signed)
 Continue warfarin 1 tablet daily except 1 1/2 tablets on Sundays. Continue greens -Recheck INR in 6 wks

## 2024-01-25 NOTE — Progress Notes (Signed)
 INR 3.0; Please see anticoagulation encounter

## 2024-01-27 DIAGNOSIS — E1165 Type 2 diabetes mellitus with hyperglycemia: Secondary | ICD-10-CM | POA: Diagnosis not present

## 2024-02-01 ENCOUNTER — Encounter (INDEPENDENT_AMBULATORY_CARE_PROVIDER_SITE_OTHER): Payer: Self-pay | Admitting: Gastroenterology

## 2024-02-13 DIAGNOSIS — H04123 Dry eye syndrome of bilateral lacrimal glands: Secondary | ICD-10-CM | POA: Diagnosis not present

## 2024-02-26 NOTE — Progress Notes (Unsigned)
 Patient ID: Luis Luis [name], PhD, male   DOB: Jan 03, 1951, 73 y.o.   MRN: [id number]     Luis Haynes is seen today in followup for his hypertension and mitral  valve replacement.   INR;s have been Rx  There's been no bleeding diathesis. He's been following his SBE prophylaxis. He's been compliant with his blood pressure medication. Otherwise he's not had any significant chest pain PND or orthopnea. Has been no TIA or CVA. Has been no signs of heart failure Has ? venous insuficiency with pain in LE;s that has been markedly improved with support hose   Echo 08/15/20 EF 60-65% St Jude valve normal trivial MR diastolic gradient 2 mmHg LA 3.7 cm   2015 Had colonoscopy with lovenox  bridge with Luis Haynes 05/31/19 had rectal bleed started on cipro /flagyl  for ? Diverticulitis colonoscopy with no  Active bleeding or lesions    Enjoys cooking shows with wife.  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  All other systems reviewed and negative  General: There were no vitals taken for this visit.  Affect appropriate Healthy:  appears stated age HEENT: normal Neck supple with no adenopathy JVP normal no bruits no thyromegaly Lungs clear with no wheezing and good diaphragmatic motion Heart:  S1 click /S2 midl MR  murmur, no  rub, gallop or click PMI normal Abdomen: benighn, BS positve, no tenderness, no AAA no bruit.  No HSM or HJR Left inguinal surgery repair Distal pulses intact with no bruits No edema Neuro non-focal Skin warm and dry No muscular weakness   Current Outpatient Medications  Medication Sig Dispense Refill   AFRIN NASAL SPRAY 0.05 % nasal spray Place 1 spray into both nostrils 2 (two) times daily as needed for congestion.     allopurinol  (ZYLOPRIM ) 300 MG tablet Take 300 mg by mouth daily.     colchicine  0.6 MG tablet Take 0.6 mg by mouth daily as needed (gout flares).     ezetimibe -simvastatin  (VYTORIN ) 10-20 MG tablet Take 1 tablet by mouth at bedtime.     FARXIGA  10 MG TABS tablet Take 10 mg by mouth daily.     guaiFENesin  (MUCINEX ) 600 MG 12 hr tablet Take 600 mg by mouth 2 (two) times daily as needed for cough or to loosen phlegm.     icosapent  Ethyl (VASCEPA ) 1 g capsule Take 2 g by mouth 2 (two) times daily.     lisinopril  (PRINIVIL ,ZESTRIL ) 40 MG tablet Take 40 mg by mouth daily.     metFORMIN  (GLUCOPHAGE ) 500 MG tablet Take 500 mg by mouth in the morning and at bedtime.     nebivolol  (BYSTOLIC ) 5 MG tablet Take 5 mg by mouth in the morning and at bedtime.     NEXLETOL  180 MG TABS Take 180 mg by mouth daily.     omeprazole  (PRILOSEC)  40 MG capsule TAKE 1 CAPSULE(40 MG) BY MOUTH DAILY 90 capsule 2   OVER THE COUNTER MEDICATION Take 1-2 tablets by mouth See admin instructions. TYLENOL  Sinus + Headache Non-Drowsy Daytime Caplets for Nasal Congestion, Sinus Pressure & Pain Relief- Take 1-2 caplets by mouth every six hours as needed for migraines     Psyllium (METAMUCIL PO) Take 4 capsules by mouth daily with breakfast.     warfarin (COUMADIN ) 5 MG tablet TAKE 1 TO 2 TABLETS BY MOUTH EVERY DAY AS DIRECTED BY COUMADIN  CLINIC 45 tablet 5   No current facility-administered medications for this visit.    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

## 2024-02-27 DIAGNOSIS — E1165 Type 2 diabetes mellitus with hyperglycemia: Secondary | ICD-10-CM | POA: Diagnosis not present

## 2024-02-29 ENCOUNTER — Encounter: Payer: Self-pay | Admitting: Cardiovascular Disease

## 2024-02-29 ENCOUNTER — Ambulatory Visit: Attending: Cardiovascular Disease | Admitting: Cardiovascular Disease

## 2024-02-29 VITALS — BP 136/82 | Ht 73.0 in | Wt 199.0 lb

## 2024-02-29 DIAGNOSIS — Z5181 Encounter for therapeutic drug level monitoring: Secondary | ICD-10-CM | POA: Diagnosis not present

## 2024-02-29 DIAGNOSIS — Z7901 Long term (current) use of anticoagulants: Secondary | ICD-10-CM

## 2024-02-29 DIAGNOSIS — Z952 Presence of prosthetic heart valve: Secondary | ICD-10-CM | POA: Diagnosis not present

## 2024-02-29 NOTE — Patient Instructions (Signed)
 Medication Instructions:  Your physician recommends that you continue on your current medications as directed. Please refer to the Current Medication list given to you today.  *If you need a refill on your cardiac medications before your next appointment, please call your pharmacy*  Lab Work: NONE   If you have labs (blood work) drawn today and your tests are completely normal, you will receive your results only by: MyChart Message (if you have MyChart) OR A paper copy in the mail If you have any lab test that is abnormal or we need to change your treatment, we will call you to review the results.  Testing/Procedures: Your physician has requested that you have an echocardiogram. Echocardiography is a painless test that uses sound waves to create images of your heart. It provides your doctor with information about the size and shape of your heart and how well your heart's chambers and valves are working. This procedure takes approximately one hour. There are no restrictions for this procedure. Please do NOT wear cologne, perfume, aftershave, or lotions (deodorant is allowed). Please arrive 15 minutes prior to your appointment time.  Please note: We ask at that you not bring children with you during ultrasound (echo/ vascular) testing. Due to room size and safety concerns, children are not allowed in the ultrasound rooms during exams. Our front office staff cannot provide observation of children in our lobby area while testing is being conducted. An adult accompanying a patient to their appointment will only be allowed in the ultrasound room at the discretion of the ultrasound technician under special circumstances. We apologize for any inconvenience.   Follow-Up: At [facility], you and your health needs are our priority.  As part of our continuing mission to provide you with exceptional heart care, our providers are all part of one team.  This team includes your primary Cardiologist  (physician) and Advanced Practice Providers or APPs (Physician Assistants and Nurse Practitioners) who all work together to provide you with the care you need, when you need it.  Your next appointment:   1  year(s)  Provider:   Janelle Mediate, MD    We recommend signing up for the patient portal called "MyChart".  Sign up information is provided on this After Visit Summary.  MyChart is used to connect with patients for Virtual Visits (Telemedicine).  Patients are able to view lab/test results, encounter notes, upcoming appointments, etc.  Non-urgent messages can be sent to your provider as well.   To learn more about what you can do with MyChart, go to [email or web address].   Other Instructions Thank you for choosing [facility]!

## 2024-03-06 ENCOUNTER — Ambulatory Visit (HOSPITAL_COMMUNITY)
Admission: RE | Admit: 2024-03-06 | Discharge: 2024-03-06 | Disposition: A | Discharge status: Home / Self Care | Source: Ambulatory Visit | Attending: Cardiovascular Disease | Admitting: Cardiovascular Disease

## 2024-03-06 ENCOUNTER — Ambulatory Visit: Payer: Self-pay | Admitting: Cardiovascular Disease

## 2024-03-06 DIAGNOSIS — Z952 Presence of prosthetic heart valve: Secondary | ICD-10-CM | POA: Insufficient documentation

## 2024-03-06 LAB — ECHOCARDIOGRAM COMPLETE
AR max vel: 2.6 cm2
AV Area VTI: 2.29 cm2
AV Area mean vel: 2.43 cm2
AV Mean grad: 4 mmHg
AV Peak grad: 7.5 mmHg
Ao pk vel: 1.37 m/s
Area-P 1/2: 2.66 cm2
MV VTI: 1.76 cm2
P 1/2 time: 760 ms
S' Lateral: 3.7 cm

## 2024-03-06 NOTE — Progress Notes (Signed)
*  PRELIMINARY RESULTS* Echocardiogram 2D Echocardiogram has been performed.  Luis Haynes 03/06/2024, 3:05 PM

## 2024-03-07 ENCOUNTER — Ambulatory Visit: Attending: Cardiovascular Disease | Admitting: *Deleted

## 2024-03-07 DIAGNOSIS — Z952 Presence of prosthetic heart valve: Secondary | ICD-10-CM | POA: Diagnosis not present

## 2024-03-07 DIAGNOSIS — Z5181 Encounter for therapeutic drug level monitoring: Secondary | ICD-10-CM

## 2024-03-07 LAB — POCT INR: INR: 3.2 — AB (ref 2.0–3.0)

## 2024-03-07 NOTE — Progress Notes (Signed)
 INR 3.2 Please see anticoagulation encounter

## 2024-03-07 NOTE — Patient Instructions (Signed)
 Continue warfarin 1 tablet daily except 1 1/2 tablets on Sundays. Continue greens -Recheck INR in 6 wks

## 2024-04-18 ENCOUNTER — Ambulatory Visit: Attending: Cardiovascular Disease | Admitting: *Deleted

## 2024-04-18 DIAGNOSIS — Z5181 Encounter for therapeutic drug level monitoring: Secondary | ICD-10-CM | POA: Diagnosis not present

## 2024-04-18 DIAGNOSIS — Z952 Presence of prosthetic heart valve: Secondary | ICD-10-CM

## 2024-04-18 LAB — POCT INR: INR: 3.6 — AB (ref 2.0–3.0)

## 2024-04-18 NOTE — Progress Notes (Signed)
 INR 3.6  Please see anticoagulation encounter

## 2024-04-18 NOTE — Patient Instructions (Signed)
 On doxycycline and Augment thru 04/20/24 Take warfarin 1/2 tablet tonight and tomorrow night then resume 1 tablet daily except 1 1/2 tablets on Sundays. Continue greens -Recheck INR in 2 wks

## 2024-05-02 ENCOUNTER — Ambulatory Visit: Attending: Cardiovascular Disease | Admitting: *Deleted

## 2024-05-02 DIAGNOSIS — Z5181 Encounter for therapeutic drug level monitoring: Secondary | ICD-10-CM | POA: Diagnosis not present

## 2024-05-02 DIAGNOSIS — Z952 Presence of prosthetic heart valve: Secondary | ICD-10-CM

## 2024-05-02 LAB — POCT INR: INR: 4 — AB (ref 2.0–3.0)

## 2024-05-02 NOTE — Progress Notes (Signed)
 INR 4.0; Please see anticoagulation encounter

## 2024-05-02 NOTE — Patient Instructions (Signed)
 Hold warfarin tonight then resume 1 tablet daily except 1 1/2 tablets on Sundays. Continue greens -Recheck INR in 2 wks

## 2024-05-17 ENCOUNTER — Ambulatory Visit: Admitting: *Deleted

## 2024-05-17 DIAGNOSIS — Z952 Presence of prosthetic heart valve: Secondary | ICD-10-CM

## 2024-05-17 DIAGNOSIS — Z5181 Encounter for therapeutic drug level monitoring: Secondary | ICD-10-CM | POA: Diagnosis not present

## 2024-05-17 LAB — POCT INR: INR: 3.4 — AB (ref 2.0–3.0)

## 2024-05-17 NOTE — Patient Instructions (Signed)
 Continue 1 tablet daily except 1 1/2 tablets on Sundays. Continue greens -Recheck INR in 4 wks

## 2024-05-17 NOTE — Progress Notes (Signed)
 INR 3.4

## 2024-06-13 ENCOUNTER — Ambulatory Visit
